# Patient Record
Sex: Female | Born: 1990 | Hispanic: Yes | Marital: Single | Smoking: Never smoker
Health system: Southern US, Community
[De-identification: ages and names within clinical notes are randomized; demographics above are authoritative.]

## PROBLEM LIST (undated history)

## (undated) ENCOUNTER — Inpatient Hospital Stay (HOSPITAL_COMMUNITY): Payer: Self-pay

## (undated) DIAGNOSIS — R519 Headache, unspecified: Secondary | ICD-10-CM

## (undated) DIAGNOSIS — K219 Gastro-esophageal reflux disease without esophagitis: Secondary | ICD-10-CM

## (undated) DIAGNOSIS — F329 Major depressive disorder, single episode, unspecified: Secondary | ICD-10-CM

## (undated) DIAGNOSIS — E559 Vitamin D deficiency, unspecified: Secondary | ICD-10-CM

## (undated) DIAGNOSIS — F32A Depression, unspecified: Secondary | ICD-10-CM

## (undated) DIAGNOSIS — E785 Hyperlipidemia, unspecified: Secondary | ICD-10-CM

## (undated) DIAGNOSIS — M549 Dorsalgia, unspecified: Secondary | ICD-10-CM

## (undated) DIAGNOSIS — G43011 Migraine without aura, intractable, with status migrainosus: Secondary | ICD-10-CM

## (undated) DIAGNOSIS — B999 Unspecified infectious disease: Secondary | ICD-10-CM

## (undated) DIAGNOSIS — G43019 Migraine without aura, intractable, without status migrainosus: Secondary | ICD-10-CM

## (undated) DIAGNOSIS — J309 Allergic rhinitis, unspecified: Secondary | ICD-10-CM

## (undated) DIAGNOSIS — M419 Scoliosis, unspecified: Secondary | ICD-10-CM

## (undated) DIAGNOSIS — L309 Dermatitis, unspecified: Secondary | ICD-10-CM

## (undated) DIAGNOSIS — Z3480 Encounter for supervision of other normal pregnancy, unspecified trimester: Secondary | ICD-10-CM

## (undated) HISTORY — DX: Migraine without aura, intractable, with status migrainosus: G43.011

## (undated) HISTORY — DX: Allergic rhinitis, unspecified: J30.9

## (undated) HISTORY — DX: Headache, unspecified: R51.9

## (undated) HISTORY — DX: Dorsalgia, unspecified: M54.9

## (undated) HISTORY — DX: Vitamin D deficiency, unspecified: E55.9

## (undated) HISTORY — DX: Major depressive disorder, single episode, unspecified: F32.9

## (undated) HISTORY — DX: Hyperlipidemia, unspecified: E78.5

## (undated) HISTORY — DX: Migraine without aura, intractable, without status migrainosus: G43.019

## (undated) HISTORY — DX: Scoliosis, unspecified: M41.9

## (undated) HISTORY — DX: Depression, unspecified: F32.A

---
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
## 2010-01-18 ENCOUNTER — Other Ambulatory Visit: Admission: RE | Admit: 2010-01-18 | Discharge: 2010-01-18 | Payer: Self-pay | Admitting: Obstetrics and Gynecology

## 2010-08-08 ENCOUNTER — Inpatient Hospital Stay (HOSPITAL_COMMUNITY): Admission: AD | Admit: 2010-08-08 | Discharge: 2010-08-10 | Payer: Self-pay | Admitting: Obstetrics and Gynecology

## 2010-11-23 LAB — CBC
HCT: 32 % — ABNORMAL LOW (ref 36.0–46.0)
MCH: 31.6 pg (ref 26.0–34.0)
MCHC: 34.1 g/dL (ref 30.0–36.0)
MCHC: 34.7 g/dL (ref 30.0–36.0)
Platelets: 162 10*3/uL (ref 150–400)
RDW: 13.7 % (ref 11.5–15.5)
WBC: 12.2 10*3/uL — ABNORMAL HIGH (ref 4.0–10.5)
WBC: 19.7 10*3/uL — ABNORMAL HIGH (ref 4.0–10.5)

## 2010-11-23 LAB — RPR: RPR Ser Ql: NONREACTIVE

## 2014-01-13 ENCOUNTER — Emergency Department (HOSPITAL_COMMUNITY)
Admission: EM | Admit: 2014-01-13 | Discharge: 2014-01-13 | Disposition: A | Discharge status: Home / Self Care | Payer: Medicaid Other | Attending: Emergency Medicine | Admitting: Emergency Medicine

## 2014-01-13 ENCOUNTER — Encounter (HOSPITAL_COMMUNITY): Payer: Self-pay | Admitting: Emergency Medicine

## 2014-01-13 DIAGNOSIS — R109 Unspecified abdominal pain: Secondary | ICD-10-CM

## 2014-01-13 DIAGNOSIS — N390 Urinary tract infection, site not specified: Secondary | ICD-10-CM | POA: Insufficient documentation

## 2014-01-13 DIAGNOSIS — Z3202 Encounter for pregnancy test, result negative: Secondary | ICD-10-CM | POA: Insufficient documentation

## 2014-01-13 LAB — CBC WITH DIFFERENTIAL/PLATELET
BASOS ABS: 0 10*3/uL (ref 0.0–0.1)
Basophils Relative: 0 % (ref 0–1)
EOS PCT: 1 % (ref 0–5)
Eosinophils Absolute: 0.1 10*3/uL (ref 0.0–0.7)
HCT: 36.8 % (ref 36.0–46.0)
HEMOGLOBIN: 12.7 g/dL (ref 12.0–15.0)
LYMPHS ABS: 2.4 10*3/uL (ref 0.7–4.0)
Lymphocytes Relative: 15 % (ref 12–46)
MCH: 31.7 pg (ref 26.0–34.0)
MCHC: 34.5 g/dL (ref 30.0–36.0)
MCV: 91.8 fL (ref 78.0–100.0)
Monocytes Absolute: 0.9 10*3/uL (ref 0.1–1.0)
Monocytes Relative: 6 % (ref 3–12)
NEUTROS ABS: 12.6 10*3/uL — AB (ref 1.7–7.7)
Neutrophils Relative %: 78 % — ABNORMAL HIGH (ref 43–77)
PLATELETS: 220 10*3/uL (ref 150–400)
RBC: 4.01 MIL/uL (ref 3.87–5.11)
RDW: 12.5 % (ref 11.5–15.5)
WBC: 15.9 10*3/uL — AB (ref 4.0–10.5)

## 2014-01-13 LAB — WET PREP, GENITAL
TRICH WET PREP: NONE SEEN
YEAST WET PREP: NONE SEEN

## 2014-01-13 LAB — URINE MICROSCOPIC-ADD ON

## 2014-01-13 LAB — COMPREHENSIVE METABOLIC PANEL
ALK PHOS: 57 U/L (ref 39–117)
ALT: 11 U/L (ref 0–35)
AST: 13 U/L (ref 0–37)
Albumin: 3.9 g/dL (ref 3.5–5.2)
BUN: 14 mg/dL (ref 6–23)
CALCIUM: 8.9 mg/dL (ref 8.4–10.5)
CO2: 24 meq/L (ref 19–32)
Chloride: 106 mEq/L (ref 96–112)
Creatinine, Ser: 0.52 mg/dL (ref 0.50–1.10)
GFR calc Af Amer: >90 mL/min (ref >90)
GFR calc non Af Amer: >90 mL/min (ref >90)
GLUCOSE: 94 mg/dL (ref 70–99)
Potassium: 3.7 mEq/L (ref 3.7–5.3)
SODIUM: 141 meq/L (ref 137–147)
Total Bilirubin: 0.5 mg/dL (ref 0.3–1.2)
Total Protein: 7.5 g/dL (ref 6.0–8.3)

## 2014-01-13 LAB — URINALYSIS, ROUTINE W REFLEX MICROSCOPIC
BILIRUBIN URINE: NEGATIVE
GLUCOSE, UA: NEGATIVE mg/dL
KETONES UR: NEGATIVE mg/dL
NITRITE: NEGATIVE
PH: 5.5 (ref 5.0–8.0)
Protein, ur: NEGATIVE mg/dL
Specific Gravity, Urine: 1.028 (ref 1.005–1.030)
UROBILINOGEN UA: 0.2 mg/dL (ref 0.0–1.0)

## 2014-01-13 LAB — POC URINE PREG, ED: Preg Test, Ur: NEGATIVE

## 2014-01-13 MED ORDER — IBUPROFEN 800 MG PO TABS
800.0000 mg | ORAL_TABLET | Freq: Once | ORAL | Status: AC
  Administered 2014-01-13: 800 mg via ORAL
  Filled 2014-01-13: qty 1

## 2014-01-13 MED ORDER — CEPHALEXIN 500 MG PO CAPS: 500.0000 mg | ORAL_CAPSULE | Freq: Four times a day (QID) | ORAL | Status: DC

## 2014-01-13 NOTE — Discharge Instructions (Signed)
Take antibiotic to completion for the urinary tract infection. You may take ibuprofen, 600 mg every 6 hours for pain. Follow up with the wellness clinic to establish care with a primary care doctor.   Urinary Tract Infection Urinary tract infections (UTIs) can develop anywhere along your urinary tract. Your urinary tract is your body's drainage system for removing wastes and extra water. Your urinary tract includes two kidneys, two ureters, a bladder, and a urethra. Your kidneys are a pair of bean-shaped organs. Each kidney is about the size of your fist. They are located below your ribs, one on each side of your spine. CAUSES Infections are caused by microbes, which are microscopic organisms, including fungi, viruses, and bacteria. These organisms are so small that they can only be seen through a microscope. Bacteria are the microbes that most commonly cause UTIs. SYMPTOMS  Symptoms of UTIs may vary by age and gender of the patient and by the location of the infection. Symptoms in young women typically include a frequent and intense urge to urinate and a painful, burning feeling in the bladder or urethra during urination. Older women and men are more likely to be tired, shaky, and weak and have muscle aches and abdominal pain. A fever may mean the infection is in your kidneys. Other symptoms of a kidney infection include pain in your back or sides below the ribs, nausea, and vomiting. DIAGNOSIS To diagnose a UTI, your caregiver will ask you about your symptoms. Your caregiver also will ask to provide a urine sample. The urine sample will be tested for bacteria and white blood cells. White blood cells are made by your body to help fight infection. TREATMENT  Typically, UTIs can be treated with medication. Because most UTIs are caused by a bacterial infection, they usually can be treated with the use of antibiotics. The choice of antibiotic and length of treatment depend on your symptoms and the type of  bacteria causing your infection. HOME CARE INSTRUCTIONS  If you were prescribed antibiotics, take them exactly as your caregiver instructs you. Finish the medication even if you feel better after you have only taken some of the medication.  Drink enough water and fluids to keep your urine clear or pale yellow.  Avoid caffeine, tea, and carbonated beverages. They tend to irritate your bladder.  Empty your bladder often. Avoid holding urine for long periods of time.  Empty your bladder before and after sexual intercourse.  After a bowel movement, women should cleanse from front to back. Use each tissue only once. SEEK MEDICAL CARE IF:   You have back pain.  You develop a fever.  Your symptoms do not begin to resolve within 3 days. SEEK IMMEDIATE MEDICAL CARE IF:   You have severe back pain or lower abdominal pain.  You develop chills.  You have nausea or vomiting.  You have continued burning or discomfort with urination. MAKE SURE YOU:   Understand these instructions.  Will watch your condition.  Will get help right away if you are not doing well or get worse. Document Released: 06/08/2005 Document Revised: 02/28/2012 Document Reviewed: 10/07/2011 [facility] Patient Information 2014 [place], [place].  Abdominal Pain, Women Abdominal (stomach, pelvic, or belly) pain can be caused by many things. It is important to tell your doctor:  The location of the pain.  Does it come and go or is it present all the time?  Are there things that start the pain (eating certain foods, exercise)?  Are there other symptoms associated  with the pain (fever, nausea, vomiting, diarrhea)? All of this is helpful to know when trying to find the cause of the pain. CAUSES   Stomach: virus or bacteria infection, or ulcer.  Intestine: appendicitis (inflamed appendix), regional ileitis (Crohn's disease), ulcerative colitis (inflamed colon), irritable bowel syndrome, diverticulitis (inflamed  diverticulum of the colon), or cancer of the stomach or intestine.  Gallbladder disease or stones in the gallbladder.  Kidney disease, kidney stones, or infection.  Pancreas infection or cancer.  Fibromyalgia (pain disorder).  Diseases of the female organs:  Uterus: fibroid (non-cancerous) tumors or infection.  Fallopian tubes: infection or tubal pregnancy.  Ovary: cysts or tumors.  Pelvic adhesions (scar tissue).  Endometriosis (uterus lining tissue growing in the pelvis and on the pelvic organs).  Pelvic congestion syndrome (female organs filling up with blood just before the menstrual period).  Pain with the menstrual period.  Pain with ovulation (producing an egg).  Pain with an IUD (intrauterine device, birth control) in the uterus.  Cancer of the female organs.  Functional pain (pain not caused by a disease, may improve without treatment).  Psychological pain.  Depression. DIAGNOSIS  Your doctor will decide the seriousness of your pain by doing an examination.  Blood tests.  X-rays.  Ultrasound.  CT scan (computed tomography, special type of X-ray).  MRI (magnetic resonance imaging).  Cultures, for infection.  Barium enema (dye inserted in the large intestine, to better view it with X-rays).  Colonoscopy (looking in intestine with a lighted tube).  Laparoscopy (minor surgery, looking in abdomen with a lighted tube).  Major abdominal exploratory surgery (looking in abdomen with a large incision). TREATMENT  The treatment will depend on the cause of the pain.   Many cases can be observed and treated at home.  Over-the-counter medicines recommended by your caregiver.  Prescription medicine.  Antibiotics, for infection.  Birth control pills, for painful periods or for ovulation pain.  Hormone treatment, for endometriosis.  Nerve blocking injections.  Physical therapy.  Antidepressants.  Counseling with a psychologist or  psychiatrist.  Minor or major surgery. HOME CARE INSTRUCTIONS   Do not take laxatives, unless directed by your caregiver.  Take over-the-counter pain medicine only if ordered by your caregiver. Do not take aspirin because it can cause an upset stomach or bleeding.  Try a clear liquid diet (broth or water) as ordered by your caregiver. Slowly move to a bland diet, as tolerated, if the pain is related to the stomach or intestine.  Have a thermometer and take your temperature several times a day, and record it.  Bed rest and sleep, if it helps the pain.  Avoid sexual intercourse, if it causes pain.  Avoid stressful situations.  Keep your follow-up appointments and tests, as your caregiver orders.  If the pain does not go away with medicine or surgery, you may try:  Acupuncture.  Relaxation exercises (yoga, meditation).  Group therapy.  Counseling. SEEK MEDICAL CARE IF:   You notice certain foods cause stomach pain.  Your home care treatment is not helping your pain.  You need stronger pain medicine.  You want your IUD removed.  You feel faint or lightheaded.  You develop nausea and vomiting.  You develop a rash.  You are having side effects or an allergy to your medicine. SEEK IMMEDIATE MEDICAL CARE IF:   Your pain does not go away or gets worse.  You have a fever.  Your pain is felt only in portions of the abdomen. The right  side could possibly be appendicitis. The left lower portion of the abdomen could be colitis or diverticulitis.  You are passing blood in your stools (bright red or black tarry stools, with or without vomiting).  You have blood in your urine.  You develop chills, with or without a fever.  You pass out. MAKE SURE YOU:   Understand these instructions.  Will watch your condition.  Will get help right away if you are not doing well or get worse. Document Released: 06/26/2007 Document Revised: 11/21/2011 Document Reviewed:  07/16/2009 [facility] Patient Information 2014 [place]

## 2014-01-13 NOTE — ED Notes (Signed)
C/o abd/pelvic cramping, onset this am at 0600, rates 8/10, "feels a little better now", no meds PTA, (denies: nvd, fever, dizziness or back pain), admits to some sob at onset this am. alert, NAD, calm, interactive, resps e/u, speaking in clear complete sentences, VSS, steady gait, no dyspnea noted. Last ate 2000 last night, last BM yesterday (normal). LMP 1st week in April.

## 2014-01-13 NOTE — ED Provider Notes (Signed)
CSN: [id number]     Arrival date & time 01/13/14  0711 History   First MD Initiated Contact with Patient 01/13/14 [id number]     Chief Complaint  Patient presents with  . Abdominal Cramping     (Consider location/radiation/quality/duration/timing/severity/associated sxs/prior Treatment) HPI Comments: Patient is a 23 year old female with no significant past medical history presents to the emergency department complaining of gradual onset lower abdominal and pelvic cramping beginning about an hour and half prior to arrival around 6:00 this morning. Pain has been intermittent, nothing in specific making it come or go, rated 8/10. Currently states her pain has slightly improved to a 6/10 without any intervention. States she has increased urinary frequency and urgency without dysuria or hematuria. No vaginal discharge. Last menstrual period began at the beginning of last month and was normal. Denies fever, chills, nausea or vomiting. She is sexually active with one partner, she has not had a different partner since January. Admits to rare protection. States she had one episode of diarrhea, otherwise normal bowel movements. No history of abdominal surgeries.  Patient is a 23 y.o. female presenting with cramps. The history is provided by the patient.  Abdominal Cramping   History reviewed. No pertinent past medical history. History reviewed. No pertinent past surgical history. No family history on file. History  Substance Use Topics  . Smoking status: Never Smoker   . Smokeless tobacco: Not on file  . Alcohol Use: No   OB History   Grav Para Term Preterm Abortions TAB SAB Ect Mult Living                 Review of Systems  Gastrointestinal: Positive for abdominal pain.  Genitourinary: Positive for urgency, frequency and pelvic pain.  All other systems reviewed and are negative.     Allergies  Review of patient's allergies indicates no known allergies.  Home Medications   Prior to  Admission medications   Not on File   BP 117/66  Pulse 102  Temp(Src) 99.1 F (37.3 C) (Oral)  Resp 18  SpO2 99%  LMP 12/14/2013 Physical Exam  Nursing note and vitals reviewed. Constitutional: She is oriented to person, place, and time. She appears well-developed and well-nourished. No distress.  HENT:  Head: Normocephalic and atraumatic.  Mouth/Throat: Oropharynx is clear and moist.  Eyes: Conjunctivae are normal.  Neck: Normal range of motion. Neck supple.  Cardiovascular: Normal rate, regular rhythm and normal heart sounds.   Pulmonary/Chest: Effort normal and breath sounds normal.  Abdominal: Soft. Normal appearance and bowel sounds are normal. There is tenderness in the suprapubic area. There is no rigidity, no rebound, no guarding, no CVA tenderness, no tenderness at McBurney's point and negative Murphy's sign.    No peritoneal signs.  Genitourinary: Uterus normal. Cervix exhibits no motion tenderness, no discharge and no friability. Right adnexum displays no mass, no tenderness and no fullness. Left adnexum displays no mass, no tenderness and no fullness. No erythema, tenderness or bleeding around the vagina. Vaginal discharge (white, malodorous) found.  Musculoskeletal: Normal range of motion. She exhibits no edema.  Neurological: She is alert and oriented to person, place, and time.  Skin: Skin is warm and dry. She is not diaphoretic.  Psychiatric: She has a normal mood and affect. Her behavior is normal.    ED Course  Procedures (including critical care time) Labs Review Labs Reviewed  WET PREP, GENITAL - Abnormal; Notable for the following:    Clue Cells Wet Prep HPF POC FEW (*)  WBC, Wet Prep HPF POC FEW (*)    All other components within normal limits  CBC WITH DIFFERENTIAL - Abnormal; Notable for the following:    WBC 15.9 (*)    Neutrophils Relative % 78 (*)    Neutro Abs 12.6 (*)    All other components within normal limits  URINALYSIS, ROUTINE W REFLEX  MICROSCOPIC - Abnormal; Notable for the following:    APPearance CLOUDY (*)    Hgb urine dipstick TRACE (*)    Leukocytes, UA LARGE (*)    All other components within normal limits  URINE MICROSCOPIC-ADD ON - Abnormal; Notable for the following:    Bacteria, UA MANY (*)    All other components within normal limits  GC/CHLAMYDIA PROBE AMP  COMPREHENSIVE METABOLIC PANEL  POC URINE PREG, ED    Imaging Review No results found.   EKG Interpretation None      MDM   Final diagnoses:  UTI (urinary tract infection)  Abdominal pain    Patient presenting with lower abdominal and pelvic cramping, urinary symptoms. She is well appearing and in no apparent distress. It is noted she was tachycardic at 102 in triage, no tachycardia on my exam. Labs pending. Will do pelvic exam. 9:27 AM Urine positive for UTI. Leukocytosis of 15.9. Pt remains well appearing. Symptoms have not worsened. Repeat abdominal exam, abdomen soft, suprapubic tenderness noted. No CVA tenderness. No peritoneal signs. Will treat with keflex. Stable for d/c. Return precautions given. Patient states understanding of treatment care plan and is agreeable.   Trevor MaceRobyn M Albert, PA-C 01/13/14 [phone]

## 2014-01-13 NOTE — ED Notes (Signed)
States has low abd pain started this am, pain 6/10 at present. abd tender on palpation. Also states has urinary frequency.

## 2014-01-13 NOTE — ED Provider Notes (Signed)
screening examination/treatment/procedure(s) were conducted as a shared visit with non-physician practitioner(s) or resident and myself. I personally evaluated the patient during the encounter and agree with the findings and plan unless otherwise indicated.  I have personally reviewed any xrays and/ or EKG's with the provider and I agree with interpretation.  23 year old female with no significant medical history percent lower abdominal cramping, nonradiating since this morning. Patient has mild urinary symptoms and denies vaginal symptoms. No fevers or chills. On exam well-appearing, mild suprapubic tenderness, abdomen soft, no guarding, no flank pain. Urinalysis consistent with infection. Vaginal exam unremarkable per PA. Plan for oral antibiotics and followup outpatient.  UTI, suprapubic pain \  Enid SkeensJoshua M Dorraine Ellender, MD 01/13/14 [phone]

## 2014-01-14 LAB — GC/CHLAMYDIA PROBE AMP
CT Probe RNA: POSITIVE — AB
GC Probe RNA: NEGATIVE

## 2014-01-15 ENCOUNTER — Telehealth (HOSPITAL_BASED_OUTPATIENT_CLINIC_OR_DEPARTMENT_OTHER): Payer: Self-pay | Admitting: Emergency Medicine

## 2014-01-15 NOTE — Telephone Encounter (Signed)
+  Chlamydia Chart sent to EDP office for review.  

## 2014-08-02 ENCOUNTER — Encounter (HOSPITAL_COMMUNITY): Payer: Self-pay | Admitting: Emergency Medicine

## 2014-08-02 ENCOUNTER — Emergency Department (HOSPITAL_COMMUNITY)
Admission: EM | Admit: 2014-08-02 | Discharge: 2014-08-03 | Disposition: A | Discharge status: Home / Self Care | Payer: Medicaid Other | Attending: Emergency Medicine | Admitting: Emergency Medicine

## 2014-08-02 DIAGNOSIS — G43909 Migraine, unspecified, not intractable, without status migrainosus: Secondary | ICD-10-CM | POA: Insufficient documentation

## 2014-08-02 DIAGNOSIS — R51 Headache: Secondary | ICD-10-CM | POA: Diagnosis present

## 2014-08-02 MED ORDER — DIPHENHYDRAMINE HCL 50 MG/ML IJ SOLN
12.5000 mg | Freq: Once | INTRAMUSCULAR | Status: AC
  Administered 2014-08-02: 12.5 mg via INTRAVENOUS
  Filled 2014-08-02: qty 1

## 2014-08-02 MED ORDER — SODIUM CHLORIDE 0.9 % IV BOLUS (SEPSIS)
1000.0000 mL | Freq: Once | INTRAVENOUS | Status: AC
  Administered 2014-08-02: 1000 mL via INTRAVENOUS

## 2014-08-02 MED ORDER — PROCHLORPERAZINE EDISYLATE 5 MG/ML IJ SOLN
10.0000 mg | Freq: Once | INTRAMUSCULAR | Status: AC
  Administered 2014-08-02: 10 mg via INTRAVENOUS
  Filled 2014-08-02: qty 2

## 2014-08-02 MED ORDER — KETOROLAC TROMETHAMINE 30 MG/ML IJ SOLN
30.0000 mg | Freq: Once | INTRAMUSCULAR | Status: AC
  Administered 2014-08-02: 30 mg via INTRAVENOUS
  Filled 2014-08-02: qty 1

## 2014-08-02 NOTE — ED Notes (Signed)
Patient arrives with complaint of migraine type headache. States that she has been having these headaches for approximately 3 year about every other day. Has been seen by PCP and prescribed numerous medications with minimal effect. Endorses history of nausea, emesis, aura, dizziness, numbness, and weakness with migraines in the past; but none of those symptoms are present tonight. States that she discontinued taking Rx medications and has been taking only OTC medications recently. Took "Goody's Powder" around 2000 tonight and states that headache is starting to resolve.

## 2014-08-02 NOTE — ED Provider Notes (Signed)
CSN: [id number]     Arrival date & time 08/02/14  2203 History   First MD Initiated Contact with Patient 08/02/14 2241     Chief Complaint  Patient presents with  . Headache     (Consider location/radiation/quality/duration/timing/severity/associated sxs/prior Treatment) Patient is a 23 y.o. female presenting with headaches. The history is provided by the patient and medical records. No language interpreter was used.  Headache Associated symptoms: photophobia   Associated symptoms: no abdominal pain, no back pain, no cough, no diarrhea, no fatigue, no fever, no nausea, no neck stiffness and no vomiting      Courtney [name] is a 23 y.o. female  with a hx of migraine headache presents to the Emergency Department complaining of gradual, persistent, progressively improving generalized, throbbing headache onset 1 PM today.  She reports that she gets migraine headaches similar to today approximately every other day for the last 3 years. She reports she's been seen by her primary care physician and prescribed numerous medications with minimal effects. She reports that in the past she's had nausea, vomiting, dizziness and weakness but none of these symptoms are present tonight. She reports she does have associated photophobia with tonight's headache. She reports taking Goody's powder at approximately 8 PM with significant improvement in headache. She denies vision changes, numbness, tingling, syncope, fever, chills, neck stiffness.  Patient reports that this headache is unchanged from previous migraines.  History reviewed. No pertinent past medical history. History reviewed. No pertinent past surgical history. No family history on file. History  Substance Use Topics  . Smoking status: Never Smoker   . Smokeless tobacco: Not on file  . Alcohol Use: No   OB History    No data available     Review of Systems  Constitutional: Negative for fever, diaphoresis, appetite change, fatigue and  unexpected weight change.  HENT: Negative for mouth sores.   Eyes: Positive for photophobia. Negative for visual disturbance.  Respiratory: Negative for cough, chest tightness, shortness of breath and wheezing.   Cardiovascular: Negative for chest pain.  Gastrointestinal: Negative for nausea, vomiting, abdominal pain, diarrhea and constipation.  Endocrine: Negative for polydipsia, polyphagia and polyuria.  Genitourinary: Negative for dysuria, urgency, frequency and hematuria.  Musculoskeletal: Negative for back pain and neck stiffness.  Skin: Negative for rash.  Allergic/Immunologic: Negative for immunocompromised state.  Neurological: Positive for headaches. Negative for syncope and light-headedness.  Hematological: Does not bruise/bleed easily.  Psychiatric/Behavioral: Negative for sleep disturbance. The patient is not nervous/anxious.       Allergies  Review of patient's allergies indicates no known allergies.  Home Medications   Prior to Admission medications   Medication Sig Start Date End Date Taking? Authorizing Provider  Aspirin-Salicylamide-Caffeine (BC HEADACHE POWDER PO) Take 1 packet by mouth every 6 (six) hours as needed (for headache).   Yes Historical Provider, MD  ibuprofen (ADVIL,MOTRIN) 400 MG tablet Take 400 mg by mouth every 6 (six) hours as needed for mild pain or moderate pain.   Yes Historical Provider, MD  cephALEXin (KEFLEX) 500 MG capsule Take 1 capsule (500 mg total) by mouth 4 (four) times daily. Patient not taking: Reported on 08/02/2014 01/13/14   Nada Boozerobyn M Hess, PA-C   BP 123/77 mmHg  Pulse 97  Temp(Src) 98.4 F (36.9 C) (Oral)  Resp 18  Ht 5\' 5"  (1.651 m)  Wt 165 lb (74.844 kg)  BMI 27.46 kg/m2  SpO2 99%  LMP 07/19/2014 Physical Exam  Constitutional: She is oriented to person, place, and time.  She appears well-developed and well-nourished. No distress.  Awake, alert, nontoxic appearance  HENT:  Head: Normocephalic and atraumatic.  Mouth/Throat:  Oropharynx is clear and moist. No oropharyngeal exudate.  Eyes: Conjunctivae and EOM are normal. Pupils are equal, round, and reactive to light. No scleral icterus.  No horizontal, vertical or rotational nystagmus  Neck: Normal range of motion. Neck supple.  Full active and passive ROM without pain No midline or paraspinal tenderness No nuchal rigidity or meningeal signs  Cardiovascular: Normal rate, regular rhythm, normal heart sounds and intact distal pulses.   No murmur heard. Pulmonary/Chest: Effort normal and breath sounds normal. No respiratory distress. She has no wheezes. She has no rales.  Equal chest expansion  Abdominal: Soft. Bowel sounds are normal. She exhibits no mass. There is no tenderness. There is no rebound and no guarding.  Musculoskeletal: Normal range of motion. She exhibits no edema.  Lymphadenopathy:    She has no cervical adenopathy.  Neurological: She is alert and oriented to person, place, and time. She has normal reflexes. No cranial nerve deficit. She exhibits normal muscle tone. Coordination normal.  Mental Status:  Alert, oriented, thought content appropriate. Speech fluent without evidence of aphasia. Able to follow 2 step commands without difficulty.  Cranial Nerves:  II:  Peripheral visual fields grossly normal, pupils equal, round, reactive to light III,IV, VI: ptosis not present, extra-ocular motions intact bilaterally  V,VII: smile symmetric, facial light touch sensation equal VIII: hearing grossly normal bilaterally  IX,X: gag reflex present  XI: bilateral shoulder shrug equal and strong XII: midline tongue extension  Motor:  5/5 in upper and lower extremities bilaterally including strong and equal grip strength and dorsiflexion/plantar flexion Sensory: Pinprick and light touch normal in all extremities.  Deep Tendon Reflexes: 2+ and symmetric  Cerebellar: normal finger-to-nose with bilateral upper extremities Gait: normal gait and balance CV:  distal pulses palpable throughout   Skin: Skin is warm and dry. No rash noted. She is not diaphoretic.  Psychiatric: She has a normal mood and affect. Her behavior is normal. Judgment and thought content normal.  Nursing note and vitals reviewed.   ED Course  Procedures (including critical care time) Labs Review Labs Reviewed - No data to display  Imaging Review No results found.   EKG Interpretation None      MDM   Final diagnoses:  Migraine without status migrainosus, not intractable, unspecified migraine type   Courtney [name] presents with migraine unchanged from previous headaches. Patient reports her headache is improving.  Will give migraine cocktail and reassess.  12:13 AM Pt HA treated and resolved while in ED.  Presentation is like pts typical HA and non concerning for [facility], ICH, Meningitis, or temporal arteritis. Pt is afebrile with no focal neuro deficits, nuchal rigidity, or change in vision. Pt is to follow up with PCP to discuss prophylactic medication. Pt verbalizes understanding and is agreeable with plan to dc.   I have personally reviewed patient's vitals, nursing note and any pertinent labs or imaging.  I performed an undressed physical exam.    It has been determined that no acute conditions requiring further emergency intervention are present at this time. The patient/guardian have been advised of the diagnosis and plan. I reviewed all labs and imaging including any potential incidental findings. We have discussed signs and symptoms that warrant return to the ED and they are listed in the discharge instructions.    Vital signs are stable at discharge.   BP 123/77 mmHg  Pulse 97  Temp(Src) 98.4 F (36.9 C) (Oral)  Resp 18  Ht 5\' 5"  (1.651 m)  Wt 165 lb (74.844 kg)  BMI 27.46 kg/m2  SpO2 99%  LMP 07/19/2014         Dierdre ForthHannah Abigail Marsiglia, PA-C 08/03/14 0013  Suzi RootsKevin E Steinl, MD 08/03/14 [phone]

## 2014-08-02 NOTE — ED Notes (Signed)
Pt. reports intermittent headache with mild nausea for several days worse today , denies emesis , no fever or chills.

## 2014-08-03 NOTE — Discharge Instructions (Signed)
1. Medications: usual home medications 2. Treatment: rest, drink plenty of fluids,  3. Follow Up: Please followup with your primary doctor in 3 days for discussion of your diagnoses and further evaluation after today's visit; if you do not have a primary care doctor use the resource guide provided to find one; Please return to the ER for worsening symptoms, changes in vision associated with a headache, persistent vomiting, fevers or other concerns   Migraine Headache A migraine headache is an intense, throbbing pain on one or both sides of your head. A migraine can last for 30 minutes to several hours. CAUSES  The exact cause of a migraine headache is not always known. However, a migraine may be caused when nerves in the brain become irritated and release chemicals that cause inflammation. This causes pain. Certain things may also trigger migraines, such as:  Alcohol.  Smoking.  Stress.  Menstruation.  Aged cheeses.  Foods or drinks that contain nitrates, glutamate, aspartame, or tyramine.  Lack of sleep.  Chocolate.  Caffeine.  Hunger.  Physical exertion.  Fatigue.  Medicines used to treat chest pain (nitroglycerine), birth control pills, estrogen, and some blood pressure medicines. SIGNS AND SYMPTOMS  Pain on one or both sides of your head.  Pulsating or throbbing pain.  Severe pain that prevents daily activities.  Pain that is aggravated by any physical activity.  Nausea, vomiting, or both.  Dizziness.  Pain with exposure to bright lights, loud noises, or activity.  General sensitivity to bright lights, loud noises, or smells. Before you get a migraine, you may get warning signs that a migraine is coming (aura). An aura may include:  Seeing flashing lights.  Seeing bright spots, halos, or zigzag lines.  Having tunnel vision or blurred vision.  Having feelings of numbness or tingling.  Having trouble talking.  Having muscle weakness. DIAGNOSIS  A  migraine headache is often diagnosed based on:  Symptoms.  Physical exam.  A CT scan or MRI of your head. These imaging tests cannot diagnose migraines, but they can help rule out other causes of headaches. TREATMENT Medicines may be given for pain and nausea. Medicines can also be given to help prevent recurrent migraines.  HOME CARE INSTRUCTIONS  Only take over-the-counter or prescription medicines for pain or discomfort as directed by your health care provider. The use of long-term narcotics is not recommended.  Lie down in a dark, quiet room when you have a migraine.  Keep a journal to find out what may trigger your migraine headaches. For example, write down:  What you eat and drink.  How much sleep you get.  Any change to your diet or medicines.  Limit alcohol consumption.  Quit smoking if you smoke.  Get 7-9 hours of sleep, or as recommended by your health care provider.  Limit stress.  Keep lights dim if bright lights bother you and make your migraines worse. SEEK IMMEDIATE MEDICAL CARE IF:   Your migraine becomes severe.  You have a fever.  You have a stiff neck.  You have vision loss.  You have muscular weakness or loss of muscle control.  You start losing your balance or have trouble walking.  You feel faint or pass out.  You have severe symptoms that are different from your first symptoms. MAKE SURE YOU:   Understand these instructions.  Will watch your condition.  Will get help right away if you are not doing well or get worse. Document Released: 08/29/2005 Document Revised: 01/13/2014 Document Reviewed:  05/06/2013 ExitCare Patient Information 2015 [employer] [place]. This information is not intended to replace advice given to you by your health care provider. Make sure you discuss any questions you have with your health care provider.

## 2014-08-03 NOTE — ED Notes (Signed)
Pt. Refused wheelchair 

## 2014-12-16 ENCOUNTER — Encounter: Payer: Self-pay | Admitting: Neurology

## 2014-12-16 ENCOUNTER — Ambulatory Visit (INDEPENDENT_AMBULATORY_CARE_PROVIDER_SITE_OTHER): Payer: Medicaid Other | Admitting: Neurology

## 2014-12-16 VITALS — BP 115/67 | HR 89 | Ht 62.0 in | Wt 165.8 lb

## 2014-12-16 DIAGNOSIS — G43019 Migraine without aura, intractable, without status migrainosus: Secondary | ICD-10-CM | POA: Diagnosis not present

## 2014-12-16 HISTORY — DX: Migraine without aura, intractable, without status migrainosus: G43.019

## 2014-12-16 MED ORDER — SUMATRIPTAN SUCCINATE 100 MG PO TABS: 100.0000 mg | ORAL_TABLET | Freq: Two times a day (BID) | ORAL | Status: DC | PRN

## 2014-12-16 MED ORDER — TOPIRAMATE 25 MG PO TABS: ORAL_TABLET | ORAL | Status: DC

## 2014-12-16 NOTE — Patient Instructions (Signed)

## 2014-12-16 NOTE — Progress Notes (Signed)
Reason for visit: Migraine  Referring physician: Dr. Cloyd Stagerssei Bonsu  Courtney [name] is a 24 y.o. female  History of present illness:  Courtney [name] is a 24 year old patient with frequent migraine headaches over the last 4 years. The patient indicates that the headaches are always in the left frontal area, and may last up to 2 days at a time. The patient indicates that she will have nausea and vomiting with the headache, and she will have photophobia and phonophobia. She will have spots in front eyes, no visual loss. She feels dizziness at times, she denies any numbness or weakness on the face, arms, or legs. She has been on a multitude of medications in the past, including Maxalt and hydrocodone. The patient indicates that she did not gain much benefit with these medications. She currently takes [facility] powders, which she takes infrequently, up to 12 a week. The patient will get some benefit from [facility] powders. Occasionally, she may need to leave work because the headache, this may happen up to the 2 times a month. She has at least 15 days with headache each month. The patient also drinks 2 caffeinated soft drinks daily. She was given a prescription for Topamax in the past, but she never took the medication. She comes to the office today for an evaluation. She has never had a scan of the brain. She denies any neck stiffness with the headache.  Past Medical History  Diagnosis Date  . Headache   . Back pain   . Scoliosis   . Vitamin D deficiency   . Allergic rhinitis   . Depression   . Common migraine with intractable migraine 12/16/2014    History reviewed. No pertinent past surgical history.  Family History  Problem Relation Age of Onset  . Migraines Mother   . Healthy Sister   . Healthy Brother   . Healthy Brother   . Healthy Brother     Social history:  reports that she has never smoked. She has never used smokeless tobacco. She reports that she drinks alcohol. She reports that she  does not use illicit drugs.  Medications:  Prior to Admission medications   Medication Sig Start Date End Date Taking? Authorizing Provider  Aspirin-Salicylamide-Caffeine (BC HEADACHE POWDER PO) Take by mouth as needed.   Yes Historical Provider, MD     No Known Allergies  ROS:  Out of a complete 14 system review of symptoms, the patient complains only of the following symptoms, and all other reviewed systems are negative.  Chest pain Dizziness Skin rash, itching, moles Cough Diarrhea, constipation Feeling hot Cramps, aching muscles Skin sensitivity Memory loss, headache, weakness, dizziness Depression, insomnia, decreased energy, racing thoughts Sleepiness  Blood pressure 115/67, pulse 89, height 5\' 2"  (1.575 m), weight 165 lb 12.8 oz (75.206 kg).  Physical Exam  General: The patient is alert and cooperative at the time of the examination. The patient has a flat affect.  Eyes: Pupils are equal, round, and reactive to light. Discs are flat bilaterally.  Neck: The neck is supple, no carotid bruits are noted.  Respiratory: The respiratory examination is clear.  Cardiovascular: The cardiovascular examination reveals a regular rate and rhythm, no obvious murmurs or rubs are noted.  Neuromuscular: Range of movement of the cervical spine was full. No crepitus is noted in the temporomandibular joints.  Skin: Extremities are without significant edema.  Neurologic Exam  Mental status: The patient is alert and oriented x 3 at the time of the  examination. The patient has apparent normal recent and remote memory, with an apparently normal attention span and concentration ability.  Cranial nerves: Facial symmetry is present. There is good sensation of the face to pinprick and soft touch bilaterally. The strength of the facial muscles and the muscles to head turning and shoulder shrug are normal bilaterally. Speech is well enunciated, no aphasia or dysarthria is noted. Extraocular  movements are full. Visual fields are full. The tongue is midline, and the patient has symmetric elevation of the soft palate. No obvious hearing deficits are noted.  Motor: The motor testing reveals 5 over 5 strength of all 4 extremities. Good symmetric motor tone is noted throughout.  Sensory: Sensory testing is intact to pinprick, soft touch, vibration sensation, and position sense on all 4 extremities. No evidence of extinction is noted.  Coordination: Cerebellar testing reveals good finger-nose-finger and heel-to-shin bilaterally.  Gait and station: Gait is normal. Tandem gait is normal. Romberg is negative. No drift is seen.  Reflexes: Deep tendon reflexes are symmetric and normal bilaterally. Toes are downgoing bilaterally.   Assessment/Plan:  1. Common migraine headache  The patient has a typical history for migraine. The patient likely is overusing caffeinated products including BC powders during the day. I have asked her to cut out the caffeine intake, but she indicates that other products such as Aleve or Advil have not been effective. The patient will be placed back on Topamax, going up on the dose. She will be given Imitrex to take if needed. She has been on Maxalt previously, but only at a 5 mg dose. She will follow up through this office in about 3 months. If the headaches do not abate, we will consider getting MRI evaluation of the brain.  Marlan Palau MD 12/16/2014 9:03 PM  [facility] [address] [place], [place] [address]  Phone [phone] Fax [phone]

## 2015-04-04 ENCOUNTER — Encounter (HOSPITAL_COMMUNITY): Payer: Self-pay | Admitting: Emergency Medicine

## 2015-04-04 DIAGNOSIS — W1839XA Other fall on same level, initial encounter: Secondary | ICD-10-CM

## 2015-04-04 DIAGNOSIS — O9A211 Injury, poisoning and certain other consequences of external causes complicating pregnancy, first trimester: Secondary | ICD-10-CM

## 2015-04-04 DIAGNOSIS — S3992XA Unspecified injury of lower back, initial encounter: Secondary | ICD-10-CM

## 2015-04-04 DIAGNOSIS — O9989 Other specified diseases and conditions complicating pregnancy, childbirth and the puerperium: Secondary | ICD-10-CM | POA: Insufficient documentation

## 2015-04-04 DIAGNOSIS — Z3A01 Less than 8 weeks gestation of pregnancy: Secondary | ICD-10-CM | POA: Diagnosis not present

## 2015-04-04 DIAGNOSIS — Y9389 Activity, other specified: Secondary | ICD-10-CM | POA: Insufficient documentation

## 2015-04-04 DIAGNOSIS — R55 Syncope and collapse: Secondary | ICD-10-CM

## 2015-04-04 DIAGNOSIS — S0990XA Unspecified injury of head, initial encounter: Secondary | ICD-10-CM | POA: Insufficient documentation

## 2015-04-04 DIAGNOSIS — Y998 Other external cause status: Secondary | ICD-10-CM | POA: Insufficient documentation

## 2015-04-04 DIAGNOSIS — O2341 Unspecified infection of urinary tract in pregnancy, first trimester: Secondary | ICD-10-CM | POA: Diagnosis not present

## 2015-04-04 DIAGNOSIS — Y9289 Other specified places as the place of occurrence of the external cause: Secondary | ICD-10-CM

## 2015-04-04 DIAGNOSIS — T671XXD Heat syncope, subsequent encounter: Secondary | ICD-10-CM | POA: Diagnosis not present

## 2015-04-04 DIAGNOSIS — R109 Unspecified abdominal pain: Secondary | ICD-10-CM | POA: Diagnosis present

## 2015-04-04 LAB — CBC WITH DIFFERENTIAL/PLATELET
BASOS PCT: 0 % (ref 0–1)
Basophils Absolute: 0 10*3/uL (ref 0.0–0.1)
EOS ABS: 0 10*3/uL (ref 0.0–0.7)
EOS PCT: 0 % (ref 0–5)
HCT: 37.4 % (ref 36.0–46.0)
Hemoglobin: 12.9 g/dL (ref 12.0–15.0)
LYMPHS ABS: 2.1 10*3/uL (ref 0.7–4.0)
Lymphocytes Relative: 20 % (ref 12–46)
MCH: 31 pg (ref 26.0–34.0)
MCHC: 34.5 g/dL (ref 30.0–36.0)
MCV: 89.9 fL (ref 78.0–100.0)
MONOS PCT: 4 % (ref 3–12)
Monocytes Absolute: 0.4 10*3/uL (ref 0.1–1.0)
NEUTROS PCT: 76 % (ref 43–77)
Neutro Abs: 7.7 10*3/uL (ref 1.7–7.7)
Platelets: 227 10*3/uL (ref 150–400)
RBC: 4.16 MIL/uL (ref 3.87–5.11)
RDW: 12.5 % (ref 11.5–15.5)
WBC: 10.2 10*3/uL (ref 4.0–10.5)

## 2015-04-04 LAB — COMPREHENSIVE METABOLIC PANEL
ALK PHOS: 51 U/L (ref 38–126)
ALT: 14 U/L (ref 14–54)
AST: 15 U/L (ref 15–41)
Albumin: 4 g/dL (ref 3.5–5.0)
Anion gap: 8 (ref 5–15)
BUN: 10 mg/dL (ref 6–20)
CHLORIDE: 105 mmol/L (ref 101–111)
CO2: 21 mmol/L — AB (ref 22–32)
Calcium: 9.1 mg/dL (ref 8.9–10.3)
Creatinine, Ser: 0.55 mg/dL (ref 0.44–1.00)
GFR calc Af Amer: >60 mL/min (ref >60)
GLUCOSE: 134 mg/dL — AB (ref 65–99)
POTASSIUM: 3.7 mmol/L (ref 3.5–5.1)
SODIUM: 134 mmol/L — AB (ref 135–145)
Total Bilirubin: 0.4 mg/dL (ref 0.3–1.2)
Total Protein: 7.1 g/dL (ref 6.5–8.1)

## 2015-04-04 LAB — URINE MICROSCOPIC-ADD ON

## 2015-04-04 LAB — URINALYSIS, ROUTINE W REFLEX MICROSCOPIC
Bilirubin Urine: NEGATIVE
GLUCOSE, UA: NEGATIVE mg/dL
Ketones, ur: 15 mg/dL — AB
NITRITE: NEGATIVE
PROTEIN: 30 mg/dL — AB
Specific Gravity, Urine: 1.037 — ABNORMAL HIGH (ref 1.005–1.030)
UROBILINOGEN UA: 0.2 mg/dL (ref 0.0–1.0)
pH: 6 (ref 5.0–8.0)

## 2015-04-04 LAB — POC URINE PREG, ED: PREG TEST UR: POSITIVE — AB

## 2015-04-04 NOTE — ED Notes (Addendum)
Pt st's she had [place] in the [place] then had d syncopal episode.  Pt c/o pain in her lower back from the fall and also c/o headache. Pt st's she is 5 weeks preg

## 2015-04-05 ENCOUNTER — Encounter (HOSPITAL_COMMUNITY): Payer: Self-pay | Admitting: *Deleted

## 2015-04-05 ENCOUNTER — Inpatient Hospital Stay (HOSPITAL_COMMUNITY): Payer: Medicaid Other

## 2015-04-05 ENCOUNTER — Emergency Department (HOSPITAL_COMMUNITY)
Admission: EM | Admit: 2015-04-05 | Discharge: 2015-04-05 | Discharge status: Against Medical Advice | Payer: Medicaid Other | Source: Home / Self Care

## 2015-04-05 ENCOUNTER — Inpatient Hospital Stay (HOSPITAL_COMMUNITY)
Admission: AD | Admit: 2015-04-05 | Discharge: 2015-04-05 | Disposition: A | Discharge status: Home / Self Care | Payer: Medicaid Other | Source: Ambulatory Visit | Attending: Obstetrics and Gynecology | Admitting: Obstetrics and Gynecology

## 2015-04-05 DIAGNOSIS — O9989 Other specified diseases and conditions complicating pregnancy, childbirth and the puerperium: Secondary | ICD-10-CM | POA: Diagnosis not present

## 2015-04-05 DIAGNOSIS — Z3A01 Less than 8 weeks gestation of pregnancy: Secondary | ICD-10-CM | POA: Insufficient documentation

## 2015-04-05 DIAGNOSIS — R109 Unspecified abdominal pain: Secondary | ICD-10-CM | POA: Diagnosis not present

## 2015-04-05 DIAGNOSIS — O2341 Unspecified infection of urinary tract in pregnancy, first trimester: Secondary | ICD-10-CM | POA: Diagnosis not present

## 2015-04-05 DIAGNOSIS — O26899 Other specified pregnancy related conditions, unspecified trimester: Secondary | ICD-10-CM

## 2015-04-05 DIAGNOSIS — T671XXD Heat syncope, subsequent encounter: Secondary | ICD-10-CM | POA: Insufficient documentation

## 2015-04-05 LAB — URINALYSIS, ROUTINE W REFLEX MICROSCOPIC
BILIRUBIN URINE: NEGATIVE
Glucose, UA: NEGATIVE mg/dL
Ketones, ur: NEGATIVE mg/dL
NITRITE: NEGATIVE
Protein, ur: NEGATIVE mg/dL
SPECIFIC GRAVITY, URINE: 1.025 (ref 1.005–1.030)
Urobilinogen, UA: 0.2 mg/dL (ref 0.0–1.0)
pH: 6 (ref 5.0–8.0)

## 2015-04-05 LAB — URINE MICROSCOPIC-ADD ON

## 2015-04-05 LAB — CBC
HEMATOCRIT: 34 % — AB (ref 36.0–46.0)
Hemoglobin: 11.5 g/dL — ABNORMAL LOW (ref 12.0–15.0)
MCH: 30.7 pg (ref 26.0–34.0)
MCHC: 33.8 g/dL (ref 30.0–36.0)
MCV: 90.7 fL (ref 78.0–100.0)
Platelets: 208 10*3/uL (ref 150–400)
RBC: 3.75 MIL/uL — AB (ref 3.87–5.11)
RDW: 12.7 % (ref 11.5–15.5)
WBC: 7.6 10*3/uL (ref 4.0–10.5)

## 2015-04-05 LAB — HCG, QUANTITATIVE, PREGNANCY: hCG, Beta Chain, Quant, S: 7453 m[IU]/mL — ABNORMAL HIGH (ref <5)

## 2015-04-05 LAB — ABO/RH: ABO/RH(D): A POS

## 2015-04-05 MED ORDER — CEPHALEXIN 500 MG PO CAPS: 500.0000 mg | ORAL_CAPSULE | Freq: Four times a day (QID) | ORAL | Status: DC

## 2015-04-05 NOTE — Discharge Instructions (Signed)

## 2015-04-05 NOTE — MAU Provider Note (Signed)
History     CSN: [id number]  Arrival date and time: 04/05/15 4098   First Provider Initiated Contact with Patient 04/05/15 1128      Chief Complaint  Patient presents with  . Abdominal Pain  . Back Pain   HPI   [name] is a 24 y.o. female G2P1001 at [redacted]w[redacted]d presenting to MAU with abdominal pain and back pain. She had a syncopal episode yesterday while attending a festival.  She was at a festival all day yesterday; she was waiting in line for food, outside for over an hour. She started feeling dizzy and started seeing white spots and she fell back onto her back. She did not hit her head. A friend called 911 and she went to a hospital in [place]. She waited 5 hours there and left. She went to [facility] last night and waited 5 hours and left there as well.   She was not drinking fluids while she was outside at the festival.      [place] History    Gravida Para Term Preterm AB TAB SAB Ectopic Multiple Living   2 1 1       1       Past Medical History  Diagnosis Date  . Headache   . Back pain   . Scoliosis   . Vitamin D deficiency   . Allergic rhinitis   . Depression   . Common migraine with intractable migraine 12/16/2014    Past Surgical History  Procedure Laterality Date  . No past surgeries      Family History  Problem Relation Age of Onset  . Migraines Mother   . Healthy Sister   . Healthy Brother   . Healthy Brother   . Healthy Brother     History  Substance Use Topics  . Smoking status: Never Smoker   . Smokeless tobacco: Never Used  . Alcohol Use: No    Allergies: No Known Allergies  Prescriptions prior to admission  Medication Sig Dispense Refill Last Dose  . acetaminophen (TYLENOL) 325 MG tablet Take 650 mg by mouth every 6 (six) hours as needed.   04/04/2015 at Unknown time  . aspirin-acetaminophen-caffeine (EXCEDRIN MIGRAINE) 250-250-65 MG per tablet Take 1 tablet by mouth every 6 (six) hours as needed for headache.   prn  .  Aspirin-Salicylamide-Caffeine (BC HEADACHE POWDER PO) Take by mouth as needed.   Not Taking at Unknown time  . SUMAtriptan (IMITREX) 100 MG tablet Take 1 tablet (100 mg total) by mouth 2 (two) times daily as needed for migraine. (Patient not taking: Reported on 04/05/2015) 10 tablet 2 Not Taking at Unknown time  . topiramate (TOPAMAX) 25 MG tablet Take one tablet at night for one week, then take 2 tablets at night for one week, then take 3 tablets at night. (Patient not taking: Reported on 04/05/2015) 90 tablet 3 Not Taking at Unknown time   Results for orders placed or performed during the hospital encounter of 04/05/15 (from the past 48 hour(s))  Urinalysis, Routine w reflex microscopic (not at [facility])     Status: Abnormal   Collection Time: 04/05/15  9:45 AM  Result Value Ref Range   Color, Urine YELLOW YELLOW   APPearance CLEAR CLEAR   Specific Gravity, Urine 1.025 1.005 - 1.030   pH 6.0 5.0 - 8.0   Glucose, UA NEGATIVE NEGATIVE mg/dL   Hgb urine dipstick TRACE (A) NEGATIVE   Bilirubin Urine NEGATIVE NEGATIVE   Ketones, ur NEGATIVE NEGATIVE mg/dL  Protein, ur NEGATIVE NEGATIVE mg/dL   Urobilinogen, UA 0.2 0.0 - 1.0 mg/dL   Nitrite NEGATIVE NEGATIVE   Leukocytes, UA MODERATE (A) NEGATIVE  Urine microscopic-add on     Status: Abnormal   Collection Time: 04/05/15  9:45 AM  Result Value Ref Range   Squamous Epithelial / LPF FEW (A) RARE   WBC, UA 7-10 <3 WBC/hpf   RBC / HPF 3-6 <3 RBC/hpf   Bacteria, UA MANY (A) RARE  CBC     Status: Abnormal   Collection Time: 04/05/15 11:41 AM  Result Value Ref Range   WBC 7.6 4.0 - 10.5 K/uL   RBC 3.75 (L) 3.87 - 5.11 MIL/uL   Hemoglobin 11.5 (L) 12.0 - 15.0 g/dL   HCT 16.1 (L) 09.6 - 04.5 %   MCV 90.7 78.0 - 100.0 fL   MCH 30.7 26.0 - 34.0 pg   MCHC 33.8 30.0 - 36.0 g/dL   RDW 40.9 81.1 - 91.4 %   Platelets 208 150 - 400 K/uL  ABO/Rh     Status: None (Preliminary result)   Collection Time: 04/05/15 11:41 AM  Result Value Ref Range    ABO/RH(D) A POS   hCG, quantitative, pregnancy     Status: Abnormal   Collection Time: 04/05/15 11:41 AM  Result Value Ref Range   hCG, Beta Chain, Quant, S 7453 (H) <5 mIU/mL    Comment:          GEST. AGE      CONC.  (mIU/mL)   <=1 WEEK        5 - 50     2 WEEKS       50 - 500     3 WEEKS       100 - 10,000     4 WEEKS     1,000 - 30,000     5 WEEKS     3,500 - 115,000   6-8 WEEKS     12,000 - 270,000    12 WEEKS     15,000 - 220,000        FEMALE AND NON-PREGNANT FEMALE:     LESS THAN 5 mIU/mL     US Ob Comp Less 14 Wks  04/05/2015   CLINICAL DATA:  Lower back and abdominal pain for 1 day. Syncopal episode yesterday with fall.  EXAM: OBSTETRIC <14 WK [place] AND TRANSVAGINAL OB US  TECHNIQUE: Both transabdominal and transvaginal ultrasound examinations were performed for complete evaluation of the gestation as well as the maternal uterus, adnexal regions, and pelvic cul-de-sac. Transvaginal technique was performed to assess early pregnancy.  COMPARISON:  None.  FINDINGS: Intrauterine gestational sac: Visualized/normal in shape.  Yolk sac:  Present.  Embryo:  Not visualized.  MSD: 6.4  mm   5 w   2  d  Maternal uterus/adnexae: Retroflexed uterus. Unremarkable appearance of the ovaries. No free fluid.  IMPRESSION: Early intrauterine gestation, 5 weeks 2 days by ultrasound. Embryo not yet visualized.   Electronically Signed   By: Sebastian Ache   On: 04/05/2015 12:57   US Ob Transvaginal  04/05/2015   CLINICAL DATA:  Lower back and abdominal pain for 1 day. Syncopal episode yesterday with fall.  EXAM: OBSTETRIC <14 WK [place] AND TRANSVAGINAL OB US  TECHNIQUE: Both transabdominal and transvaginal ultrasound examinations were performed for complete evaluation of the gestation as well as the maternal uterus, adnexal regions, and pelvic cul-de-sac. Transvaginal technique was performed to assess early pregnancy.  COMPARISON:  None.  FINDINGS: Intrauterine gestational sac: Visualized/normal in shape.  Yolk sac:   Present.  Embryo:  Not visualized.  MSD: 6.4  mm   5 w   2  d  Maternal uterus/adnexae: Retroflexed uterus. Unremarkable appearance of the ovaries. No free fluid.  IMPRESSION: Early intrauterine gestation, 5 weeks 2 days by ultrasound. Embryo not yet visualized.   Electronically Signed   By: Sebastian Ache   On: 04/05/2015 12:57    Review of Systems  Eyes: Negative for blurred vision.  Respiratory: Negative for shortness of breath.   Cardiovascular: Negative for chest pain and palpitations.  Gastrointestinal: Positive for abdominal pain. Negative for nausea and vomiting.  Genitourinary: Positive for frequency. Negative for dysuria and urgency.       Denies vaginal bleeding. Denies abnormal vaginal discharge.   Musculoskeletal: Positive for back pain.  Neurological: Negative for dizziness.   Physical Exam   Blood pressure 117/69, pulse 94, temperature 98.4 F (36.9 C), resp. rate 18, last menstrual period 02/25/2015.  Physical Exam  Constitutional: She is oriented to person, place, and time. She appears well-developed and well-nourished. No distress.  GI: Normal appearance. There is tenderness in the suprapubic area. There is no rigidity, no rebound, no guarding and no CVA tenderness.  Musculoskeletal: Normal range of motion.  Neurological: She is alert and oriented to person, place, and time.  Skin: Skin is warm. She is not diaphoretic.  Psychiatric: Her behavior is normal.    MAU Course  Procedures  None  MDM Urine culture pending   Assessment and Plan   A:  1. Heat syncope, subsequent encounter   2. Abdominal pain in pregnancy, antepartum   3. UTI (urinary tract infection) during pregnancy, first trimester   4.      SIUP @ 5weeks 2 days with yolk sac.    P:  Discharge home in stable condition RX: Keflex Avoid staying outside for long periods of time.  Drink 6-8 glasses of water per day Return to MAU as needed, if symptoms worsen First trimester warning signs  discussed     Duane Lope, NP 04/05/2015 11:33 AM

## 2015-04-05 NOTE — MAU Note (Signed)
Pt presents to MAU with complaints of lower abdominal, back pain, and states she had an episode yesterday that she passed out. She went to [facility] ED and waited 5 hours and was never evaluated and she left due to wait time. Denies any vaginal bleeding

## 2015-04-05 NOTE — MAU Note (Signed)
Pt states was standing outside yesterday for hours at festival. Was waiting in line for almost an hour, then fainted hitting back and bottom. No head trauma. Denies bleeding or abnormal vaginal discharge.

## 2015-04-06 LAB — HIV ANTIBODY (ROUTINE TESTING W REFLEX): HIV Screen 4th Generation wRfx: NONREACTIVE

## 2015-04-07 LAB — URINE CULTURE: SPECIAL REQUESTS: NORMAL

## 2015-04-09 ENCOUNTER — Ambulatory Visit: Payer: Medicaid Other | Admitting: Nurse Practitioner

## 2015-04-10 ENCOUNTER — Encounter: Payer: Self-pay | Admitting: Nurse Practitioner

## 2015-05-04 NOTE — H&P (Signed)
Courtney [name] is an 24 y.o. female. She presents for scheduled Suction D&E due to missed ab. Pt was noted on initial appt to not have a viable iup. A quant was then obtained and repeat two days later - it dropped from 17000 to 1500 confirming suspicion of missed ab. Options for management were reviewed with pt in full - from conservative noninterventional management to medical management with cytotec to surgical management with D&E. R/b of all options were reviewed and all questions answered. Pt opted for surgical management  Pertinent Gynecological History: Menses: LMP june 2016 Bleeding: none Contraception: none DES exposure: unknown Blood transfusions: none Sexually transmitted diseases: no past history Previous GYN Procedures: none  Last pap: normal Date: 2015 OB History: G2, P1001   Menstrual History: Menarche age: 26  Patient's last menstrual period was 02/25/2015.    Past Medical History  Diagnosis Date  . Headache   . Back pain   . Scoliosis   . Vitamin D deficiency   . Allergic rhinitis   . Depression   . Common migraine with intractable migraine 12/16/2014    Past Surgical History  Procedure Laterality Date  . No past surgeries      Family History  Problem Relation Age of Onset  . Migraines Mother   . Healthy Sister   . Healthy Brother   . Healthy Brother   . Healthy Brother     Social History:  reports that she has never smoked. She has never used smokeless tobacco. She reports that she does not drink alcohol or use illicit drugs.  Allergies: No Known Allergies  No prescriptions prior to admission    Review of Systems  Constitutional: Negative for fever and chills.  Eyes: Negative for blurred vision.  Cardiovascular: Negative for chest pain.  Gastrointestinal: Negative for nausea, vomiting and abdominal pain.  Neurological: Negative for dizziness and headaches.  Psychiatric/Behavioral: Negative for depression. The patient is not nervous/anxious.      Last menstrual period 02/25/2015. Physical Exam  Constitutional: She is oriented to person, place, and time. She appears well-developed and well-nourished.  HENT:  Head: Normocephalic.  Neck: Normal range of motion.  Cardiovascular: Normal rate.   Respiratory: Effort normal.  GI: Soft.  Genitourinary: Vagina normal and uterus normal.  Musculoskeletal: Normal range of motion.  Neurological: She is alert and oriented to person, place, and time.  Skin: Skin is warm.  Psychiatric: She has a normal mood and affect. Her behavior is normal. Judgment and thought content normal.    No results found for this or any previous visit (from the past 24 hour(s)).  No results found.  Assessment/Plan: 76 [id number] with missed ab requesting surgical management via Suction D&E. All r/b reviewed and questions answered. To OR once ready  Edwinna Areola 05/04/2015, 12:36 PM

## 2015-05-05 MED ORDER — CEFOTETAN DISODIUM 2 G IJ SOLR
2.0000 g | INTRAMUSCULAR | Status: AC
  Administered 2015-05-06: 2 g via INTRAVENOUS
  Filled 2015-05-05: qty 2

## 2015-05-05 NOTE — Anesthesia Preprocedure Evaluation (Addendum)
Anesthesia Evaluation  Patient identified by MRN, date of birth, ID band Patient awake    Reviewed: Allergy & Precautions, NPO status , Patient's Chart, lab work & pertinent test results  History of Anesthesia Complications (+) history of anesthetic complications  Airway Mallampati: II   Neck ROM: Full    Dental  (+) Teeth Intact, Dental Advisory Given   Pulmonary neg pulmonary ROS,  breath sounds clear to auscultation        Cardiovascular negative cardio ROS  Rhythm:Regular     Neuro/Psych  Headaches, Depression    GI/Hepatic negative GI ROS, Neg liver ROS,   Endo/Other  negative endocrine ROS  Renal/GU negative Renal ROS     Musculoskeletal Scoliosis, back pain   Abdominal (+)  Abdomen: soft.    Peds  Hematology negative hematology ROS (+)   Anesthesia Other Findings   Reproductive/Obstetrics 1st trimester missed AB                          Anesthesia Physical Anesthesia Plan  ASA: I  Anesthesia Plan: MAC   Post-op Pain Management:    Induction:   Airway Management Planned: Simple Face Mask  Additional Equipment:   Intra-op Plan:   Post-operative Plan:   Informed Consent: I have reviewed the patients History and Physical, chart, labs and discussed the procedure including the risks, benefits and alternatives for the proposed anesthesia with the patient or authorized representative who has indicated his/her understanding and acceptance.     Plan Discussed with:   Anesthesia Plan Comments:         Anesthesia Quick Evaluation

## 2015-05-06 ENCOUNTER — Ambulatory Visit (HOSPITAL_COMMUNITY): Payer: Medicaid Other | Admitting: Anesthesiology

## 2015-05-06 ENCOUNTER — Encounter (HOSPITAL_COMMUNITY)
Admission: RE | Disposition: A | Discharge status: Home / Self Care | Payer: Self-pay | Source: Ambulatory Visit | Attending: Obstetrics and Gynecology

## 2015-05-06 ENCOUNTER — Encounter (HOSPITAL_COMMUNITY): Payer: Self-pay | Admitting: *Deleted

## 2015-05-06 ENCOUNTER — Ambulatory Visit (HOSPITAL_COMMUNITY)
Admission: RE | Admit: 2015-05-06 | Discharge: 2015-05-06 | Disposition: A | Discharge status: Home / Self Care | Payer: Medicaid Other | Source: Ambulatory Visit | Attending: Obstetrics and Gynecology | Admitting: Obstetrics and Gynecology

## 2015-05-06 DIAGNOSIS — O021 Missed abortion: Secondary | ICD-10-CM | POA: Diagnosis not present

## 2015-05-06 DIAGNOSIS — M419 Scoliosis, unspecified: Secondary | ICD-10-CM | POA: Insufficient documentation

## 2015-05-06 DIAGNOSIS — F329 Major depressive disorder, single episode, unspecified: Secondary | ICD-10-CM | POA: Diagnosis not present

## 2015-05-06 DIAGNOSIS — G43019 Migraine without aura, intractable, without status migrainosus: Secondary | ICD-10-CM | POA: Diagnosis not present

## 2015-05-06 DIAGNOSIS — E559 Vitamin D deficiency, unspecified: Secondary | ICD-10-CM | POA: Insufficient documentation

## 2015-05-06 HISTORY — PX: DILATION AND EVACUATION: SHX1459

## 2015-05-06 LAB — CBC
HEMATOCRIT: 35.3 % — AB (ref 36.0–46.0)
HEMOGLOBIN: 12.2 g/dL (ref 12.0–15.0)
MCH: 31.1 pg (ref 26.0–34.0)
MCHC: 34.6 g/dL (ref 30.0–36.0)
MCV: 90.1 fL (ref 78.0–100.0)
Platelets: 234 10*3/uL (ref 150–400)
RBC: 3.92 MIL/uL (ref 3.87–5.11)
RDW: 12.7 % (ref 11.5–15.5)
WBC: 11.6 10*3/uL — AB (ref 4.0–10.5)

## 2015-05-06 LAB — TYPE AND SCREEN
ABO/RH(D): A POS
ANTIBODY SCREEN: NEGATIVE

## 2015-05-06 SURGERY — DILATION AND EVACUATION, UTERUS
Anesthesia: Monitor Anesthesia Care | Site: Vagina

## 2015-05-06 MED ORDER — SCOPOLAMINE 1 MG/3DAYS TD PT72: 1.0000 | MEDICATED_PATCH | Freq: Once | TRANSDERMAL | Status: DC

## 2015-05-06 MED ORDER — SILVER NITRATE-POT NITRATE 75-25 % EX MISC
CUTANEOUS | Status: AC
  Filled 2015-05-06: qty 1

## 2015-05-06 MED ORDER — FENTANYL CITRATE (PF) 100 MCG/2ML IJ SOLN
INTRAMUSCULAR | Status: DC | PRN
Start: 2015-05-06 — End: 2015-05-06
  Administered 2015-05-06: 30 ug via INTRAVENOUS
  Administered 2015-05-06: 50 ug via INTRAVENOUS

## 2015-05-06 MED ORDER — LIDOCAINE HCL 1 % IJ SOLN
INTRAMUSCULAR | Status: DC | PRN
Start: 2015-05-06 — End: 2015-05-06
  Administered 2015-05-06: 20 mL

## 2015-05-06 MED ORDER — FENTANYL CITRATE (PF) 100 MCG/2ML IJ SOLN
INTRAMUSCULAR | Status: AC
  Filled 2015-05-06: qty 4

## 2015-05-06 MED ORDER — MIDAZOLAM HCL 2 MG/2ML IJ SOLN
INTRAMUSCULAR | Status: AC
  Filled 2015-05-06: qty 4

## 2015-05-06 MED ORDER — DEXAMETHASONE SODIUM PHOSPHATE 4 MG/ML IJ SOLN
INTRAMUSCULAR | Status: AC
  Filled 2015-05-06: qty 1

## 2015-05-06 MED ORDER — PROPOFOL 500 MG/50ML IV EMUL
INTRAVENOUS | Status: DC | PRN
  Administered 2015-05-06 (×2): 20 mg via INTRAVENOUS
  Administered 2015-05-06: 30 mg via INTRAVENOUS
  Administered 2015-05-06 (×4): 20 mg via INTRAVENOUS
  Administered 2015-05-06: 10 mg via INTRAVENOUS
  Administered 2015-05-06: 30 mg via INTRAVENOUS
  Administered 2015-05-06: 20 mg via INTRAVENOUS

## 2015-05-06 MED ORDER — LIDOCAINE HCL (CARDIAC) 20 MG/ML IV SOLN
INTRAVENOUS | Status: DC | PRN
  Administered 2015-05-06: 70 mg via INTRAVENOUS
  Administered 2015-05-06: 30 mg via INTRAVENOUS

## 2015-05-06 MED ORDER — MIDAZOLAM HCL 2 MG/2ML IJ SOLN
INTRAMUSCULAR | Status: DC | PRN
  Administered 2015-05-06: 2 mg via INTRAVENOUS

## 2015-05-06 MED ORDER — LIDOCAINE HCL (CARDIAC) 20 MG/ML IV SOLN
INTRAVENOUS | Status: AC
Start: 2015-05-06 — End: 2015-05-06
  Filled 2015-05-06: qty 5

## 2015-05-06 MED ORDER — FENTANYL CITRATE (PF) 100 MCG/2ML IJ SOLN
25.0000 ug | INTRAMUSCULAR | Status: DC | PRN
  Administered 2015-05-06: 50 ug via INTRAVENOUS

## 2015-05-06 MED ORDER — LACTATED RINGERS IV SOLN
INTRAVENOUS | Status: DC
Start: 2015-05-06 — End: 2015-05-06
  Administered 2015-05-06: 07:00:00 via INTRAVENOUS

## 2015-05-06 MED ORDER — DEXAMETHASONE SODIUM PHOSPHATE 10 MG/ML IJ SOLN
INTRAMUSCULAR | Status: DC | PRN
  Administered 2015-05-06: 4 mg via INTRAVENOUS

## 2015-05-06 MED ORDER — MEPERIDINE HCL 25 MG/ML IJ SOLN: 6.2500 mg | INTRAMUSCULAR | Status: DC | PRN

## 2015-05-06 MED ORDER — PROPOFOL 500 MG/50ML IV EMUL
INTRAVENOUS | Status: AC
  Filled 2015-05-06: qty 50

## 2015-05-06 MED ORDER — LACTATED RINGERS IV SOLN
INTRAVENOUS | Status: DC
  Administered 2015-05-06: 06:00:00 via INTRAVENOUS

## 2015-05-06 MED ORDER — LIDOCAINE HCL 1 % IJ SOLN
INTRAMUSCULAR | Status: AC
Start: 2015-05-06 — End: 2015-05-06
  Filled 2015-05-06: qty 20

## 2015-05-06 MED ORDER — ONDANSETRON HCL 4 MG/2ML IJ SOLN
INTRAMUSCULAR | Status: AC
  Filled 2015-05-06: qty 2

## 2015-05-06 MED ORDER — KETOROLAC TROMETHAMINE 30 MG/ML IJ SOLN
INTRAMUSCULAR | Status: DC | PRN
  Administered 2015-05-06: 30 mg via INTRAVENOUS

## 2015-05-06 MED ORDER — FENTANYL CITRATE (PF) 100 MCG/2ML IJ SOLN
INTRAMUSCULAR | Status: AC
  Administered 2015-05-06: 50 ug via INTRAVENOUS
  Filled 2015-05-06: qty 2

## 2015-05-06 MED ORDER — ONDANSETRON HCL 4 MG/2ML IJ SOLN
INTRAMUSCULAR | Status: DC | PRN
  Administered 2015-05-06: 4 mg via INTRAVENOUS

## 2015-05-06 MED ORDER — KETOROLAC TROMETHAMINE 30 MG/ML IJ SOLN
INTRAMUSCULAR | Status: AC
  Filled 2015-05-06: qty 1

## 2015-05-06 MED ORDER — PROMETHAZINE HCL 25 MG/ML IJ SOLN: 6.2500 mg | INTRAMUSCULAR | Status: DC | PRN

## 2015-05-06 SURGICAL SUPPLY — 17 items
CATH ROBINSON RED A/P 16FR (CATHETERS) ×3 IMPLANT
CLOTH BEACON ORANGE TIMEOUT ST (SAFETY) ×3 IMPLANT
CONTAINER PREFILL 10% NBF 60ML (FORM) ×6 IMPLANT
GLOVE BIO SURGEON STRL SZ 6.5 (GLOVE) ×2 IMPLANT
GLOVE BIO SURGEONS STRL SZ 6.5 (GLOVE) ×1
GOWN STRL REUS W/TWL LRG LVL3 (GOWN DISPOSABLE) ×6 IMPLANT
KIT BERKELEY 1ST TRIMESTER 3/8 (MISCELLANEOUS) ×3 IMPLANT
PACK VAGINAL MINOR WOMEN LF (CUSTOM PROCEDURE TRAY) ×3 IMPLANT
PAD OB MATERNITY 4.3X12.25 (PERSONAL CARE ITEMS) ×3 IMPLANT
PAD PREP 24X48 CUFFED NSTRL (MISCELLANEOUS) ×3 IMPLANT
SET BERKELEY SUCTION TUBING (SUCTIONS) ×3 IMPLANT
TOWEL OR 17X24 6PK STRL BLUE (TOWEL DISPOSABLE) ×6 IMPLANT
VACURETTE 10 RIGID CVD (CANNULA) ×2 IMPLANT
VACURETTE 7MM CVD STRL WRAP (CANNULA) IMPLANT
VACURETTE 8 RIGID CVD (CANNULA) IMPLANT
VACURETTE 9 RIGID CVD (CANNULA) IMPLANT
WATER STERILE IRR 1000ML POUR (IV SOLUTION) ×3 IMPLANT

## 2015-05-06 NOTE — Op Note (Signed)
Patient was taken to the operating room where LMA anesthesia was obtained without difficulty. She was then prepped and draped in the normal sterile fashion in the dorsal lithotomy position. An appropriate time out was performed. A speculum was then placed within the vagina and the anterior lip of the cervix identified and grasped with a single toothed tenaculum. Os was noted to be 1cm dilated with scant blood present. A paracervical block was performed with 1% lidocaine.  Uterus was then sounded to 10cm.  The Pratt dilators were utilized to dilate the cervix up to approximately 22. A size 10 rigid cannula was then inserted into the uterine cavity and suction begun. There was moderate amount of products evacuated. After 5 passes, a sharp curettage was then performed with a gritty texture noted in all 4 quadrants. Three more passes with the suction cannula were done. Minimal amount of products were returned this time.   Hence all instruments were removed from the vagina.  The tenaculum site was noted to be hemostatic.  The vault was cleaned. The speculum was removed from the vagina and the patient awakened and taken to the recovery room in good condition.  Counts correct per nursing staff

## 2015-05-06 NOTE — Transfer of Care (Signed)
Immediate Anesthesia Transfer of Care Note  Patient: Courtney [name]  Procedure(s) Performed: Procedure(s): DILATATION AND EVACUATION (N/A)  Patient Location: PACU  Anesthesia Type:MAC  Level of Consciousness: awake, alert , oriented and patient cooperative  Airway & Oxygen Therapy: Patient Spontanous Breathing  Post-op Assessment: Report given to RN and Post -op Vital signs reviewed and stable  Post vital signs: Reviewed and stable  Last Vitals:  Filed Vitals:   05/06/15 0555  BP: 121/73  Pulse: 101  Temp: 36.9 C  Resp: 20    Complications: No apparent anesthesia complications

## 2015-05-06 NOTE — H&P (Signed)
  Pt s/e this am. Reports mild spotting only since Monday night but no bleeding or cramping. All history unchanged. Ready to proceed with surgery. All questions answered To OR when ready for Suction D&E

## 2015-05-06 NOTE — Anesthesia Postprocedure Evaluation (Signed)
  Anesthesia Post-op Note  Patient: Courtney [name]  Procedure(s) Performed: Procedure(s): DILATATION AND EVACUATION (N/A)  Patient Location: PACU  Anesthesia Type:MAC  Level of Consciousness: awake  Airway and Oxygen Therapy: Patient Spontanous Breathing  Post-op Pain: mild  Post-op Assessment: Post-op Vital signs reviewed              Post-op Vital Signs: Reviewed and stable  Last Vitals:  Filed Vitals:   05/06/15 0800  BP: 114/72  Pulse: 85  Temp:   Resp: 9    Complications: No apparent anesthesia complications

## 2015-05-06 NOTE — Discharge Instructions (Signed)
Nothing in vagina for 2 weeks.  No sex, tampons, and douching.  Call if heavy bleeding or uncontrolled pain, fever or chills  DISCHARGE INSTRUCTIONS: D&C / D&E The following instructions have been prepared to help you care for yourself upon your return home.   Personal hygiene:  Use sanitary pads for vaginal drainage, not tampons.  Shower the day after your procedure.  NO tub baths, pools or Jacuzzis for 2-3 weeks.  Wipe front to back after using the bathroom.  Activity and limitations:  Do NOT drive or operate any equipment for 24 hours. The effects of anesthesia are still present and drowsiness may result.  Do NOT rest in bed all day.  Walking is encouraged.  Walk up and down stairs slowly.  You may resume your normal activity in one to two days or as indicated by your physician.  Sexual activity: NO intercourse for at least 2 weeks after the procedure, or as indicated by your physician.  Diet: Eat a light meal as desired this evening. You may resume your usual diet tomorrow.  Return to work: You may resume your work activities in one to two days or as indicated by your doctor.  What to expect after your surgery: Expect to have vaginal bleeding/discharge for 2-3 days and spotting for up to 10 days. It is not unusual to have soreness for up to 1-2 weeks. You may have a slight burning sensation when you urinate for the first day. Mild cramps may continue for a couple of days. You may have a regular period in 2-6 weeks.  Call your doctor for any of the following:  Excessive vaginal bleeding, saturating and changing one pad every hour.  Inability to urinate 6 hours after discharge from hospital.  Pain not relieved by pain medication.  Fever of 100.4 F or greater.  Unusual vaginal discharge or odor.   Call for an appointment:    Patients signature: ______________________  Nurses signature ________________________  Support person's  signature_______________________   DISCHARGE INSTRUCTIONS: D&C / D&E The following instructions have been prepared to help you care for yourself upon your return home.   Personal hygiene:  Use sanitary pads for vaginal drainage, not tampons.  Shower the day after your procedure.  NO tub baths, pools or Jacuzzis for 2-3 weeks.  Wipe front to back after using the bathroom.  Activity and limitations:  Do NOT drive or operate any equipment for 24 hours. The effects of anesthesia are still present and drowsiness may result.  Do NOT rest in bed all day.  Walking is encouraged.  Walk up and down stairs slowly.  You may resume your normal activity in one to two days or as indicated by your physician.  Sexual activity: NO intercourse for at least 2 weeks after the procedure, or as indicated by your physician.  Diet: Eat a light meal as desired this evening. You may resume your usual diet tomorrow.  Return to work: You may resume your work activities in one to two days or as indicated by your doctor.  What to expect after your surgery: Expect to have vaginal bleeding/discharge for 2-3 days and spotting for up to 10 days. It is not unusual to have soreness for up to 1-2 weeks. You may have a slight burning sensation when you urinate for the first day. Mild cramps may continue for a couple of days. You may have a regular period in 2-6 weeks.  Call your doctor for any of the following:  Excessive  vaginal bleeding, saturating and changing one pad every hour.  Inability to urinate 6 hours after discharge from hospital.  Pain not relieved by pain medication.  Fever of 100.4 F or greater.  Unusual vaginal discharge or odor.   Call for an appointment:    Patients signature: ______________________  Nurses signature ________________________  Support person's signature_______________________

## 2015-05-07 ENCOUNTER — Encounter (HOSPITAL_COMMUNITY): Payer: Self-pay | Admitting: Obstetrics and Gynecology

## 2015-06-16 ENCOUNTER — Ambulatory Visit (INDEPENDENT_AMBULATORY_CARE_PROVIDER_SITE_OTHER): Payer: Medicaid Other | Admitting: Nurse Practitioner

## 2015-06-16 ENCOUNTER — Encounter: Payer: Self-pay | Admitting: Nurse Practitioner

## 2015-06-16 VITALS — HR 73 | Ht 62.0 in | Wt 170.2 lb

## 2015-06-16 DIAGNOSIS — G43019 Migraine without aura, intractable, without status migrainosus: Secondary | ICD-10-CM | POA: Diagnosis not present

## 2015-06-16 DIAGNOSIS — Z5181 Encounter for therapeutic drug level monitoring: Secondary | ICD-10-CM | POA: Diagnosis not present

## 2015-06-16 NOTE — Patient Instructions (Signed)
Will schedule MRI of the brain Magnesium  twice daily with food Riboflavin 100 mg 2 twice a day with food Given list of foods that her migraine triggers, eliminate one food at a time Follow-up in 3-4 months

## 2015-06-16 NOTE — Progress Notes (Signed)
[facility]  PATIENT: Courtney [name] DOB: [birth year]   REASON FOR VISIT: Follow-up for common migraine HISTORY FROM: Patient    HISTORY OF PRESENT ILLNESS: Courtney [name], 24 year old female returns for follow-up she was initially evaluated 4/5/ 2016 by Dr. Anne Hahn for migraine. She was placed on Topamax at that time but only took the medication for month and says it did not do any good. She became pregnant and had a  miscarriage on 05/02/2015. Her headaches continue to be in left frontal area that can last up to 2 days at a time. She can have nausea and vomiting with the headache and she will have photophobia and phonophobia. She denies any neck stiffness with headache She says she will lay down and sleep it off. She denies any numbness or weakness of the face arms or legs. She was taking a lot of BC powders and  she has stopped that She takes a lot of Excedrin migraine and ibuprofen. She is not aware of any specific food triggers. She claims she is having a daily headache for a month now. She returns for reevaluation  HISTORY Courtney [name] is a 24 year old patient with frequent migraine headaches over the last 4 years. The patient indicates that the headaches are always in the left frontal area, and may last up to 2 days at a time. The patient indicates that she will have nausea and vomiting with the headache, and she will have photophobia and phonophobia. She will have spots in front eyes, no visual loss. She feels dizziness at times, she denies any numbness or weakness on the face, arms, or legs. She has been on a multitude of medications in the past, including Maxalt and hydrocodone. The patient indicates that she did not gain much benefit with these medications. She currently takes [facility] powders, which she takes infrequently, up to 12 a week. The patient will get some benefit from [facility] powders. Occasionally, she may need to leave work because the headache, this may happen up  to the 2 times a month. She has at least 15 days with headache each month. The patient also drinks 2 caffeinated soft drinks daily. She was given a prescription for Topamax in the past, but she never took the medication. She comes to the office today for an evaluation. She has never had a scan of the brain. She denies any neck stiffness with the headache.   REVIEW OF SYSTEMS: Full 14 system review of systems performed and notable only for those listed, all others are neg:  Constitutional: neg  Cardiovascular: neg Ear/Nose/Throat: neg  Skin: neg Eyes: neg Respiratory: neg Gastroitestinal: Nausea vomiting  Hematology/Lymphatic: neg  Endocrine: neg Musculoskeletal:neg Allergy/Immunology: neg Neurological: Headache Psychiatric: neg Sleep : neg   ALLERGIES: No Known Allergies  HOME MEDICATIONS: Outpatient Prescriptions Prior to Visit  Medication Sig Dispense Refill  . acetaminophen (TYLENOL) 325 MG tablet Take 650 mg by mouth every 6 (six) hours as needed.    Marland Kitchen aspirin-acetaminophen-caffeine (EXCEDRIN MIGRAINE) 250-250-65 MG per tablet Take 1 tablet by mouth every 6 (six) hours as needed for headache.    . cephALEXin (KEFLEX) 500 MG capsule Take 1 capsule (500 mg total) by mouth 4 (four) times daily. 20 capsule 0  . ibuprofen (ADVIL,MOTRIN) 400 MG tablet Take 400 mg by mouth every 6 (six) hours as needed.     No facility-administered medications prior to visit.    PAST MEDICAL HISTORY: Past Medical History  Diagnosis Date  . Headache   .  Back pain   . Scoliosis   . Vitamin D deficiency   . Allergic rhinitis   . Depression   . Common migraine with intractable migraine 12/16/2014    PAST SURGICAL HISTORY: Past Surgical History  Procedure Laterality Date  . No past surgeries    . Dilation and evacuation N/A 05/06/2015    Procedure: DILATATION AND EVACUATION;  Surgeon: Edwinna Areola, DO;  Location: [facility];  Service: Gynecology;  Laterality: N/A;    FAMILY  HISTORY: Family History  Problem Relation Age of Onset  . Migraines Mother   . Healthy Sister   . Healthy Brother   . Healthy Brother   . Healthy Brother     SOCIAL HISTORY: Social History   Social History  . Marital Status: Single    Spouse Name: N/A  . Number of Children: 1  . Years of Education: 12   Occupational History  . [employer]    Social History Main Topics  . Smoking status: Never Smoker   . Smokeless tobacco: Never Used  . Alcohol Use: No  . Drug Use: No  . Sexual Activity: Yes    Birth Control/ Protection: None   Other Topics Concern  . Not on file   Social History Narrative   Patient is right handed.   Patient drinks 2 cups of caffeine daily.     PHYSICAL EXAM  Filed Vitals:   06/16/15 1602  BP: 110/59  Pulse: 73  Height:  (1.575 m)  Weight: 170 lb 3.2 oz (77.202 kg)   Body mass index is 31.12 kg/(m^2). General: The patient is alert and cooperative at the time of the examination. The patient has a flat affect. Neck: The neck is supple, no carotid bruits are noted. Cardiovascular: The cardiovascular examination reveals a regular rate and rhythm, no obvious murmurs or rubs are noted. Neuromuscular: Range of movement of the cervical spine was full. No crepitus is noted in the temporomandibular joints. Skin: Extremities are without significant edema.  Neurologic Exam  Mental status: The patient is alert and oriented x 3 at the time of the examination. The patient has apparent normal recent and remote memory, with an apparently normal attention span and concentration ability.  Cranial nerves: Facial symmetry is present. There is good sensation of the face to pinprick and soft touch bilaterally. The strength of the facial muscles and the muscles to head turning and shoulder shrug are normal bilaterally. Speech is well enunciated, no aphasia or dysarthria is noted. Pupils are equal, round, and reactive to light. Discs are flat  bilaterally.Extraocular movements are full. Visual fields are full. The tongue is midline, and the patient has symmetric elevation of the soft palate. No obvious hearing deficits are noted. Motor: The motor testing reveals 5 over 5 strength of all 4 extremities. Good symmetric motor tone is noted throughout. Sensory: Sensory testing is intact to pinprick, soft touch, vibration sensation, and position sense on all 4 extremities. No evidence of extinction is noted. Coordination: Cerebellar testing reveals good finger-nose-finger and heel-to-shin bilaterally. Gait and station: Gait is normal. Tandem gait is normal. Romberg is negative. No drift is seen. Reflexes: Deep tendon reflexes are symmetric and normal bilaterally. Toes are downgoing DIAGNOSTIC DATA (LABS, IMAGING, TESTING) - I reviewed patient records, labs, notes, testing and imaging myself where available.  Lab Results  Component Value Date   WBC 11.6* 05/06/2015   HGB 12.2 05/06/2015   HCT 35.3* 05/06/2015   MCV 90.1 05/06/2015  PLT 234 05/06/2015      Component Value Date/Time   NA 134* 04/04/2015 2137   K 3.7 04/04/2015 2137   CL 105 04/04/2015 2137   CO2 21* 04/04/2015 2137   GLUCOSE 134* 04/04/2015 2137   BUN 10 04/04/2015 2137   CREATININE 0.55 04/04/2015 2137   CALCIUM 9.1 04/04/2015 2137   PROT 7.1 04/04/2015 2137   ALBUMIN 4.0 04/04/2015 2137   AST 15 04/04/2015 2137   ALT 14 04/04/2015 2137   ALKPHOS 51 04/04/2015 2137   BILITOT 0.4 04/04/2015 2137   GFRNONAA >60 04/04/2015 2137   GFRAA >60 04/04/2015 2137    ASSESSMENT AND PLAN  24 y.o. year old female  has a past medical history of Common migraine with intractable migraine (12/16/2014). here to follow up. She continues to use a lot of Excedrin migraine, Tylenol and ibuprofen. She was placed on Topamax but only took the medication for a month. She was given a prescription for Imitrex but has not taken it. She recently had a miscarriage but says she is not trying  to get pregnant however she is not using any birth control .The patient is a current patient of Dr. Anne Hahn  who is out of the office today . This note is sent to the work in doctor.     Will schedule MRI of the brain with and without contrast Magnesium  twice daily with food Riboflavin 100 mg 2 twice a day with food Given list of foods that are migraine triggers, eliminate one food at a time reviewed these with her I spent additional 15 minutes in total face to face time with the patient more than 50% of which was spent counseling and coordination of care, reviewing test results reviewing medications and discussing and reviewing the diagnosis of migraine and further treatment options. Importance of keeping a diary if headaches worsen to include the time of the headache what you're doing any other specific information that would be useful. Discussed stress relief techniques such as deep breathing muscle relaxation mental relaxation to music. Discussed importance of exercise, regular meals  and sleep. Sleep deprivation can be a migraine trigger. Discussed rebound headache which is caused by over the counter preparations such as Excedrin Migraine and Tylenol and ibuprofen Follow-up in 3-4 monthsVst time 26 min Nilda Riggs, [facility], [facility], APRN  [facility] [address] [place], [place] [address] [phone]

## 2015-06-17 ENCOUNTER — Telehealth: Payer: Self-pay | Admitting: *Deleted

## 2015-06-17 LAB — BASIC METABOLIC PANEL
BUN/Creatinine Ratio: 22 — ABNORMAL HIGH (ref 8–20)
BUN: 16 mg/dL (ref 6–20)
CALCIUM: 9.5 mg/dL (ref 8.7–10.2)
CHLORIDE: 103 mmol/L (ref 97–108)
CO2: 23 mmol/L (ref 18–29)
Creatinine, Ser: 0.73 mg/dL (ref 0.57–1.00)
GFR calc Af Amer: 131 mL/min/{1.73_m2} (ref >59)
GFR calc non Af Amer: 114 mL/min/{1.73_m2} (ref >59)
GLUCOSE: 91 mg/dL (ref 65–99)
POTASSIUM: 4.5 mmol/L (ref 3.5–5.2)
SODIUM: 140 mmol/L (ref 134–144)

## 2015-06-17 NOTE — Telephone Encounter (Signed)
Called and spoke to pt about normal labs. Pt verbalized understanding and has no further questions at this time.

## 2015-06-17 NOTE — Progress Notes (Signed)
I have reviewed and agreed above plan. 

## 2015-06-17 NOTE — Telephone Encounter (Signed)
-----   Message from Nancy Carolyn Martin, NP sent at 06/17/2015  8:16 AM EDT ----- Please call normal labs 

## 2015-06-18 ENCOUNTER — Other Ambulatory Visit: Payer: Self-pay | Admitting: Nurse Practitioner

## 2015-06-30 ENCOUNTER — Ambulatory Visit
Admission: RE | Admit: 2015-06-30 | Discharge: 2015-06-30 | Disposition: A | Discharge status: Home / Self Care | Payer: Medicaid Other | Source: Ambulatory Visit | Attending: Nurse Practitioner | Admitting: Nurse Practitioner

## 2015-06-30 ENCOUNTER — Encounter (INDEPENDENT_AMBULATORY_CARE_PROVIDER_SITE_OTHER): Payer: Medicaid Other | Admitting: Neurology

## 2015-06-30 DIAGNOSIS — G43019 Migraine without aura, intractable, without status migrainosus: Secondary | ICD-10-CM

## 2015-06-30 MED ORDER — GADOBENATE DIMEGLUMINE 529 MG/ML IV SOLN: 15.0000 mL | Freq: Once | INTRAVENOUS | Status: DC | PRN

## 2015-07-02 ENCOUNTER — Telehealth: Payer: Self-pay | Admitting: *Deleted

## 2015-07-02 NOTE — Telephone Encounter (Signed)
I called the patient. I went over the results of the MRI the brain. The patient has a large draining vein that likely has nothing to do with the headaches. I discussed this with her.

## 2015-07-02 NOTE — Telephone Encounter (Signed)
I spoke to pt and gave the results of no acute findings.  She asked for more specifics.  I told her I would have provider call her. She states that her headaches are on her L side.  ? Large vein the cause.

## 2015-07-02 NOTE — Telephone Encounter (Signed)
-----   Message from Nilda RiggsNancy Carolyn Martin, NP sent at 07/02/2015  1:13 PM EDT ----- MRI of the brain without acute findings. Please call the patient

## 2015-09-01 ENCOUNTER — Emergency Department (HOSPITAL_COMMUNITY)
Admission: EM | Admit: 2015-09-01 | Discharge: 2015-09-01 | Disposition: A | Discharge status: Home / Self Care | Payer: Medicaid Other | Source: Home / Self Care

## 2015-09-01 ENCOUNTER — Encounter (HOSPITAL_COMMUNITY): Payer: Self-pay | Admitting: Emergency Medicine

## 2015-09-01 DIAGNOSIS — K529 Noninfective gastroenteritis and colitis, unspecified: Secondary | ICD-10-CM

## 2015-09-01 MED ORDER — ONDANSETRON 4 MG PO TBDP
4.0000 mg | ORAL_TABLET | Freq: Once | ORAL | Status: AC
  Administered 2015-09-01: 4 mg via ORAL

## 2015-09-01 MED ORDER — ONDANSETRON HCL 4 MG PO TABS: 4.0000 mg | ORAL_TABLET | Freq: Three times a day (TID) | ORAL | Status: DC | PRN

## 2015-09-01 MED ORDER — ONDANSETRON 4 MG PO TBDP
ORAL_TABLET | ORAL | Status: AC
  Filled 2015-09-01: qty 1

## 2015-09-01 NOTE — ED Notes (Signed)
C/o abd pain onset last night associated w/vomiting and diarrhea Denies fevers A&O x4... No acute distress.

## 2015-09-01 NOTE — Discharge Instructions (Signed)
Diarrhea Diarrhea is frequent loose and watery bowel movements. It can cause you to feel weak and dehydrated. Dehydration can cause you to become tired and thirsty, have a dry mouth, and have decreased urination that often is dark yellow. Diarrhea is a sign of another problem, most often an infection that will not last long. In most cases, diarrhea typically lasts 2-3 days. However, it can last longer if it is a sign of something more serious. It is important to treat your diarrhea as directed by your caregiver to lessen or prevent future episodes of diarrhea. CAUSES  Some common causes include:  Gastrointestinal infections caused by viruses, bacteria, or parasites.  Food poisoning or food allergies.  Certain medicines, such as antibiotics, chemotherapy, and laxatives.  Artificial sweeteners and fructose.  Digestive disorders. HOME CARE INSTRUCTIONS  Ensure adequate fluid intake (hydration): Have 1 cup (8 oz) of fluid for each diarrhea episode. Avoid fluids that contain simple sugars or sports drinks, fruit juices, whole milk products, and sodas. Your urine should be clear or pale yellow if you are drinking enough fluids. Hydrate with an oral rehydration solution that you can purchase at pharmacies, retail stores, and online. You can prepare an oral rehydration solution at home by mixing the following ingredients together:   - tsp table salt.   tsp baking soda.   tsp salt substitute containing potassium chloride.  1  tablespoons sugar.  1 L (34 oz) of water.  Certain foods and beverages may increase the speed at which food moves through the gastrointestinal (GI) tract. These foods and beverages should be avoided and include:  Caffeinated and alcoholic beverages.  High-fiber foods, such as raw fruits and vegetables, nuts, seeds, and whole grain breads and cereals.  Foods and beverages sweetened with sugar alcohols, such as xylitol, sorbitol, and mannitol.  Some foods may be well  tolerated and may help thicken stool including:  Starchy foods, such as rice, toast, pasta, low-sugar cereal, oatmeal, grits, baked potatoes, crackers, and bagels.  Bananas.  Applesauce.  Add probiotic-rich foods to help increase healthy bacteria in the GI tract, such as yogurt and fermented milk products.  Wash your hands well after each diarrhea episode.  Only take over-the-counter or prescription medicines as directed by your caregiver.  Take a warm bath to relieve any burning or pain from frequent diarrhea episodes. SEEK IMMEDIATE MEDICAL CARE IF:   You are unable to keep fluids down.  You have persistent vomiting.  You have blood in your stool, or your stools are black and tarry.  You do not urinate in 6-8 hours, or there is only a small amount of very dark urine.  You have abdominal pain that increases or localizes.  You have weakness, dizziness, confusion, or light-headedness.  You have a severe headache.  Your diarrhea gets worse or does not get better.  You have a fever or persistent symptoms for more than 2-3 days.  You have a fever and your symptoms suddenly get worse. MAKE SURE YOU:   Understand these instructions.  Will watch your condition.  Will get help right away if you are not doing well or get worse.   This information is not intended to replace advice given to you by your health care provider. Make sure you discuss any questions you have with your health care provider.   Document Released: 08/19/2002 Document Revised: 09/19/2014 Document Reviewed: 05/06/2012 Elsevier Interactive Patient Education 2016 Elsevier Inc. Gastritis, Adult Gastritis is soreness and swelling (inflammation) of the lining of  the stomach. Gastritis can develop as a sudden onset (acute) or long-term (chronic) condition. If gastritis is not treated, it can lead to stomach bleeding and ulcers. CAUSES  Gastritis occurs when the stomach lining is weak or damaged. Digestive  juices from the stomach then inflame the weakened stomach lining. The stomach lining may be weak or damaged due to viral or bacterial infections. One common bacterial infection is the Helicobacter pylori infection. Gastritis can also result from excessive alcohol consumption, taking certain medicines, or having too much acid in the stomach.  SYMPTOMS  In some cases, there are no symptoms. When symptoms are present, they may include:  Pain or a burning sensation in the upper abdomen.  Nausea.  Vomiting.  An uncomfortable feeling of fullness after eating. DIAGNOSIS  Your caregiver may suspect you have gastritis based on your symptoms and a physical exam. To determine the cause of your gastritis, your caregiver may perform the following:  Blood or stool tests to check for the H pylori bacterium.  Gastroscopy. A thin, flexible tube (endoscope) is passed down the esophagus and into the stomach. The endoscope has a light and camera on the end. Your caregiver uses the endoscope to view the inside of the stomach.  Taking a tissue sample (biopsy) from the stomach to examine under a microscope. TREATMENT  Depending on the cause of your gastritis, medicines may be prescribed. If you have a bacterial infection, such as an H pylori infection, antibiotics may be given. If your gastritis is caused by too much acid in the stomach, H2 blockers or antacids may be given. Your caregiver may recommend that you stop taking aspirin, ibuprofen, or other nonsteroidal anti-inflammatory drugs (NSAIDs). HOME CARE INSTRUCTIONS  Only take over-the-counter or prescription medicines as directed by your caregiver.  If you were given antibiotic medicines, take them as directed. Finish them even if you start to feel better.  Drink enough fluids to keep your urine clear or pale yellow.  Avoid foods and drinks that make your symptoms worse, such as:  Caffeine or alcoholic drinks.  Chocolate.  Peppermint or mint  flavorings.  Garlic and onions.  Spicy foods.  Citrus fruits, such as oranges, lemons, or limes.  Tomato-based foods such as sauce, chili, salsa, and pizza.  Fried and fatty foods.  Eat small, frequent meals instead of large meals. SEEK IMMEDIATE MEDICAL CARE IF:   You have black or dark red stools.  You vomit blood or material that looks like coffee grounds.  You are unable to keep fluids down.  Your abdominal pain gets worse.  You have a fever.  You do not feel better after 1 week.  You have any other questions or concerns. MAKE SURE YOU:  Understand these instructions.  Will watch your condition.  Will get help right away if you are not doing well or get worse.   This information is not intended to replace advice given to you by your health care provider. Make sure you discuss any questions you have with your health care provider.   Document Released: 08/23/2001 Document Revised: 02/28/2012 Document Reviewed: 10/12/2011 Elsevier Interactive Patient Education [employer]2016 Elsevier Inc.

## 2015-09-01 NOTE — ED Notes (Signed)
Pt able to keep down sprite. 

## 2015-09-01 NOTE — ED Provider Notes (Signed)
CSN: [id number]     Arrival date & time 09/01/15  1307 History   None    Chief Complaint  Patient presents with  . Abdominal Pain   (Consider location/radiation/quality/duration/timing/severity/associated sxs/prior Treatment) HPI History obtained from patient: Patient is concerned that she may have picked up food poisoning after snacking at work yesterday he denies eating any chicken, seafood. She does state that she use some dips which had been sitting out for quite some time. LOCATION:GI SEVERITY: 4 (cramps) DURATION:since last night CONTEXT:snacking at work QUALITY: MODIFYING FACTORS:none ASSOCIATED SYMPTOMS:vomiting and diarrhea TIMING:constant OCCUPATION: ware house  Past Medical History  Diagnosis Date  . Headache   . Back pain   . Scoliosis   . Vitamin D deficiency   . Allergic rhinitis   . Depression   . Common migraine with intractable migraine 12/16/2014   Past Surgical History  Procedure Laterality Date  . No past surgeries    . Dilation and evacuation N/A 05/06/2015    Procedure: DILATATION AND EVACUATION;  Surgeon: Edwinna Areolaecilia Worema Banga, DO;  Location: [facility];  Service: Gynecology;  Laterality: N/A;   Family History  Problem Relation Age of Onset  . Migraines Mother   . Healthy Sister   . Healthy Brother   . Healthy Brother   . Healthy Brother    Social History  Substance Use Topics  . Smoking status: Never Smoker   . Smokeless tobacco: Never Used  . Alcohol Use: No   OB History    Gravida Para Term Preterm AB TAB SAB Ectopic Multiple Living   2 1 1       1      Review of Systems ROS +'ve vomiting and diarrhea  Denies: HEADACHE, NAUSEA, ABDOMINAL PAIN, CHEST PAIN, CONGESTION, DYSURIA, SHORTNESS OF BREATH  Allergies  Review of patient's allergies indicates no known allergies.  Home Medications   Prior to Admission medications   Medication Sig Start Date End Date Taking? Authorizing Provider  acetaminophen (TYLENOL) 325 MG tablet Take 650 mg by  mouth every 6 (six) hours as needed.    Historical Provider, MD  ibuprofen (ADVIL,MOTRIN) 400 MG tablet Take 800 mg by mouth every 6 (six) hours as needed.     Historical Provider, MD   Meds Ordered and Administered this Visit   Medications  ondansetron (ZOFRAN-ODT) disintegrating tablet 4 mg (not administered)    BP 121/73 mmHg  Pulse 108  Temp(Src) 98.4 F (36.9 C) (Oral)  Resp 18  SpO2 100%  LMP 08/06/2015  Breastfeeding? Unknown No data found.   Physical Exam  Constitutional: She is oriented to person, place, and time. She appears well-developed and well-nourished.  HENT:  Head: Normocephalic and atraumatic.  Mouth/Throat: Oropharynx is clear and moist. No oropharyngeal exudate.  Eyes: Conjunctivae are normal.  Neck: Normal range of motion.  Abdominal: There is no tenderness. There is no rebound and no guarding.  Musculoskeletal: Normal range of motion.  Neurological: She is alert and oriented to person, place, and time.  Skin: Skin is warm and dry.  Psychiatric: She has a normal mood and affect. Her behavior is normal. Judgment and thought content normal.  Nursing note and vitals reviewed.   ED Course  Procedures (including critical care time)  Labs Review Labs Reviewed - No data to display  Imaging Review No results found.   Visual Acuity Review  Right Eye Distance:   Left Eye Distance:   Bilateral Distance:    Right Eye Near:   Left Eye Near:  Bilateral Near:         MDM   1. Gastroenteritis    Treatment in urgent care included Zofran orally. After administration patient was given clear fluids to drink which she did tolerate the patient states that she does feel better.  Patient is advised to continue home symptomatic treatment. Prescription Zofran for is provided to the patient. Patient is advised that if there are new or worsening symptoms or attend the emergency department, or contact primary care provider. Instructions of care provided  discharged home in stable condition. Return to work note was also provided for the patient.  THIS NOTE WAS GENERATED USING A VOICE RECOGNITION SOFTWARE PROGRAM. ALL REASONABLE EFFORTS  WERE MADE TO PROOFREAD THIS DOCUMENT FOR ACCURACY.     Tharon Aquas, PA 09/01/15 [phone]

## 2015-09-01 NOTE — ED Notes (Signed)
Pt has been given Sprite

## 2015-10-06 ENCOUNTER — Ambulatory Visit: Payer: Medicaid Other | Admitting: Nurse Practitioner

## 2015-10-07 ENCOUNTER — Encounter: Payer: Self-pay | Admitting: Nurse Practitioner

## 2017-04-09 ENCOUNTER — Encounter (HOSPITAL_COMMUNITY): Payer: Self-pay | Admitting: *Deleted

## 2017-04-09 ENCOUNTER — Ambulatory Visit (HOSPITAL_COMMUNITY)
Admission: EM | Admit: 2017-04-09 | Discharge: 2017-04-09 | Disposition: A | Discharge status: Home / Self Care | Payer: Medicaid Other | Attending: Internal Medicine | Admitting: Internal Medicine

## 2017-04-09 DIAGNOSIS — A084 Viral intestinal infection, unspecified: Secondary | ICD-10-CM

## 2017-04-09 HISTORY — DX: Gastro-esophageal reflux disease without esophagitis: K21.9

## 2017-04-09 LAB — POCT PREGNANCY, URINE: PREG TEST UR: NEGATIVE

## 2017-04-09 NOTE — ED Triage Notes (Signed)
C/O nausea, fatigue x 1 wk with occasional stomach pain.

## 2017-04-10 LAB — GLUCOSE, CAPILLARY: GLUCOSE-CAPILLARY: 87 mg/dL (ref 65–99)

## 2017-05-19 ENCOUNTER — Inpatient Hospital Stay (HOSPITAL_COMMUNITY): Payer: Medicaid Other

## 2017-05-19 ENCOUNTER — Inpatient Hospital Stay (HOSPITAL_COMMUNITY)
Admission: AD | Admit: 2017-05-19 | Discharge: 2017-05-19 | Disposition: A | Discharge status: Home / Self Care | Payer: Medicaid Other | Source: Ambulatory Visit | Attending: Obstetrics and Gynecology | Admitting: Obstetrics and Gynecology

## 2017-05-19 ENCOUNTER — Encounter (HOSPITAL_COMMUNITY): Payer: Self-pay | Admitting: *Deleted

## 2017-05-19 DIAGNOSIS — O26891 Other specified pregnancy related conditions, first trimester: Secondary | ICD-10-CM | POA: Diagnosis not present

## 2017-05-19 DIAGNOSIS — Z3A01 Less than 8 weeks gestation of pregnancy: Secondary | ICD-10-CM | POA: Insufficient documentation

## 2017-05-19 DIAGNOSIS — O3680X0 Pregnancy with inconclusive fetal viability, not applicable or unspecified: Secondary | ICD-10-CM

## 2017-05-19 DIAGNOSIS — R109 Unspecified abdominal pain: Secondary | ICD-10-CM | POA: Diagnosis not present

## 2017-05-19 DIAGNOSIS — O26899 Other specified pregnancy related conditions, unspecified trimester: Secondary | ICD-10-CM

## 2017-05-19 DIAGNOSIS — R1030 Lower abdominal pain, unspecified: Secondary | ICD-10-CM | POA: Diagnosis not present

## 2017-05-19 HISTORY — DX: Dermatitis, unspecified: L30.9

## 2017-05-19 HISTORY — DX: Unspecified infectious disease: B99.9

## 2017-05-19 LAB — CBC
HEMATOCRIT: 35.8 % — AB (ref 36.0–46.0)
HEMOGLOBIN: 12.4 g/dL (ref 12.0–15.0)
MCH: 31.2 pg (ref 26.0–34.0)
MCHC: 34.6 g/dL (ref 30.0–36.0)
MCV: 90.2 fL (ref 78.0–100.0)
PLATELETS: 258 10*3/uL (ref 150–400)
RBC: 3.97 MIL/uL (ref 3.87–5.11)
RDW: 13 % (ref 11.5–15.5)
WBC: 10.7 10*3/uL — ABNORMAL HIGH (ref 4.0–10.5)

## 2017-05-19 LAB — URINALYSIS, ROUTINE W REFLEX MICROSCOPIC
Bacteria, UA: NONE SEEN
Bilirubin Urine: NEGATIVE
GLUCOSE, UA: NEGATIVE mg/dL
KETONES UR: NEGATIVE mg/dL
Nitrite: NEGATIVE
PH: 5 (ref 5.0–8.0)
Protein, ur: NEGATIVE mg/dL
SPECIFIC GRAVITY, URINE: 1.02 (ref 1.005–1.030)

## 2017-05-19 LAB — HCG, QUANTITATIVE, PREGNANCY: hCG, Beta Chain, Quant, S: 275 m[IU]/mL — ABNORMAL HIGH (ref <5)

## 2017-05-19 LAB — POCT PREGNANCY, URINE: Preg Test, Ur: POSITIVE — AB

## 2017-05-19 LAB — ABO/RH: ABO/RH(D): A POS

## 2017-05-19 NOTE — MAU Note (Addendum)
Been having like real bad cramping past couple days. Realized she was late.  +HPT. Has been having diarrhea off and on for 2 wks, loose and watery. (1 stool today, loose)

## 2017-05-19 NOTE — Discharge Instructions (Signed)
Return to care   If you have heavier bleeding that soaks through more that 2 pads per hour for an hour or more  If you bleed so much that you feel like you might pass out or you do pass out  If you have significant abdominal pain that is not improved with Tylenol   If you develop a fever > 100.5    Bland Diet A bland diet consists of foods that do not have a lot of fat or fiber. Foods without fat or fiber are easier for the body to digest. They are also less likely to irritate your mouth, throat, stomach, and other parts of your gastrointestinal tract. A bland diet is sometimes called a BRAT diet. What is my plan? Your health care provider or dietitian may recommend specific changes to your diet to prevent and treat your symptoms, such as:  Eating small meals often.  Cooking food until it is soft enough to chew easily.  Chewing your food well.  Drinking fluids slowly.  Not eating foods that are very spicy, sour, or fatty.  Not eating citrus fruits, such as oranges and grapefruit.  What do I need to know about this diet?  Eat a variety of foods from the bland diet food list.  Do not follow a bland diet longer than you have to.  Ask your health care provider whether you should take vitamins. What foods can I eat? Grains  Hot cereals, such as cream of wheat. Bread, crackers, or tortillas made from refined white flour. Rice. Vegetables Canned or cooked vegetables. Mashed or boiled potatoes. Fruits Bananas. Applesauce. Other types of cooked or canned fruit with the skin and seeds removed, such as canned peaches or pears. Meats and Other Protein Sources Scrambled eggs. Creamy peanut butter or other nut butters. Lean, well-cooked meats, such as chicken or fish. Tofu. Soups or broths. Dairy Low-fat dairy products, such as milk, cottage cheese, or yogurt. Beverages Water. Herbal tea. Apple juice. Sweets and Desserts Pudding. Custard. Fruit gelatin. Ice cream. Fats and  Oils Mild salad dressings. Canola or olive oil. The items listed above may not be a complete list of allowed foods or beverages. Contact your dietitian for more options. What foods are not recommended? Foods and ingredients that are often not recommended include:  Spicy foods, such as hot sauce or salsa.  Fried foods.  Sour foods, such as pickled or fermented foods.  Raw vegetables or fruits, especially citrus or berries.  Caffeinated drinks.  Alcohol.  Strongly flavored seasonings or condiments.  The items listed above may not be a complete list of foods and beverages that are not allowed. Contact your dietitian for more information. This information is not intended to replace advice given to you by your health care provider. Make sure you discuss any questions you have with your health care provider. Document Released: 12/21/2015 Document Revised: 02/04/2016 Document Reviewed: 09/10/2014 Elsevier Interactive Patient Education  2018 [employer]

## 2017-05-19 NOTE — MAU Provider Note (Signed)
History     CSN: [id number]  Arrival date and time: 05/19/17 1600  First Provider Initiated Contact with Patient 05/19/17 1646      Chief Complaint  Patient presents with  . Abdominal Pain  . Possible Pregnancy  . Diarrhea   HPI Courtney [name] is a 26 y.o. G3P1011 at 4253w0d who presents with abdominal pain. Symptoms began 2 days ago. Reports intermittent lower abdominal pain that occurs several times per day. Rates pain 5/10. Has not treated. Endorses nausea, no vomiting. Has had intermittent bouts of loose stools for the last 2 weeks. Denies fever/chills, dysuria, hematuria, vaginal discharge, or vaginal bleeding.   OB History    Gravida Para Term Preterm AB Living   3 1 1   1 1    SAB TAB Ectopic Multiple Live Births   1       1      Past Medical History:  Diagnosis Date  . Allergic rhinitis   . Back pain   . Common migraine with intractable migraine 12/16/2014  . Depression    denies  . Eczema   . GERD (gastroesophageal reflux disease)   . Infection    UTI  . Scoliosis   . Vitamin D deficiency     Past Surgical History:  Procedure Laterality Date  . CESAREAN SECTION    . DILATION AND EVACUATION N/A 05/06/2015   Procedure: DILATATION AND EVACUATION;  Surgeon: Edwinna Areolaecilia Worema Banga, DO;  Location: [facility];  Service: Gynecology;  Laterality: N/A;    Family History  Problem Relation Age of Onset  . Migraines Mother   . Asthma Mother   . Healthy Sister   . Healthy Brother   . Healthy Brother   . Healthy Brother   . Cancer Maternal Grandmother   . Cancer Paternal Grandfather     Social History  Substance Use Topics  . Smoking status: Never Smoker  . Smokeless tobacco: Never Used  . Alcohol use Yes     Comment: occasionally    Allergies:  Allergies  Allergen Reactions  . No Known Allergies     Prescriptions Prior to Admission  Medication Sig Dispense Refill Last Dose  . aspirin-acetaminophen-caffeine (EXCEDRIN MIGRAINE) 250-250-65 MG tablet Take 2  tablets by mouth every 6 (six) hours as needed for headache or migraine.   Past Month at Unknown time  . [DISCONTINUED] ondansetron (ZOFRAN) 4 MG tablet Take 1 tablet (4 mg total) by mouth every 8 (eight) hours as needed for nausea or vomiting. 20 tablet 0     Review of Systems  Constitutional: Negative.   Gastrointestinal: Positive for abdominal pain, diarrhea and nausea. Negative for constipation and vomiting.  Genitourinary: Negative.    Physical Exam   Blood pressure 123/64, pulse (!) 101, temperature 98.6 F (37 C), temperature source Oral, resp. rate 18, weight 190 lb 8 oz (86.4 kg), last menstrual period 04/14/2017, SpO2 99 %.  Physical Exam  Nursing note and vitals reviewed. Constitutional: She is oriented to person, place, and time. She appears well-developed and well-nourished. No distress.  HENT:  Head: Normocephalic and atraumatic.  Eyes: Conjunctivae are normal. Right eye exhibits no discharge. Left eye exhibits no discharge. No scleral icterus.  Neck: Normal range of motion.  Cardiovascular: Normal rate, regular rhythm and normal heart sounds.   No murmur heard. Respiratory: Effort normal and breath sounds normal. No respiratory distress. She has no wheezes.  GI: Soft. Bowel sounds are normal. She exhibits no distension. There is no tenderness. There  is no rebound and no guarding.  Neurological: She is alert and oriented to person, place, and time.  Skin: Skin is warm and dry. She is not diaphoretic.  Psychiatric: She has a normal mood and affect. Her behavior is normal. Judgment and thought content normal.    MAU Course  Procedures Results for orders placed or performed during the hospital encounter of 05/19/17 (from the past 24 hour(s))  Urinalysis, Routine w reflex microscopic     Status: Abnormal   Collection Time: 05/19/17  4:13 PM  Result Value Ref Range   Color, Urine YELLOW YELLOW   APPearance HAZY (A) CLEAR   Specific Gravity, Urine 1.020 1.005 - 1.030    pH 5.0 5.0 - 8.0   Glucose, UA NEGATIVE NEGATIVE mg/dL   Hgb urine dipstick SMALL (A) NEGATIVE   Bilirubin Urine NEGATIVE NEGATIVE   Ketones, ur NEGATIVE NEGATIVE mg/dL   Protein, ur NEGATIVE NEGATIVE mg/dL   Nitrite NEGATIVE NEGATIVE   Leukocytes, UA LARGE (A) NEGATIVE   RBC / HPF 0-5 0 - 5 RBC/hpf   WBC, UA 6-30 0 - 5 WBC/hpf   Bacteria, UA NONE SEEN NONE SEEN   Squamous Epithelial / LPF 0-5 (A) NONE SEEN   Mucus PRESENT   Pregnancy, urine POC     Status: Abnormal   Collection Time: 05/19/17  4:20 PM  Result Value Ref Range   Preg Test, Ur POSITIVE (A) NEGATIVE  CBC     Status: Abnormal   Collection Time: 05/19/17  5:03 PM  Result Value Ref Range   WBC 10.7 (H) 4.0 - 10.5 K/uL   RBC 3.97 3.87 - 5.11 MIL/uL   Hemoglobin 12.4 12.0 - 15.0 g/dL   HCT 16.1 (L) 09.6 - 04.5 %   MCV 90.2 78.0 - 100.0 fL   MCH 31.2 26.0 - 34.0 pg   MCHC 34.6 30.0 - 36.0 g/dL   RDW 40.9 81.1 - 91.4 %   Platelets 258 150 - 400 K/uL  ABO/Rh     Status: None   Collection Time: 05/19/17  5:03 PM  Result Value Ref Range   ABO/RH(D) A POS   hCG, quantitative, pregnancy     Status: Abnormal   Collection Time: 05/19/17  5:03 PM  Result Value Ref Range   hCG, Beta Chain, Quant, S 275 (H) <5 mIU/mL   US Ob Comp Less 14 Wks  Result Date: 05/19/2017 CLINICAL DATA:  Pregnant patient with lower abdominal/ pelvic pain. EXAM: OBSTETRIC <14 WK [place] AND TRANSVAGINAL OB US TECHNIQUE: Both transabdominal and transvaginal ultrasound examinations were performed for complete evaluation of the gestation as well as the maternal uterus, adnexal regions, and pelvic cul-de-sac. Transvaginal technique was performed to assess early pregnancy. COMPARISON:  Pelvic ultrasound 04/05/2015 FINDINGS: Intrauterine gestational sac: None Yolk sac:  Not Visualized. Embryo:  Not Visualized. Cardiac Activity: Not Visualized. Subchorionic hemorrhage:  None visualized. Maternal uterus/adnexae: Normal appearance of the left and right ovaries. No  free fluid in the pelvis. IMPRESSION: No intrauterine gestation identified. In the setting of positive pregnancy test and no definite intrauterine pregnancy, this reflects a pregnancy of unknown location. Differential considerations include early normal IUP, abnormal IUP, or nonvisualized ectopic pregnancy. Differentiation is achieved with serial beta HCG supplemented by repeat sonography as clinically warranted. Electronically Signed   By: Annia Belt M.D.   On: 05/19/2017 17:52   US Ob Transvaginal  Result Date: 05/19/2017 CLINICAL DATA:  Pregnant patient with lower abdominal/ pelvic pain. EXAM: OBSTETRIC <14 WK [place] AND  TRANSVAGINAL OB US TECHNIQUE: Both transabdominal and transvaginal ultrasound examinations were performed for complete evaluation of the gestation as well as the maternal uterus, adnexal regions, and pelvic cul-de-sac. Transvaginal technique was performed to assess early pregnancy. COMPARISON:  Pelvic ultrasound 04/05/2015 FINDINGS: Intrauterine gestational sac: None Yolk sac:  Not Visualized. Embryo:  Not Visualized. Cardiac Activity: Not Visualized. Subchorionic hemorrhage:  None visualized. Maternal uterus/adnexae: Normal appearance of the left and right ovaries. No free fluid in the pelvis. IMPRESSION: No intrauterine gestation identified. In the setting of positive pregnancy test and no definite intrauterine pregnancy, this reflects a pregnancy of unknown location. Differential considerations include early normal IUP, abnormal IUP, or nonvisualized ectopic pregnancy. Differentiation is achieved with serial beta HCG supplemented by repeat sonography as clinically warranted. Electronically Signed   By: Annia Belt M.D.   On: 05/19/2017 17:52    MDM +UPT UA, wet prep, GC/chlamydia, CBC, ABO/Rh, quant hCG, HIV, and [place] today to rule out ectopic pregnancy A positive BHCG 275, ultrasound shows no IUP or adnexal mass  This abdominal pain could represent a normal pregnancy, spontaneous  abortion, or even an ectopic pregnancy which can be life-threatening. Cultures were obtained to rule out pelvic infection.   Assessment and Plan  A:  1. Pregnancy of unknown anatomic location   2. Abdominal cramping affecting pregnancy    P: Discharge home Return to MAU Monday for repeat BHCG (can't come during clinic hours) Discussed reasons to return to MAU including s/s ectopic GC/CT & urine culture pending  Judeth Horn 05/19/2017, 4:46 PM

## 2017-05-20 LAB — CULTURE, OB URINE: Culture: <10000 — AB

## 2017-05-23 ENCOUNTER — Inpatient Hospital Stay (HOSPITAL_COMMUNITY)
Admission: AD | Admit: 2017-05-23 | Discharge: 2017-05-23 | Disposition: A | Discharge status: Home / Self Care | Payer: Medicaid Other | Source: Ambulatory Visit | Attending: Family Medicine | Admitting: Family Medicine

## 2017-05-23 DIAGNOSIS — O0281 Inappropriate change in quantitative human chorionic gonadotropin (hCG) in early pregnancy: Secondary | ICD-10-CM | POA: Insufficient documentation

## 2017-05-23 DIAGNOSIS — O3680X0 Pregnancy with inconclusive fetal viability, not applicable or unspecified: Secondary | ICD-10-CM

## 2017-05-23 DIAGNOSIS — Z5321 Procedure and treatment not carried out due to patient leaving prior to being seen by health care provider: Secondary | ICD-10-CM | POA: Diagnosis not present

## 2017-05-23 LAB — HCG, QUANTITATIVE, PREGNANCY: hCG, Beta Chain, Quant, S: 2104 m[IU]/mL — ABNORMAL HIGH (ref <5)

## 2017-05-23 NOTE — MAU Provider Note (Signed)
  History     CSN: [id number]  Arrival date and time: 05/23/17 1835   None     No chief complaint on file.  Courtney [name] is a 26 y.o. G3P1011 at 5760w4d who presents today for FU HCG. She was seen here on 05/19/17 with abdominal pain in pregnancy, pregnancy location unknown. I went to get the patient from the lobby, and she was not there. Unable to obtain ROS or PE.    Pelvic Pain  The patient'[name] pertinent negatives include no pelvic pain.      Past Medical History:  Diagnosis Date  . Allergic rhinitis   . Back pain   . Common migraine with intractable migraine 12/16/2014  . Depression    denies  . Eczema   . GERD (gastroesophageal reflux disease)   . Infection    UTI  . Scoliosis   . Vitamin D deficiency     Past Surgical History:  Procedure Laterality Date  . CESAREAN SECTION    . DILATION AND EVACUATION N/A 05/06/2015   Procedure: DILATATION AND EVACUATION;  Surgeon: Edwinna Areolaecilia Worema Banga, DO;  Location: [facility];  Service: Gynecology;  Laterality: N/A;    Family History  Problem Relation Age of Onset  . Migraines Mother   . Asthma Mother   . Healthy Sister   . Healthy Brother   . Healthy Brother   . Healthy Brother   . Cancer Maternal Grandmother   . Cancer Paternal Grandfather     Social History  Substance Use Topics  . Smoking status: Never Smoker  . Smokeless tobacco: Never Used  . Alcohol use Yes     Comment: occasionally    Allergies:  Allergies  Allergen Reactions  . No Known Allergies     No prescriptions prior to admission.    Review of Systems  Genitourinary: Negative for pelvic pain.   Physical Exam   Blood pressure 117/64, pulse 93, temperature 98.4 F (36.9 C), temperature source Oral, height 5\' 2"  (1.575 m), weight 191 lb 8 oz (86.9 kg), last menstrual period 04/14/2017.  Physical Exam  Nursing note and vitals reviewed.  Results for Courtney [name], Courtney [name] (MRN [id number]) as of 05/23/2017 21:17  Ref. Range 05/19/2017 17:03  05/19/2017 17:44 05/23/2017 19:26  HCG, Beta Chain, Quant, [name] Latest Ref Range: <5 mIU/mL 275 (H)  2,104 (H)   MAU Course  Procedures  MDM 2150: Called the patient, and left a message for her to call the unit.  HCG is rising, would like to repeat HCG in 48 hours and then schedule an US for later in the week.   Assessment and Plan   1. Pregnancy, location unknown   2. Inappropriate change in quantitative hCG in early pregnancy    Patient left AMA prior to being seen Unable to reach the patient on the phone.   Follow-up Information    [facility]   Specialty:  Obstetrics and Gynecology [facility]00 AM Contact information: [address] [place] [id number] [phone]           Thressa ShellerHeather Maila Dukes 05/23/2017, 9:18 PM

## 2017-05-23 NOTE — MAU Note (Signed)
NOT IN LOBBY 

## 2017-05-23 NOTE — Discharge Instructions (Signed)
Ectopic Pregnancy An ectopic pregnancy is when the fertilized egg attaches (implants) outside the uterus. Most ectopic pregnancies occur in one of the tubes where eggs travel from the ovary to the uterus (fallopian tubes), but the implanting can occur in other locations. In rare cases, ectopic pregnancies occur on the ovary, intestine, pelvis, abdomen, or cervix. In an ectopic pregnancy, the fertilized egg does not have the ability to develop into a normal, healthy baby. A ruptured ectopic pregnancy is one in which tearing or bursting of a fallopian tube causes internal bleeding. Often, there is intense lower abdominal pain, and vaginal bleeding sometimes occurs. Having an ectopic pregnancy can be life-threatening. If this dangerous condition is not treated, it can lead to blood loss, shock, or even death. What are the causes? The most common cause of this condition is damage to one of the fallopian tubes. A fallopian tube may be narrowed or blocked, and that keeps the fertilized egg from reaching the uterus. What increases the risk? This condition is more likely to develop in women of childbearing age who have different levels of risk. The levels of risk can be divided into three categories. High risk  You have gone through infertility treatment.  You have had an ectopic pregnancy before.  You have had surgery on the fallopian tubes, or another surgical procedure, such as an abortion.  You have had surgery to have the fallopian tubes tied (tubal ligation).  You have problems or diseases of the fallopian tubes.  You have been exposed to diethylstilbestrol (DES). This medicine was used until 1971, and it had effects on babies whose mothers took the medicine.  You become pregnant while using an IUD (intrauterine device) for birth control. Moderate risk  You have a history of infertility.  You have had an STI (sexually transmitted infection).  You have a history of pelvic inflammatory  disease (PID).  You have scarring from endometriosis.  You have multiple sexual partners.  You smoke. Low risk  You have had pelvic surgery.  You use vaginal douches.  You became sexually active before age 18. What are the signs or symptoms? Common symptoms of this condition include normal pregnancy symptoms, such as missing a period, nausea, tiredness, abdominal pain, breast tenderness, and bleeding. However, ectopic pregnancy will have additional symptoms, such as:  Pain with intercourse.  Irregular vaginal bleeding or spotting.  Cramping or pain on one side or in the lower abdomen.  Fast heartbeat, low blood pressure, and sweating.  Passing out while having a bowel movement.  Symptoms of a ruptured ectopic pregnancy and internal bleeding may include:  Sudden, severe pain in the abdomen and pelvis.  Dizziness, weakness, light-headedness, or fainting.  Pain in the shoulder or neck area.  How is this diagnosed? This condition is diagnosed by:  A pelvic exam to locate pain or a mass in the abdomen.  A pregnancy test. This blood test checks for the presence as well as the specific level of pregnancy hormone in the bloodstream.  Ultrasound. This is performed if a pregnancy test is positive. In this test, a probe is inserted into the vagina. The probe will detect a fetus, possibly in a location other than the uterus.  Taking a sample of uterus tissue (dilation and curettage, or D&C).  Surgery to perform a visual exam of the inside of the abdomen using a thin, lighted tube that has a tiny camera on the end (laparoscope).  Culdocentesis. This procedure involves inserting a needle at the top   of the vagina, behind the uterus. If blood is present in this area, it may indicate that a fallopian tube is torn.  How is this treated? This condition is treated with medicine or surgery. Medicine  An injection of a medicine (methotrexate) may be given to cause the pregnancy tissue  to be absorbed. This medicine may save your fallopian tube. It may be given if: ? The diagnosis is made early, with no signs of active bleeding. ? The fallopian tube has not ruptured. ? You are considered to be a good candidate for the medicine. Usually, pregnancy hormone blood levels are checked after methotrexate treatment. This is to be sure that the medicine is effective. It may take 4-6 weeks for the pregnancy to be absorbed. Most pregnancies will be absorbed by 3 weeks. Surgery  A laparoscope may be used to remove the pregnancy tissue.  If severe internal bleeding occurs, a larger cut (incision) may be made in the lower abdomen (laparotomy) to remove the fetus and placenta. This is done to stop the bleeding.  Part or all of the fallopian tube may be removed (salpingectomy) along with the fetus and placenta. The fallopian tube may also be repaired during the surgery.  In very rare circumstances, removal of the uterus (hysterectomy) may be required.  After surgery, pregnancy hormone testing may be done to be sure that there is no pregnancy tissue left. Whether your treatment is medicine or surgery, you may receive a Rho (D) immune globulin shot to prevent problems with any future pregnancy. This shot may be given if:  You are Rh-negative and the baby's father is Rh-positive.  You are Rh-negative and you do not know the Rh type of the baby's father.  Follow these instructions at home:  Rest and limit your activity after the procedure for as long as told by your health care provider.  Until your health care provider says that it is safe: ? Do not lift anything that is heavier than 10 lb (4.5 kg), or the limit that your health care provider tells you. ? Avoid physical exercise and any movement that requires effort (is strenuous).  To help prevent constipation: ? Eat a healthy diet that includes fruits, vegetables, and whole grains. ? Drink 6-8 glasses of water per day. Get help  right away if:  You develop worsening pain that is not relieved by medicine.  You have: ? A fever or chills. ? Vaginal bleeding. ? Redness and swelling at the incision site. ? Nausea and vomiting.  You feel dizzy or weak.  You feel light-headed or you faint. This information is not intended to replace advice given to you by your health care provider. Make sure you discuss any questions you have with your health care provider. Document Released: 10/06/2004 Document Revised: 04/27/2016 Document Reviewed: 03/30/2016 Elsevier Interactive Patient Education  2018 [employer]  

## 2017-05-23 NOTE — MAU Note (Signed)
PT SAYS  SHE WAS HERE LAST WEEK- WAS  SUPPOSE  TO FOLLOW-UP ON Monday - BUT SHE COULD NOT COME.   NO BLEEDING LAST WEEK - NOR TODAY.     SHE HAD ABD  PAIN LAST WEEK - LESS TONIGHT  .

## 2017-05-23 NOTE — MAU Note (Signed)
EXPLAINED TO PT TO WAIT IN LOBBY  FOR RESULTS AND PLAN OF  CARE.

## 2017-05-25 ENCOUNTER — Ambulatory Visit: Payer: Medicaid Other

## 2017-05-25 ENCOUNTER — Telehealth: Payer: Self-pay | Admitting: General Practice

## 2017-05-25 NOTE — Telephone Encounter (Signed)
Patient no showed for stat [facility] appt. Called patient, no answer- left message stating we are calling as you missed your appt in our office today for lab work. Please call our front office back to reschedule this appt. Will send letter

## 2017-06-02 ENCOUNTER — Ambulatory Visit: Payer: Medicaid Other | Admitting: *Deleted

## 2017-06-02 DIAGNOSIS — O3680X0 Pregnancy with inconclusive fetal viability, not applicable or unspecified: Secondary | ICD-10-CM

## 2017-06-02 LAB — HCG, QUANTITATIVE, PREGNANCY: hCG, Beta Chain, Quant, S: 27736 m[IU]/mL — ABNORMAL HIGH (ref <5)

## 2017-06-02 NOTE — Progress Notes (Signed)
Pt has pregnancy of unknown location. She is experiencing cramping but no bleeding. Pt agrees to wait in lobby for results.  Reviewed results with Dr. Adrian Blackwater and he recommends viability scan. Patient informed of this and is agreeable.  Ultrasound scheduled for 9/25 at 3pm. Pt states that shes not sure if she is coming. I advised that it is important for [place] to be sure that her pregnancy is in the proper location. She stated that she will think about it. I asked that she call us if she needs to change the appointment. Also advised that she go to MAU for any pain or bleeding. Patient voice understanding and had no further questions or concerns.

## 2017-06-06 ENCOUNTER — Ambulatory Visit (HOSPITAL_COMMUNITY)
Admission: RE | Admit: 2017-06-06 | Discharge: 2017-06-06 | Disposition: A | Discharge status: Home / Self Care | Payer: Medicaid Other | Source: Ambulatory Visit | Attending: Family Medicine | Admitting: Family Medicine

## 2017-06-06 ENCOUNTER — Ambulatory Visit: Payer: Medicaid Other | Admitting: *Deleted

## 2017-06-06 DIAGNOSIS — O3680X0 Pregnancy with inconclusive fetal viability, not applicable or unspecified: Secondary | ICD-10-CM

## 2017-06-06 DIAGNOSIS — Z3689 Encounter for other specified antenatal screening: Secondary | ICD-10-CM | POA: Insufficient documentation

## 2017-06-06 DIAGNOSIS — Z3A01 Less than 8 weeks gestation of pregnancy: Secondary | ICD-10-CM | POA: Diagnosis not present

## 2017-06-06 NOTE — Progress Notes (Signed)
Here for results of [place]. Informed us shows live baby at 31 w6da and we recommend  Her to start prenatal care. She plans to go to [facility].  Reviewed meds with her and she is only taking prenatal vitamins - instructed not to start any other unless checks with ob dr. May take delsym or robitussin for c/o cough.

## 2017-06-07 NOTE — Progress Notes (Signed)
Agree with nursing staff's documentation of this patient's clinic encounter.  Conan Bowens, MD

## 2017-06-08 ENCOUNTER — Telehealth: Payer: Self-pay | Admitting: *Deleted

## 2017-06-08 NOTE — Telephone Encounter (Signed)
-----   Message from Levie Heritage, DO sent at 06/06/2017  3:57 PM EDT ----- IUP with cardiac activity. Establish care

## 2017-06-09 ENCOUNTER — Telehealth: Payer: Self-pay

## 2017-06-09 NOTE — Telephone Encounter (Signed)
-----   Message from Jacob J Stinson, DO sent at 06/06/2017  3:57 PM EDT ----- IUP with cardiac activity. Establish care 

## 2017-06-09 NOTE — Telephone Encounter (Signed)
Call patient to inform her of test result and need to establish care with an ob gyn office of choice.

## 2017-06-12 DIAGNOSIS — E669 Obesity, unspecified: Secondary | ICD-10-CM | POA: Diagnosis present

## 2017-06-23 ENCOUNTER — Inpatient Hospital Stay (HOSPITAL_COMMUNITY)
Admission: AD | Admit: 2017-06-23 | Discharge: 2017-06-23 | Disposition: A | Discharge status: Home / Self Care | Payer: Medicaid Other | Source: Ambulatory Visit | Attending: Obstetrics & Gynecology | Admitting: Obstetrics & Gynecology

## 2017-06-23 ENCOUNTER — Encounter (HOSPITAL_COMMUNITY): Payer: Self-pay | Admitting: Emergency Medicine

## 2017-06-23 DIAGNOSIS — J069 Acute upper respiratory infection, unspecified: Secondary | ICD-10-CM | POA: Diagnosis not present

## 2017-06-23 DIAGNOSIS — O99281 Endocrine, nutritional and metabolic diseases complicating pregnancy, first trimester: Secondary | ICD-10-CM | POA: Diagnosis not present

## 2017-06-23 DIAGNOSIS — O9989 Other specified diseases and conditions complicating pregnancy, childbirth and the puerperium: Secondary | ICD-10-CM | POA: Diagnosis not present

## 2017-06-23 DIAGNOSIS — F329 Major depressive disorder, single episode, unspecified: Secondary | ICD-10-CM | POA: Insufficient documentation

## 2017-06-23 DIAGNOSIS — R05 Cough: Secondary | ICD-10-CM | POA: Insufficient documentation

## 2017-06-23 DIAGNOSIS — O99341 Other mental disorders complicating pregnancy, first trimester: Secondary | ICD-10-CM | POA: Diagnosis not present

## 2017-06-23 DIAGNOSIS — E559 Vitamin D deficiency, unspecified: Secondary | ICD-10-CM | POA: Diagnosis not present

## 2017-06-23 DIAGNOSIS — K219 Gastro-esophageal reflux disease without esophagitis: Secondary | ICD-10-CM | POA: Insufficient documentation

## 2017-06-23 DIAGNOSIS — Z3A10 10 weeks gestation of pregnancy: Secondary | ICD-10-CM | POA: Insufficient documentation

## 2017-06-23 DIAGNOSIS — O99511 Diseases of the respiratory system complicating pregnancy, first trimester: Secondary | ICD-10-CM | POA: Diagnosis present

## 2017-06-23 DIAGNOSIS — O99611 Diseases of the digestive system complicating pregnancy, first trimester: Secondary | ICD-10-CM | POA: Insufficient documentation

## 2017-06-23 LAB — INFLUENZA PANEL BY PCR (TYPE A & B)
INFLAPCR: NEGATIVE
Influenza B By PCR: NEGATIVE

## 2017-06-23 MED ORDER — IPRATROPIUM-ALBUTEROL 0.5-2.5 (3) MG/3ML IN SOLN
3.0000 mL | Freq: Four times a day (QID) | RESPIRATORY_TRACT | Status: DC
  Administered 2017-06-23: 3 mL via RESPIRATORY_TRACT
  Filled 2017-06-23: qty 3

## 2017-06-23 NOTE — MAU Provider Note (Signed)
History     CSN: [id number]  Arrival date and time: 06/23/17 9629   First Provider Initiated Contact with Patient 06/23/17 [phone]      Chief Complaint  Patient presents with  . Cough   Courtney [name] is a 26 y.o. G3P1011 at [redacted]w[redacted]d who presents today with a cough. She states that she has had this cough for about 3 weeks. She called her PCP yesterday, and was started on antibiotics. She has taken one dose. She reports no improvement after one dose. She denies any OB related complaints today.    Cough  This is a new problem. Episode onset: 3 weeks. The problem has been gradually worsening. The problem occurs every few minutes. The cough is non-productive. Pertinent negatives include no chills or fever. Nothing aggravates the symptoms. She has tried OTC cough suppressant (started on antibiotic yesterday. Has not taken a dose for today at this time. ) for the symptoms. The treatment provided no relief. There is no history of asthma.    Past Medical History:  Diagnosis Date  . Allergic rhinitis   . Back pain   . Common migraine with intractable migraine 12/16/2014  . Depression    denies  . Eczema   . GERD (gastroesophageal reflux disease)   . Infection    UTI  . Scoliosis   . Vitamin D deficiency     Past Surgical History:  Procedure Laterality Date  . CESAREAN SECTION    . DILATION AND EVACUATION N/A 05/06/2015   Procedure: DILATATION AND EVACUATION;  Surgeon: Edwinna Areola, DO;  Location: [facility];  Service: Gynecology;  Laterality: N/A;    Family History  Problem Relation Age of Onset  . Migraines Mother   . Asthma Mother   . Healthy Sister   . Healthy Brother   . Healthy Brother   . Healthy Brother   . Cancer Maternal Grandmother   . Cancer Paternal Grandfather     Social History  Substance Use Topics  . Smoking status: Never Smoker  . Smokeless tobacco: Never Used  . Alcohol use No     Comment: occasionally    Allergies:  Allergies  Allergen  Reactions  . No Known Allergies     No prescriptions prior to admission.    Review of Systems  Constitutional: Negative for chills and fever.  Respiratory: Positive for cough.   Gastrointestinal: Positive for vomiting (has vomtited from coughing ). Negative for nausea.  Genitourinary: Negative for pelvic pain, vaginal bleeding and vaginal discharge.   Physical Exam   Blood pressure 118/66, pulse (!) 102, temperature 98.5 F (36.9 C), temperature source Oral, resp. rate 17, height  (1.575 m), last menstrual period 04/14/2017, SpO2 97 %.  Physical Exam  Nursing note and vitals reviewed. Constitutional: She is oriented to person, place, and time. She appears well-developed and well-nourished. No distress.  HENT:  Head: Normocephalic.  Cardiovascular: Normal rate and regular rhythm.   Respiratory: Effort normal. She has wheezes.  GI: There is no tenderness.  Neurological: She is alert and oriented to person, place, and time.  Skin: Skin is warm and dry.  Psychiatric: She has a normal mood and affect.   Results for orders placed or performed during the hospital encounter of 06/23/17 (from the past 24 hour(s))  Influenza panel by PCR (type A & B)     Status: None   Collection Time: 06/23/17  7:51 AM  Result Value Ref Range   Influenza A By PCR NEGATIVE  NEGATIVE   Influenza B By PCR NEGATIVE NEGATIVE    MAU Course  Procedures  MDM Patient has had breathing treatment. She is feeling better at this time. Discussed with patient that she needs to continue antibiotics at this time, and it could take 24-48 hours of being on the antibiotic before she noticed improvement.   Assessment and Plan   1. URI with cough and congestion   2. [redacted] weeks gestation of pregnancy    DC home Comfort measures reviewed  1st/2nd Trimester precautions  RX: none, continue abx as already prescribed  Return to MAU as needed FU with OB as planned  Follow-up Information    Philip Aspen, DO  Follow up.   Specialty:  Obstetrics and Gynecology Contact information: [address] [place] [address] [phone]            Thressa Sheller 06/23/2017, 8:06 AM

## 2017-06-23 NOTE — MAU Note (Signed)
Pt complains of cough for 2 weeks and symptoms worsened 3 days ago. Coughing causes vomiting. Hard to breath while laying down.  Pt says she has tried over the counter cold meds and they have not helped.

## 2017-06-23 NOTE — Discharge Instructions (Signed)

## 2017-07-17 LAB — OB RESULTS CONSOLE HEPATITIS B SURFACE ANTIGEN: Hepatitis B Surface Ag: NEGATIVE

## 2017-07-17 LAB — OB RESULTS CONSOLE RPR: RPR: NONREACTIVE

## 2017-07-17 LAB — OB RESULTS CONSOLE GC/CHLAMYDIA: Gonorrhea: NEGATIVE

## 2017-07-17 LAB — OB RESULTS CONSOLE HIV ANTIBODY (ROUTINE TESTING): HIV: NONREACTIVE

## 2017-07-17 LAB — OB RESULTS CONSOLE RUBELLA ANTIBODY, IGM: RUBELLA: NON-IMMUNE/NOT IMMUNE

## 2017-11-20 ENCOUNTER — Encounter (HOSPITAL_COMMUNITY): Payer: Self-pay | Admitting: *Deleted

## 2017-11-20 ENCOUNTER — Other Ambulatory Visit: Payer: Self-pay

## 2017-11-20 ENCOUNTER — Inpatient Hospital Stay (HOSPITAL_COMMUNITY)
Admission: AD | Admit: 2017-11-20 | Discharge: 2017-11-20 | Disposition: A | Discharge status: Home / Self Care | Payer: Medicaid Other | Source: Ambulatory Visit | Attending: Obstetrics and Gynecology | Admitting: Obstetrics and Gynecology

## 2017-11-20 DIAGNOSIS — R05 Cough: Secondary | ICD-10-CM | POA: Diagnosis present

## 2017-11-20 DIAGNOSIS — R1012 Left upper quadrant pain: Secondary | ICD-10-CM

## 2017-11-20 DIAGNOSIS — O99513 Diseases of the respiratory system complicating pregnancy, third trimester: Secondary | ICD-10-CM | POA: Insufficient documentation

## 2017-11-20 DIAGNOSIS — Z3A31 31 weeks gestation of pregnancy: Secondary | ICD-10-CM | POA: Insufficient documentation

## 2017-11-20 DIAGNOSIS — O99343 Other mental disorders complicating pregnancy, third trimester: Secondary | ICD-10-CM | POA: Insufficient documentation

## 2017-11-20 DIAGNOSIS — J111 Influenza due to unidentified influenza virus with other respiratory manifestations: Secondary | ICD-10-CM | POA: Diagnosis not present

## 2017-11-20 DIAGNOSIS — K219 Gastro-esophageal reflux disease without esophagitis: Secondary | ICD-10-CM | POA: Diagnosis not present

## 2017-11-20 DIAGNOSIS — O99613 Diseases of the digestive system complicating pregnancy, third trimester: Secondary | ICD-10-CM | POA: Diagnosis not present

## 2017-11-20 DIAGNOSIS — R6889 Other general symptoms and signs: Secondary | ICD-10-CM

## 2017-11-20 DIAGNOSIS — R509 Fever, unspecified: Secondary | ICD-10-CM | POA: Diagnosis present

## 2017-11-20 DIAGNOSIS — O26893 Other specified pregnancy related conditions, third trimester: Secondary | ICD-10-CM

## 2017-11-20 DIAGNOSIS — F329 Major depressive disorder, single episode, unspecified: Secondary | ICD-10-CM | POA: Insufficient documentation

## 2017-11-20 DIAGNOSIS — O99283 Endocrine, nutritional and metabolic diseases complicating pregnancy, third trimester: Secondary | ICD-10-CM | POA: Diagnosis not present

## 2017-11-20 DIAGNOSIS — E559 Vitamin D deficiency, unspecified: Secondary | ICD-10-CM | POA: Insufficient documentation

## 2017-11-20 MED ORDER — GUAIFENESIN ER 600 MG PO TB12: 600.0000 mg | ORAL_TABLET | Freq: Two times a day (BID) | ORAL | 1 refills | Status: DC

## 2017-11-20 NOTE — MAU Note (Signed)
Fever yesterday Tmax 102.0, cough since 7 days. Pt denies VB, LOF, contractions. +FM

## 2017-11-20 NOTE — MAU Provider Note (Signed)
Chief Complaint:  Cough and Fever   First Provider Initiated Contact with Patient 11/20/17 1752     HPI: Courtney [name] is a 27 y.o. G3P1011 at 6031w3dwho presents to maternity admissions reporting flu symptoms and pain in left upper abdomen when she coughs. States was wheezing last night but not today. Was prescribed Tamiflu "based on symptoms" over phone.  . She reports good fetal movement, denies LOF, vaginal bleeding, vaginal itching/burning, urinary symptoms, h/a, dizziness, n/v, diarrhea, constipation or fever/chills.   Cough  This is a new problem. The current episode started in the past 7 days. The problem has been unchanged. The cough is non-productive. Associated symptoms include chills, a fever, nasal congestion, rhinorrhea and wheezing (last night not today). Pertinent negatives include no chest pain, hemoptysis or shortness of breath. Treatments tried: Tamiflu. The treatment provided mild relief. There is no history of asthma or bronchitis.  Fever   This is a new problem. The current episode started in the past 7 days. The problem occurs intermittently. The problem has been waxing and waning. The maximum temperature noted was 102 to 102.9 F. Associated symptoms include abdominal pain (LUQ), coughing, sleepiness and wheezing (last night not today). Pertinent negatives include no chest pain, muscle aches, nausea or vomiting.    RN Note: Fever yesterday Tmax 102.0, cough since 7 days. Pt denies VB, LOF, contractions. +FM Pt reports she was diagnosed with the flu based on symptoms (no flu swab). Reports severe cough, pain on left side ?from coughing, wheezing. Currently taking tamiflu. Fever yesterday 102.0. Tired     Past Medical History: Past Medical History:  Diagnosis Date  . Allergic rhinitis   . Back pain   . Common migraine with intractable migraine 12/16/2014  . Depression    denies  . Eczema   . GERD (gastroesophageal reflux disease)   . Infection    UTI  .  Scoliosis   . Vitamin D deficiency     Past obstetric history: OB History  Gravida Para Term Preterm AB Living  3 1 1   1 1   SAB TAB Ectopic Multiple Live Births  1       1    # Outcome Date GA Lbr Len/2nd Weight Sex Delivery Anes PTL Lv  3 Current           2 Term      Vag-Spont   LIV  1 SAB               Past Surgical History: Past Surgical History:  Procedure Laterality Date  . CESAREAN SECTION    . DILATION AND EVACUATION N/A 05/06/2015   Procedure: DILATATION AND EVACUATION;  Surgeon: Edwinna Areolaecilia Worema Banga, DO;  Location: [facility];  Service: Gynecology;  Laterality: N/A;    Family History: Family History  Problem Relation Age of Onset  . Migraines Mother   . Asthma Mother   . Healthy Sister   . Healthy Brother   . Healthy Brother   . Healthy Brother   . Cancer Maternal Grandmother   . Cancer Paternal Grandfather     Social History: Social History   Tobacco Use  . Smoking status: Never Smoker  . Smokeless tobacco: Never Used  Substance Use Topics  . Alcohol use: No    Comment: occasionally  . Drug use: No    Allergies:  Allergies  Allergen Reactions  . No Known Allergies     Meds:  No medications prior to admission.    I  have reviewed patient's Past Medical Hx, Surgical Hx, Family Hx, Social Hx, medications and allergies.   ROS:  Review of Systems  Constitutional: Positive for chills and fever.  HENT: Positive for rhinorrhea.   Respiratory: Positive for cough and wheezing (last night not today). Negative for hemoptysis and shortness of breath.   Cardiovascular: Negative for chest pain.  Gastrointestinal: Positive for abdominal pain (LUQ). Negative for nausea and vomiting.   Other systems negative  Physical Exam   Patient Vitals for the past 24 hrs:  BP Temp Pulse Resp SpO2 Height Weight  11/20/17 1526 (!) 89/59 98.1 F (36.7 C) 96 18 97 % 5\' 2"  (1.575 m) 194 lb (88 kg)   Constitutional: Well-developed, well-nourished female in no acute  distress.  Cardiovascular: normal rate and rhythm Respiratory: normal effort, clear to auscultation bilaterally       Oxygen sats 98-99% GI: Abd soft, non-tender, gravid appropriate for gestational age.   No rebound or guarding. MS: Extremities nontender, no edema, normal ROM Neurologic: Alert and oriented x 4.  GU: Neg CVAT.  PELVIC EXAM: deferred  FHT:  Baseline 135 , moderate variability, accelerations present, no decelerations Contractions:   Rare   Labs: No results found for this or any previous visit (from the past 24 hour(s)).  --/--/A POS (09/07 1703)  Imaging:  No results found.  MAU Course/MDM: NST reviewed and is reassuring Consult Dr Claiborne Billings with presentation, exam findings and test results.  Treatments in MAU included NST Discussed supportive treatment for flu.   Increase water intake. Tylenol for fever.    Assessment: SIUP at [redacted]w[redacted]d Influenza like illness  Plan: Discharge home Continue Tamiflu Rx Mucinex for cough Preterm Labor precautions and fetal kick counts Follow up in Office for prenatal visits and recheck of status  Encouraged to return here or to other Urgent Care/ED if she develops worsening of symptoms, increase in pain, fever, or other concerning symptoms.   Pt stable at time of discharge.  Wynelle Bourgeois CNM, MSN Certified Nurse-Midwife 11/20/2017 5:52 PM

## 2017-11-20 NOTE — Discharge Instructions (Signed)

## 2017-11-20 NOTE — MAU Note (Signed)
Urine in lab 

## 2017-11-20 NOTE — MAU Note (Signed)
Pt reports she was diagnosed with the flu based on symptoms (no flu swab). Reports severe cough, pain on left side ?from coughing, wheezing. Currently taking tamiflu. Fever yesterday 102.0. Tired.

## 2017-12-26 LAB — OB RESULTS CONSOLE GBS: GBS: NEGATIVE

## 2018-01-17 ENCOUNTER — Inpatient Hospital Stay (HOSPITAL_COMMUNITY): Payer: Medicaid Other | Admitting: Anesthesiology

## 2018-01-17 ENCOUNTER — Inpatient Hospital Stay (HOSPITAL_COMMUNITY)
Admission: AD | Admit: 2018-01-17 | Discharge: 2018-01-19 | DRG: 788 | Disposition: A | Discharge status: Home / Self Care | Payer: Medicaid Other | Source: Ambulatory Visit | Attending: Obstetrics and Gynecology | Admitting: Obstetrics and Gynecology

## 2018-01-17 ENCOUNTER — Encounter (HOSPITAL_COMMUNITY): Payer: Self-pay

## 2018-01-17 ENCOUNTER — Other Ambulatory Visit: Payer: Self-pay

## 2018-01-17 ENCOUNTER — Encounter (HOSPITAL_COMMUNITY)
Admission: AD | Disposition: A | Discharge status: Home / Self Care | Payer: Self-pay | Source: Ambulatory Visit | Attending: Obstetrics and Gynecology

## 2018-01-17 DIAGNOSIS — O021 Missed abortion: Secondary | ICD-10-CM

## 2018-01-17 DIAGNOSIS — Z3480 Encounter for supervision of other normal pregnancy, unspecified trimester: Secondary | ICD-10-CM

## 2018-01-17 DIAGNOSIS — Z3A39 39 weeks gestation of pregnancy: Secondary | ICD-10-CM

## 2018-01-17 DIAGNOSIS — O9962 Diseases of the digestive system complicating childbirth: Secondary | ICD-10-CM | POA: Diagnosis present

## 2018-01-17 DIAGNOSIS — Z3490 Encounter for supervision of normal pregnancy, unspecified, unspecified trimester: Secondary | ICD-10-CM

## 2018-01-17 DIAGNOSIS — Z3483 Encounter for supervision of other normal pregnancy, third trimester: Secondary | ICD-10-CM | POA: Diagnosis present

## 2018-01-17 DIAGNOSIS — K219 Gastro-esophageal reflux disease without esophagitis: Secondary | ICD-10-CM | POA: Diagnosis present

## 2018-01-17 HISTORY — DX: Encounter for supervision of other normal pregnancy, unspecified trimester: Z34.80

## 2018-01-17 HISTORY — DX: Encounter for supervision of normal pregnancy, unspecified, unspecified trimester: Z34.90

## 2018-01-17 LAB — CBC
HCT: 33.4 % — ABNORMAL LOW (ref 36.0–46.0)
Hemoglobin: 11.2 g/dL — ABNORMAL LOW (ref 12.0–15.0)
MCH: 31.3 pg (ref 26.0–34.0)
MCHC: 33.5 g/dL (ref 30.0–36.0)
MCV: 93.3 fL (ref 78.0–100.0)
PLATELETS: 179 10*3/uL (ref 150–400)
RBC: 3.58 MIL/uL — ABNORMAL LOW (ref 3.87–5.11)
RDW: 14.1 % (ref 11.5–15.5)
WBC: 11.1 10*3/uL — ABNORMAL HIGH (ref 4.0–10.5)

## 2018-01-17 LAB — TYPE AND SCREEN
ABO/RH(D): A POS
Antibody Screen: NEGATIVE

## 2018-01-17 SURGERY — Surgical Case
Anesthesia: Epidural | Site: Abdomen | Wound class: Clean Contaminated

## 2018-01-17 MED ORDER — PROPOFOL 500 MG/50ML IV EMUL: INTRAVENOUS | Status: DC | PRN

## 2018-01-17 MED ORDER — OXYTOCIN 40 UNITS IN LACTATED RINGERS INFUSION - SIMPLE MED
INTRAVENOUS | Status: AC
  Filled 2018-01-17: qty 1000

## 2018-01-17 MED ORDER — EPHEDRINE 5 MG/ML INJ: 10.0000 mg | INTRAVENOUS | Status: DC | PRN

## 2018-01-17 MED ORDER — MIDAZOLAM HCL 2 MG/2ML IJ SOLN
INTRAMUSCULAR | Status: AC
  Filled 2018-01-17: qty 2

## 2018-01-17 MED ORDER — SUCCINYLCHOLINE CHLORIDE 200 MG/10ML IV SOSY
PREFILLED_SYRINGE | INTRAVENOUS | Status: AC
Start: 2018-01-17 — End: ?
  Filled 2018-01-17: qty 10

## 2018-01-17 MED ORDER — SCOPOLAMINE 1 MG/3DAYS TD PT72
MEDICATED_PATCH | TRANSDERMAL | Status: DC | PRN
  Administered 2018-01-17: 1 via TRANSDERMAL

## 2018-01-17 MED ORDER — LACTATED RINGERS IV SOLN: INTRAVENOUS | Status: DC

## 2018-01-17 MED ORDER — LACTATED RINGERS IV SOLN
INTRAVENOUS | Status: DC | PRN
  Administered 2018-01-17 (×2): via INTRAVENOUS

## 2018-01-17 MED ORDER — MIDAZOLAM HCL 2 MG/2ML IJ SOLN
INTRAMUSCULAR | Status: DC | PRN
  Administered 2018-01-17: 2 mg via INTRAVENOUS

## 2018-01-17 MED ORDER — HYDROMORPHONE HCL 1 MG/ML IJ SOLN: 0.2500 mg | INTRAMUSCULAR | Status: DC | PRN

## 2018-01-17 MED ORDER — FENTANYL CITRATE (PF) 250 MCG/5ML IJ SOLN
INTRAMUSCULAR | Status: AC
  Filled 2018-01-17: qty 5

## 2018-01-17 MED ORDER — CEFAZOLIN SODIUM-DEXTROSE 2-3 GM-%(50ML) IV SOLR
INTRAVENOUS | Status: DC | PRN
  Administered 2018-01-17: 2 g via INTRAVENOUS

## 2018-01-17 MED ORDER — SIMETHICONE 80 MG PO CHEW
80.0000 mg | CHEWABLE_TABLET | Freq: Three times a day (TID) | ORAL | Status: DC
  Administered 2018-01-18 – 2018-01-19 (×4): 80 mg via ORAL
  Filled 2018-01-17 (×5): qty 1

## 2018-01-17 MED ORDER — NALBUPHINE HCL 10 MG/ML IJ SOLN: 5.0000 mg | Freq: Once | INTRAMUSCULAR | Status: DC | PRN

## 2018-01-17 MED ORDER — DEXAMETHASONE SODIUM PHOSPHATE 10 MG/ML IJ SOLN
INTRAMUSCULAR | Status: AC
  Filled 2018-01-17: qty 1

## 2018-01-17 MED ORDER — ONDANSETRON HCL 4 MG/2ML IJ SOLN: 4.0000 mg | Freq: Four times a day (QID) | INTRAMUSCULAR | Status: DC | PRN

## 2018-01-17 MED ORDER — MEPERIDINE HCL 25 MG/ML IJ SOLN: 6.2500 mg | INTRAMUSCULAR | Status: DC | PRN

## 2018-01-17 MED ORDER — SUCCINYLCHOLINE CHLORIDE 20 MG/ML IJ SOLN
INTRAMUSCULAR | Status: DC | PRN
  Administered 2018-01-17: 120 mg via INTRAVENOUS

## 2018-01-17 MED ORDER — SIMETHICONE 80 MG PO CHEW: 80.0000 mg | CHEWABLE_TABLET | ORAL | Status: DC | PRN

## 2018-01-17 MED ORDER — ONDANSETRON HCL 4 MG/2ML IJ SOLN: 4.0000 mg | Freq: Three times a day (TID) | INTRAMUSCULAR | Status: DC | PRN

## 2018-01-17 MED ORDER — OXYTOCIN BOLUS FROM INFUSION: 500.0000 mL | Freq: Once | INTRAVENOUS | Status: DC

## 2018-01-17 MED ORDER — PROMETHAZINE HCL 25 MG/ML IJ SOLN: 6.2500 mg | INTRAMUSCULAR | Status: DC | PRN

## 2018-01-17 MED ORDER — SOD CITRATE-CITRIC ACID 500-334 MG/5ML PO SOLN: 30.0000 mL | ORAL | Status: DC | PRN

## 2018-01-17 MED ORDER — PRENATAL MULTIVITAMIN CH
1.0000 | ORAL_TABLET | Freq: Every day | ORAL | Status: DC
  Administered 2018-01-18: 1 via ORAL
  Filled 2018-01-17: qty 1

## 2018-01-17 MED ORDER — SCOPOLAMINE 1 MG/3DAYS TD PT72: 1.0000 | MEDICATED_PATCH | Freq: Once | TRANSDERMAL | Status: DC

## 2018-01-17 MED ORDER — TETANUS-DIPHTH-ACELL PERTUSSIS 5-2.5-18.5 LF-MCG/0.5 IM SUSP: 0.5000 mL | Freq: Once | INTRAMUSCULAR | Status: DC

## 2018-01-17 MED ORDER — ACETAMINOPHEN 325 MG PO TABS: 650.0000 mg | ORAL_TABLET | ORAL | Status: DC | PRN

## 2018-01-17 MED ORDER — LACTATED RINGERS IV SOLN
INTRAVENOUS | Status: DC
  Administered 2018-01-17: 125 mL/h via INTRAVENOUS

## 2018-01-17 MED ORDER — HYDROMORPHONE HCL 1 MG/ML IJ SOLN
INTRAMUSCULAR | Status: DC | PRN
  Administered 2018-01-17: 1 mg via INTRAVENOUS

## 2018-01-17 MED ORDER — PHENYLEPHRINE HCL 10 MG/ML IJ SOLN
INTRAMUSCULAR | Status: DC | PRN
  Administered 2018-01-17 (×13): 80 ug via INTRAVENOUS

## 2018-01-17 MED ORDER — OXYTOCIN 40 UNITS IN LACTATED RINGERS INFUSION - SIMPLE MED: 2.5000 [IU]/h | INTRAVENOUS | Status: AC

## 2018-01-17 MED ORDER — IBUPROFEN 600 MG PO TABS
600.0000 mg | ORAL_TABLET | Freq: Four times a day (QID) | ORAL | Status: DC
  Administered 2018-01-17 – 2018-01-19 (×6): 600 mg via ORAL
  Filled 2018-01-17 (×6): qty 1

## 2018-01-17 MED ORDER — HYDROMORPHONE HCL 1 MG/ML IJ SOLN
INTRAMUSCULAR | Status: AC
  Filled 2018-01-17: qty 1

## 2018-01-17 MED ORDER — LIDOCAINE-EPINEPHRINE (PF) 2 %-1:200000 IJ SOLN
INTRAMUSCULAR | Status: DC | PRN
  Administered 2018-01-17: 10 mL via EPIDURAL
  Administered 2018-01-17: 5 mL via EPIDURAL

## 2018-01-17 MED ORDER — MORPHINE SULFATE (PF) 0.5 MG/ML IJ SOLN
INTRAMUSCULAR | Status: DC | PRN
  Administered 2018-01-17: 3 mg via EPIDURAL

## 2018-01-17 MED ORDER — OXYCODONE-ACETAMINOPHEN 5-325 MG PO TABS
1.0000 | ORAL_TABLET | ORAL | Status: DC | PRN
  Administered 2018-01-19 (×2): 1 via ORAL
  Filled 2018-01-17 (×2): qty 1

## 2018-01-17 MED ORDER — DIBUCAINE 1 % RE OINT: 1.0000 "application " | TOPICAL_OINTMENT | RECTAL | Status: DC | PRN

## 2018-01-17 MED ORDER — MORPHINE SULFATE (PF) 10 MG/ML IV SOLN: INTRAVENOUS | Status: DC | PRN

## 2018-01-17 MED ORDER — DEXAMETHASONE SODIUM PHOSPHATE 10 MG/ML IJ SOLN
INTRAMUSCULAR | Status: DC | PRN
  Administered 2018-01-17: 10 mg via INTRAVENOUS

## 2018-01-17 MED ORDER — LIDOCAINE HCL (PF) 1 % IJ SOLN: 30.0000 mL | INTRAMUSCULAR | Status: DC | PRN

## 2018-01-17 MED ORDER — NALOXONE HCL 4 MG/10ML IJ SOLN
1.0000 ug/kg/h | INTRAVENOUS | Status: DC | PRN
  Filled 2018-01-17: qty 5

## 2018-01-17 MED ORDER — WITCH HAZEL-GLYCERIN EX PADS: 1.0000 "application " | MEDICATED_PAD | CUTANEOUS | Status: DC | PRN

## 2018-01-17 MED ORDER — LIDOCAINE HCL (PF) 1 % IJ SOLN
INTRAMUSCULAR | Status: DC | PRN
  Administered 2018-01-17 (×2): 4 mL via EPIDURAL

## 2018-01-17 MED ORDER — OXYTOCIN 40 UNITS IN LACTATED RINGERS INFUSION - SIMPLE MED: 2.5000 [IU]/h | INTRAVENOUS | Status: DC

## 2018-01-17 MED ORDER — OXYCODONE-ACETAMINOPHEN 5-325 MG PO TABS: 1.0000 | ORAL_TABLET | ORAL | Status: DC | PRN

## 2018-01-17 MED ORDER — OXYCODONE-ACETAMINOPHEN 5-325 MG PO TABS
2.0000 | ORAL_TABLET | ORAL | Status: DC | PRN
Start: 2018-01-17 — End: 2018-01-19
  Administered 2018-01-18: 2 via ORAL
  Filled 2018-01-17: qty 2

## 2018-01-17 MED ORDER — NALBUPHINE HCL 10 MG/ML IJ SOLN: 5.0000 mg | INTRAMUSCULAR | Status: DC | PRN

## 2018-01-17 MED ORDER — NALOXONE HCL 0.4 MG/ML IJ SOLN: 0.4000 mg | INTRAMUSCULAR | Status: DC | PRN

## 2018-01-17 MED ORDER — CEFAZOLIN SODIUM-DEXTROSE 2-4 GM/100ML-% IV SOLN
INTRAVENOUS | Status: AC
  Filled 2018-01-17: qty 100

## 2018-01-17 MED ORDER — FENTANYL 2.5 MCG/ML BUPIVACAINE 1/10 % EPIDURAL INFUSION (WH - ANES)
14.0000 mL/h | INTRAMUSCULAR | Status: DC | PRN
  Filled 2018-01-17: qty 100

## 2018-01-17 MED ORDER — SIMETHICONE 80 MG PO CHEW
80.0000 mg | CHEWABLE_TABLET | ORAL | Status: DC
  Administered 2018-01-18: 80 mg via ORAL
  Filled 2018-01-17 (×2): qty 1

## 2018-01-17 MED ORDER — PHENYLEPHRINE 40 MCG/ML (10ML) SYRINGE FOR IV PUSH (FOR BLOOD PRESSURE SUPPORT): 80.0000 ug | PREFILLED_SYRINGE | INTRAVENOUS | Status: DC | PRN

## 2018-01-17 MED ORDER — SODIUM CHLORIDE 0.9 % IR SOLN
Status: DC | PRN
  Administered 2018-01-17: 1

## 2018-01-17 MED ORDER — SENNOSIDES-DOCUSATE SODIUM 8.6-50 MG PO TABS
2.0000 | ORAL_TABLET | ORAL | Status: DC
  Administered 2018-01-17 – 2018-01-18 (×2): 2 via ORAL
  Filled 2018-01-17 (×2): qty 2

## 2018-01-17 MED ORDER — OXYCODONE-ACETAMINOPHEN 5-325 MG PO TABS: 2.0000 | ORAL_TABLET | ORAL | Status: DC | PRN

## 2018-01-17 MED ORDER — DIPHENHYDRAMINE HCL 25 MG PO CAPS
25.0000 mg | ORAL_CAPSULE | Freq: Four times a day (QID) | ORAL | Status: DC | PRN
  Administered 2018-01-18: 25 mg via ORAL
  Filled 2018-01-17: qty 1

## 2018-01-17 MED ORDER — ZOLPIDEM TARTRATE 5 MG PO TABS: 5.0000 mg | ORAL_TABLET | Freq: Every evening | ORAL | Status: DC | PRN

## 2018-01-17 MED ORDER — KETOROLAC TROMETHAMINE 30 MG/ML IJ SOLN: 30.0000 mg | Freq: Once | INTRAMUSCULAR | Status: DC | PRN

## 2018-01-17 MED ORDER — OXYTOCIN 10 UNIT/ML IJ SOLN
INTRAVENOUS | Status: DC | PRN
  Administered 2018-01-17: 40 [IU] via INTRAVENOUS

## 2018-01-17 MED ORDER — FENTANYL CITRATE (PF) 100 MCG/2ML IJ SOLN
INTRAMUSCULAR | Status: DC | PRN
  Administered 2018-01-17: 250 ug via INTRAVENOUS

## 2018-01-17 MED ORDER — SCOPOLAMINE 1 MG/3DAYS TD PT72
MEDICATED_PATCH | TRANSDERMAL | Status: AC
Start: 2018-01-17 — End: ?
  Filled 2018-01-17: qty 1

## 2018-01-17 MED ORDER — PROPOFOL 10 MG/ML IV BOLUS
INTRAVENOUS | Status: DC | PRN
  Administered 2018-01-17: 180 mg via INTRAVENOUS

## 2018-01-17 MED ORDER — PHENYLEPHRINE 40 MCG/ML (10ML) SYRINGE FOR IV PUSH (FOR BLOOD PRESSURE SUPPORT)
80.0000 ug | PREFILLED_SYRINGE | INTRAVENOUS | Status: DC | PRN
  Filled 2018-01-17: qty 10

## 2018-01-17 MED ORDER — PHENYLEPHRINE 40 MCG/ML (10ML) SYRINGE FOR IV PUSH (FOR BLOOD PRESSURE SUPPORT)
PREFILLED_SYRINGE | INTRAVENOUS | Status: AC
  Filled 2018-01-17: qty 30

## 2018-01-17 MED ORDER — MORPHINE SULFATE (PF) 0.5 MG/ML IJ SOLN
INTRAMUSCULAR | Status: AC
  Filled 2018-01-17: qty 10

## 2018-01-17 MED ORDER — LACTATED RINGERS IV SOLN: 500.0000 mL | INTRAVENOUS | Status: DC | PRN

## 2018-01-17 MED ORDER — ONDANSETRON HCL 4 MG/2ML IJ SOLN
INTRAMUSCULAR | Status: DC | PRN
  Administered 2018-01-17: 4 mg via INTRAVENOUS

## 2018-01-17 MED ORDER — ONDANSETRON HCL 4 MG/2ML IJ SOLN
INTRAMUSCULAR | Status: AC
  Filled 2018-01-17: qty 2

## 2018-01-17 MED ORDER — SODIUM CHLORIDE 0.9% FLUSH: 3.0000 mL | INTRAVENOUS | Status: DC | PRN

## 2018-01-17 MED ORDER — MENTHOL 3 MG MT LOZG: 1.0000 | LOZENGE | OROMUCOSAL | Status: DC | PRN

## 2018-01-17 MED ORDER — LACTATED RINGERS IV SOLN: 500.0000 mL | Freq: Once | INTRAVENOUS | Status: DC

## 2018-01-17 MED ORDER — OXYTOCIN 10 UNIT/ML IJ SOLN
INTRAMUSCULAR | Status: AC
  Filled 2018-01-17: qty 4

## 2018-01-17 MED ORDER — DIPHENHYDRAMINE HCL 25 MG PO CAPS
25.0000 mg | ORAL_CAPSULE | ORAL | Status: DC | PRN
  Filled 2018-01-17: qty 1

## 2018-01-17 MED ORDER — PROPOFOL 10 MG/ML IV BOLUS: INTRAVENOUS | Status: DC | PRN

## 2018-01-17 MED ORDER — ACETAMINOPHEN 325 MG PO TABS
650.0000 mg | ORAL_TABLET | ORAL | Status: DC | PRN
Start: 2018-01-17 — End: 2018-01-19
  Administered 2018-01-18: 650 mg via ORAL
  Filled 2018-01-17: qty 2

## 2018-01-17 MED ORDER — FENTANYL 2.5 MCG/ML BUPIVACAINE 1/10 % EPIDURAL INFUSION (WH - ANES)
14.0000 mL/h | INTRAMUSCULAR | Status: DC | PRN
  Administered 2018-01-17: 14 mL/h via EPIDURAL

## 2018-01-17 MED ORDER — DIPHENHYDRAMINE HCL 50 MG/ML IJ SOLN: 12.5000 mg | INTRAMUSCULAR | Status: DC | PRN

## 2018-01-17 MED ORDER — COCONUT OIL OIL: 1.0000 "application " | TOPICAL_OIL | Status: DC | PRN

## 2018-01-17 MED ORDER — LIDOCAINE HCL (PF) 1 % IJ SOLN
INTRAMUSCULAR | Status: AC
  Filled 2018-01-17: qty 30

## 2018-01-17 SURGICAL SUPPLY — 32 items
CHLORAPREP W/TINT 26ML (MISCELLANEOUS) ×2 IMPLANT
CLAMP CORD UMBIL (MISCELLANEOUS) IMPLANT
CLOTH BEACON ORANGE TIMEOUT ST (SAFETY) ×2 IMPLANT
DRSG OPSITE POSTOP 4X10 (GAUZE/BANDAGES/DRESSINGS) ×2 IMPLANT
ELECT REM PT RETURN 9FT ADLT (ELECTROSURGICAL) ×2
ELECTRODE REM PT RTRN 9FT ADLT (ELECTROSURGICAL) ×1 IMPLANT
EXTRACTOR VACUUM M CUP 4 TUBE (SUCTIONS) IMPLANT
GLOVE BIOGEL PI IND STRL 6.5 (GLOVE) ×1 IMPLANT
GLOVE BIOGEL PI IND STRL 7.0 (GLOVE) ×1 IMPLANT
GLOVE BIOGEL PI INDICATOR 6.5 (GLOVE) ×1
GLOVE BIOGEL PI INDICATOR 7.0 (GLOVE) ×1
GLOVE ECLIPSE 6.5 STRL STRAW (GLOVE) ×2 IMPLANT
GOWN STRL REUS W/TWL LRG LVL3 (GOWN DISPOSABLE) ×4 IMPLANT
KIT ABG SYR 3ML LUER SLIP (SYRINGE) IMPLANT
NDL HYPO 25X5/8 SAFETYGLIDE (NEEDLE) IMPLANT
NEEDLE HYPO 25X5/8 SAFETYGLIDE (NEEDLE) IMPLANT
NS IRRIG 1000ML POUR BTL (IV SOLUTION) ×2 IMPLANT
PACK C SECTION WH (CUSTOM PROCEDURE TRAY) ×2 IMPLANT
PAD ABD 7.5X8 STRL (GAUZE/BANDAGES/DRESSINGS) IMPLANT
PAD OB MATERNITY 4.3X12.25 (PERSONAL CARE ITEMS) ×2 IMPLANT
PENCIL SMOKE EVAC W/HOLSTER (ELECTROSURGICAL) ×2 IMPLANT
RTRCTR C-SECT PINK 25CM LRG (MISCELLANEOUS) ×2 IMPLANT
STAPLER VISISTAT 35W (STAPLE) ×1 IMPLANT
SUT MON AB 2-0 CT1 27 (SUTURE) ×2 IMPLANT
SUT MON AB 4-0 PS1 27 (SUTURE) IMPLANT
SUT PDS AB 0 CTX 60 (SUTURE) IMPLANT
SUT PLAIN 2 0 XLH (SUTURE) ×1 IMPLANT
SUT VIC AB 0 CTX 36 (SUTURE) ×8
SUT VIC AB 0 CTX36XBRD ANBCTRL (SUTURE) ×4 IMPLANT
SUT VIC AB 4-0 KS 27 (SUTURE) IMPLANT
TOWEL OR 17X24 6PK STRL BLUE (TOWEL DISPOSABLE) ×2 IMPLANT
TRAY FOLEY W/BAG SLVR 14FR LF (SET/KITS/TRAYS/PACK) ×2 IMPLANT

## 2018-01-17 NOTE — Lactation Note (Signed)
This note was copied from a baby's chart. Lactation Consultation Note  Patient Name: Courtney [name] [id number] Date: 01/17/2018 Reason for consult: Initial assessment;Term;1st time breastfeeding  P2 mother whose infant is now 59 hours old.  Mother has a 29 year old child who was bottle fed so this is her first time breastfeeding.  She hopes to exclusively breastfeed.  Mother was trying to latch infant on when I entered.  Mother has large breasts with flat nipples.  Breast massage and hand expression done.  Attempted to latch infant onto the left breast in the football hold.  Infant has wide open mouth and strong suck but pulls off after a few sucks.  She has no nipple to grasp.  I provided a #20 NS and relatched baby.  She continued to suck with strong rhythmic bursts and small amount of colostrum noted in NS  Breast shells and manual pump provided with instructions for use.  Mother will begin using shells later today after sleep.  Encouraged her to feed 8-12 times/24 hours or earlier if infant shows feeding cues.  Continue STS, breast massage and hand expression.  Mother will call for assistance as needed.  FOB and family at bedside and supportive.  Infant doing STS with father at the end of her feeding.  Mom made aware of O/P services, breastfeeding support groups, community resources, and our phone # for post-discharge questions.  Maternal Data Formula Feeding for Exclusion: No Has patient been taught Hand Expression?: Yes Does the patient have breastfeeding experience prior to this delivery?: No  Feeding Feeding Type: Breast Fed Length of feed: 20 min  LATCH Score Latch: Grasps breast easily, tongue down, lips flanged, rhythmical sucking.  Audible Swallowing: None  Type of Nipple: Flat  Comfort (Breast/Nipple): Soft / non-tender  Hold (Positioning): Assistance needed to correctly position infant at breast and maintain latch.  LATCH Score: 6  Interventions Interventions: Breast  feeding basics reviewed;Assisted with latch;Skin to skin;Breast massage;Hand express;Position options;Support pillows;Adjust position;Breast compression;Shells;Hand pump  Lactation Tools Discussed/Used Tools: Shells;Pump;Nipple Shields Nipple shield size: 20 Shell Type: Inverted Breast pump type: Manual   Consult Status Consult Status: Follow-up Date: 01/18/18 Follow-up type: In-patient    Courtney [name] 01/17/2018, 11:50 PM

## 2018-01-17 NOTE — Anesthesia Preprocedure Evaluation (Signed)
Anesthesia Evaluation  Patient identified by MRN, date of birth, ID band Patient awake    Reviewed: Allergy & Precautions, NPO status , Patient's Chart, lab work & pertinent test results  History of Anesthesia Complications (+) history of anesthetic complications  Airway Mallampati: II   Neck ROM: Full    Dental  (+) Teeth Intact, Dental Advisory Given   Pulmonary neg pulmonary ROS,    breath sounds clear to auscultation       Cardiovascular negative cardio ROS   Rhythm:Regular     Neuro/Psych  Headaches, PSYCHIATRIC DISORDERS Depression    GI/Hepatic Neg liver ROS, GERD  ,  Endo/Other  negative endocrine ROS  Renal/GU negative Renal ROS     Musculoskeletal Scoliosis, back pain   Abdominal (+) + obese,  Abdomen: soft.    Peds  Hematology negative hematology ROS (+)   Anesthesia Other Findings   Reproductive/Obstetrics (+) Pregnancy 1st trimester missed AB                             Anesthesia Physical  Anesthesia Plan  ASA: II  Anesthesia Plan: Epidural   Post-op Pain Management:    Induction:   PONV Risk Score and Plan:   Airway Management Planned: Simple Face Mask  Additional Equipment:   Intra-op Plan:   Post-operative Plan:   Informed Consent: I have reviewed the patients History and Physical, chart, labs and discussed the procedure including the risks, benefits and alternatives for the proposed anesthesia with the patient or authorized representative who has indicated his/her understanding and acceptance.     Plan Discussed with:   Anesthesia Plan Comments:         Anesthesia Quick Evaluation

## 2018-01-17 NOTE — Anesthesia Procedure Notes (Addendum)
Procedure Name: Intubation Date/Time: 01/17/2018 2:33 PM Performed by: Nolon Nations, MD Pre-anesthesia Checklist: Patient identified, Emergency Drugs available, Suction available, Patient being monitored and Timeout performed Patient Re-evaluated:Patient Re-evaluated prior to induction Oxygen Delivery Method: Circle system utilized Preoxygenation: Pre-oxygenation with 100% oxygen Induction Type: IV induction, Rapid sequence and Cricoid Pressure applied Laryngoscope Size: Glidescope, 3 and Mac Grade View: Grade I Tube type: Oral Tube size: 7.0 mm Number of attempts: 1 Airway Equipment and Method: Stylet and Video-laryngoscopy Placement Confirmation: ETT inserted through vocal cords under direct vision,  positive ETCO2,  CO2 detector and breath sounds checked- equal and bilateral Secured at: 22 cm Tube secured with: Tape Dental Injury: Teeth and Oropharynx as per pre-operative assessment  Comments: Intubated per Dr. Lissa Hoard

## 2018-01-17 NOTE — Anesthesia Procedure Notes (Signed)
Epidural Patient location during procedure: OB Start time: 01/17/2018 1:35 PM End time: 01/17/2018 1:43 PM  Staffing Anesthesiologist: Lewie Loron, MD Performed: anesthesiologist   Preanesthetic Checklist Completed: patient identified, pre-op evaluation, timeout performed, IV checked, risks and benefits discussed and monitors and equipment checked  Epidural Patient position: sitting Prep: site prepped and draped and DuraPrep Patient monitoring: heart rate, continuous pulse ox and blood pressure Approach: midline Location: L3-L4 Injection technique: LOR air and LOR saline  Needle:  Needle type: Tuohy  Needle gauge: 17 G Needle length: 9 cm Needle insertion depth: 6 cm Catheter type: closed end flexible Catheter size: 19 Gauge Catheter at skin depth: 11 cm Test dose: negative  Assessment Sensory level: T8 Events: blood not aspirated, injection not painful, no injection resistance, negative IV test and no paresthesia  Additional Notes Reason for block:procedure for pain

## 2018-01-17 NOTE — MAU Note (Signed)
Contractions started at 0600, were 5-6 min, now 6-8 in.  No bleeding or leaking. No problems with preg.  Was 1+ on Monday.

## 2018-01-17 NOTE — Progress Notes (Addendum)
G3P1 @ 39.[redacted] wksga. Here dt ctx that started 06. Denies LOF or bleeding. EFM applied  VE 6-7/80/-3  1134: Provider notified. Report given. Orders received to admit with routine orders. Pt may have epidural if she chooses.  Birthing charge nurse notified by Press photographer. Room assigned to 165  1135: Pt to birthing via bed.

## 2018-01-17 NOTE — H&P (Signed)
27 y.o. [redacted]w[redacted]d  G3P1011 comes in c/o ctx.  Otherwise has good fetal movement and no bleeding.  Past Medical History:  Diagnosis Date  . Allergic rhinitis   . Back pain   . Common migraine with intractable migraine 12/16/2014   migraines PRN meds  . Depression    denies  . Eczema   . GERD (gastroesophageal reflux disease)    takes meds PRN   . Infection    UTI  . Scoliosis   . Vitamin D deficiency     Past Surgical History:  Procedure Laterality Date  . DILATION AND EVACUATION N/A 05/06/2015   Procedure: DILATATION AND EVACUATION;  Surgeon: Edwinna Areola, DO;  Location: [facility];  Service: Gynecology;  Laterality: N/A;    OB History  Gravida Para Term Preterm AB Living  SAB TAB Ectopic Multiple Live Births  1       1    # Outcome Date GA Lbr Len/2nd Weight Sex Delivery Anes PTL Lv  3 Current           2 Term      Vag-Spont   LIV  1 SAB             Social History   Socioeconomic History  . Marital status: Single    Spouse name: Not on file  . Number of children: 1  . Years of education: 34  . Highest education level: Not on file  Occupational History  . Occupation: [employer]  Social Needs  . Financial resource strain: Not on file  . Food insecurity:    Worry: Not on file    Inability: Not on file  . Transportation needs:    Medical: Not on file    Non-medical: Not on file  Tobacco Use  . Smoking status: Never Smoker  . Smokeless tobacco: Never Used  Substance and Sexual Activity  . Alcohol use: No    Comment: occasionally  . Drug use: No  . Sexual activity: Yes    Birth control/protection: None  Lifestyle  . Physical activity:    Days per week: Not on file    Minutes per session: Not on file  . Stress: Not on file  Relationships  . Social connections:    Talks on phone: Not on file    Gets together: Not on file    Attends religious service: Not on file    Active member of club or organization: Not on file    Attends  meetings of clubs or organizations: Not on file    Relationship status: Not on file  . Intimate partner violence:    Fear of current or ex partner: Not on file    Emotionally abused: Not on file    Physically abused: Not on file    Forced sexual activity: Not on file  Other Topics Concern  . Not on file  Social History Narrative   Patient is right handed.   Patient drinks 2 cups of caffeine daily.   No known allergies    Prenatal Transfer Tool  Maternal Diabetes: No Genetic Screening: Normal Maternal Ultrasounds/Referrals: Normal Fetal Ultrasounds or other Referrals:  None Maternal Substance Abuse:  No Significant Maternal Medications:  None Significant Maternal Lab Results: Lab values include: Group B Strep negative  Other PNC: Chlamydia positive and treated in pregnancy, retest negative    Vitals:   01/17/18 1101 01/17/18 1107 01/17/18 1153  BP:  123/76  134/72  Pulse:  99 91  Resp:  18 18  Temp:  98 F (36.7 C) 98.3 F (36.8 C)  TempSrc:  Oral Oral  Weight: 201 lb 4 oz (91.3 kg)  201 lb 4 oz (91.3 kg)  Height:    (1.575 m)    Lungs/Cor:  NAD Abdomen:  soft, gravid Ex:  no cords, erythema SVE:  6.5/80/-3 per MAU staff FHTs:  150, good STV, NST R Toco:  q2-5   A/P   Admitted in labor  GBS Neg  Routine care, anticipate AROM  Rajat Staver

## 2018-01-17 NOTE — Transfer of Care (Signed)
Immediate Anesthesia Transfer of Care Note  Patient: Courtney [name]  Procedure(s) Performed: CESAREAN SECTION (N/A Abdomen)  Patient Location: PACU  Anesthesia Type:General  Level of Consciousness: awake, alert  and oriented  Airway & Oxygen Therapy: Patient Spontanous Breathing and Patient connected to nasal cannula oxygen  Post-op Assessment: Report given to RN and Post -op Vital signs reviewed and stable  Post vital signs: Reviewed and stable HR 90, RR 18, BP 107/51, SaO2 100%  Last Vitals:  Vitals Value Taken Time  BP 107/51 01/17/2018  3:30 PM  Temp 36.4 C 01/17/2018  3:25 PM  Pulse 97 01/17/2018  3:34 PM  Resp 21 01/17/2018  3:34 PM  SpO2 100 % 01/17/2018  3:34 PM  Vitals shown include unvalidated device data.  Last Pain:  Vitals:   01/17/18 1525  TempSrc: Oral  PainSc: 0-No pain      Patients Stated Pain Goal: 9 (01/17/18 1154)  Complications: No apparent anesthesia complications

## 2018-01-17 NOTE — Brief Op Note (Signed)
01/17/2018  3:17 PM  PATIENT:  Courtney [name]  27 y.o. female  PRE-OPERATIVE DIAGNOSIS:  nonreassuring heart tones  POST-OPERATIVE DIAGNOSIS:  nonreassuring heart tones  PROCEDURE:  Procedure(s): CESAREAN SECTION (N/A)  SURGEON:  Surgeon(s) and Role:    Philip Aspen, DO - Primary    * Anyanwu, Jethro Bastos, MD - Assisting   ANESTHESIA:   general  EBL:  777 mL   SPECIMEN:  Source of Specimen:  placenta and cord for pH   FINDINGS: female infant in cephalic presentation, vigorous and response at delivery.  Normal appearance of uterus, normal tubes and ovaries palpated bilaterally  DISPOSITION OF SPECIMEN:  PATHOLOGY  COUNTS:  YES  PLAN OF CARE: Admit to inpatient   PATIENT DISPOSITION:  PACU - hemodynamically stable.   Delay start of Pharmacological VTE agent (>24hrs) due to surgical blood loss or risk of bleeding: not applicable

## 2018-01-17 NOTE — Anesthesia Procedure Notes (Addendum)
Date/Time: 01/17/2018 2:33 PM Performed by: Lewie Loron, MD Pre-anesthesia Checklist: Patient identified, Emergency Drugs available, Suction available, Patient being monitored and Timeout performed Patient Re-evaluated:Patient Re-evaluated prior to induction Oxygen Delivery Method: Circle system utilized Preoxygenation: Pre-oxygenation with 100% oxygen Induction Type: IV induction, Rapid sequence and Cricoid Pressure applied Laryngoscope Size: Glidescope and 3 Grade View: Grade I Tube type: Oral Tube size: 7.0 mm Number of attempts: 1 Airway Equipment and Method: Video-laryngoscopy Placement Confirmation: ETT inserted through vocal cords under direct vision,  positive ETCO2 and breath sounds checked- equal and bilateral Secured at: 21 cm Tube secured with: Tape Dental Injury: Teeth and Oropharynx as per pre-operative assessment

## 2018-01-18 LAB — CBC
HCT: 25.6 % — ABNORMAL LOW (ref 36.0–46.0)
HEMOGLOBIN: 8.5 g/dL — AB (ref 12.0–15.0)
MCH: 31.6 pg (ref 26.0–34.0)
MCHC: 33.2 g/dL (ref 30.0–36.0)
MCV: 95.2 fL (ref 78.0–100.0)
Platelets: 164 10*3/uL (ref 150–400)
RBC: 2.69 MIL/uL — ABNORMAL LOW (ref 3.87–5.11)
RDW: 13.9 % (ref 11.5–15.5)
WBC: 16.5 10*3/uL — ABNORMAL HIGH (ref 4.0–10.5)

## 2018-01-18 LAB — RPR: RPR Ser Ql: NONREACTIVE

## 2018-01-18 MED ORDER — MEASLES, MUMPS & RUBELLA VAC ~~LOC~~ INJ
0.5000 mL | INJECTION | Freq: Once | SUBCUTANEOUS | Status: AC
  Administered 2018-01-19: 0.5 mL via SUBCUTANEOUS
  Filled 2018-01-18 (×2): qty 0.5

## 2018-01-18 NOTE — Progress Notes (Signed)
MOB was referred for history of depression/anxiety. * Referral screened out by Clinical Social Worker because none of the following criteria appear to apply: ~ History of anxiety/depression during this pregnancy, or of post-partum depression. Per MOB's H&P MOB denies depression. Also there are no concerns noted in OB record.  ~ Diagnosis of anxiety and/or depression within last 3 years OR * MOB's symptoms currently being treated with medication and/or therapy. Please contact the Clinical Social Worker if needs arise, by MOB request, or if MOB scores greater than 9/yes to question 10 on [place] Postpartum Depression Screen.  Taiesha Bovard Boyd-Gilyard, MSW, LCSW Clinical Social Work [phone]   

## 2018-01-18 NOTE — Progress Notes (Signed)
Patient is doing well.  She is tolerating PO, ambulating, voiding.  Pain is controlled.  Lochia is appropriate  Vitals:   01/17/18 2211 01/17/18 2354 01/18/18 0200 01/18/18 0553  BP:   (!) (P) 102/58 (!) 90/45  Pulse:   (P) 77 66  Resp:   (P) 18 18  Temp:   (P) 98.3 F (36.8 C) 98.9 F (37.2 C)  TempSrc:   (P) Oral Oral  SpO2: 97% 95% (P) 96% 95%  Weight:      Height:        NAD Abdomen:  soft, appropriate tenderness, incisions intact and without erythema or drainage ext:    Symmetric, trace edema bilaterally  Lab Results  Component Value Date   WBC 16.5 (H) 01/18/2018   HGB 8.5 (L) 01/18/2018   HCT 25.6 (L) 01/18/2018   MCV 95.2 01/18/2018   PLT 164 01/18/2018    --/--/A POS (05/08 1152)/RNI  A/P    27 y.o. G3P1011 POD 1 s/p urgent C/S for fetal bradycardia Routine post op and postpartum care.   Rubella Non-immune--MMR prior to d/c Anticipate discharge tomorrow Staple removal prior to d/c

## 2018-01-18 NOTE — Op Note (Signed)
NAME: Courtney [name], [place] MEDICAL RECORD VH:[id number] ACCOUNT [id number] DATE OF BIRTH:1990-11-17 FACILITY: [facility] LOCATION: [id number] PHYSICIAN:Raynesha Tiedt M. Claiborne Billings, DO  OPERATIVE REPORT  DATE OF PROCEDURE:  01/17/2018  PREOPERATIVE DIAGNOSIS:  Nonreassuring fetal heart tones.  POSTOPERATIVE DIAGNOSIS:  Nonreassuring fetal heart tones.  PROCEDURE:  Low transverse cesarean section.  SURGEON:  Philip Aspen, DO  ASSISTANT:  Jaynie Collins, MD  ANESTHESIA:  General.  ESTIMATED BLOOD LOSS:   777 mL.  SPECIMENS:  Placenta and cord blood for pH.  FINDINGS:  Female infant in cephalic presentation, vigorous and responsive at delivery.  Normal appearance of the uterus.  Normal tubes and ovaries palpated bilaterally.  Apgars 9 and 9, weight 6 pounds 15 ounces.     COMPLICATIONS:  None.    CONDITION:  Stable to PACU.  DESCRIPTION OF PROCEDURE:  While patient was on labor and delivery, she was found to be progressing quickly.  I was called to the room at the patient's request to break her water.  On exam, head felt engaged, but not low in the pelvis as the nurse had  noted on her exam:  The head felt applied, therefore, artificial rupture of membranes was performed and meconium fluid was noted.  Immediately after rupture of membranes fetal heart tones decreased to the 50s.  I surveyed the area and did not feel any  cord prolapsing but felt that perhaps cord was being impinged beyond my ability to reach.  We moved the patient to the side and heart remained in the 50s.  At this time, a stat section was called and a faculty physician was requested to be in attendance  for assistance.  We moved quickly to the operating room where epidural anesthesia was found to be inadequate; therefore, general anesthesia was administered after sterile Betadine prep and drape was performed.  Immediately at patient's general anesthesia  induction, skin incision was made with the scalpel and carried down to  underlying layer of fascia with the scalpel.  The fascia was incised and all subcutaneous tissue and fascia was extended by traction laterally, superiorly and caudally.  The rectus  muscles were bluntly dissected.  Peritoneum was identified and entered bluntly and this was extended bilateral traction.  Bladder blade was placed and with good visualization, a low transverse cesarean incision was made avoiding bladder.  The uterine  incision was extended by cephalic and caudal traction.  Infant's head was immediately delivered, followed by the remainder of the infant's body.  Good tone was noted immediately upon delivery.  Cord was clamped and cut and infant was handed off to  awaiting neonatology where vigorous activity and cries were noted.  A section of cord was collected to obtain cord pH sample.  External massage of the uterus was performed.  Pitocin was started and placenta was removed by gentle traction and external  uterine massage.  The uterus was cleared of all clot and debris and the uterine incision was reapproximated and closed it with Vicryl in a running locked fashion followed by a second layer of vertical imbrication.  Excellent hemostasis was noted.   Ovaries and tubes were palpated normal.  After delivery of baby, an Alexis retractor was placed to better visualize the incision prior to closure.  After hemostasis was assured,  the Alexis self retractor was then removed.  All layers examined and found  to be hemostatic.  Peritoneum was then reapproximated and closed with Monocryl in a running fashion.  One interrupted suture to reapproximate rectus muscles was placed  with Vicryl.  The fascia was reapproximated with 0 Vicryl and the subcutaneous tissue  was irrigated and dried.  Minimal use of Bovie cautery was needed.  Five interrupted sutures were placed in the subcutaneous tissue for reapproximation.  Skin was then reapproximated and closed with staples.  Sponge, lap and needle counts were  correct  x2.    The patient tolerated the procedure well.  AN/NUANCE  D:01/17/2018 T:01/18/2018 JOB:[id number]

## 2018-01-18 NOTE — Anesthesia Postprocedure Evaluation (Signed)
Anesthesia Post Note  Patient: Courtney [name]  Procedure(s) Performed: CESAREAN SECTION (N/A Abdomen)     Patient location during evaluation: Mother Baby Anesthesia Type: Epidural and General Level of consciousness: awake and alert Pain management: pain level controlled Vital Signs Assessment: post-procedure vital signs reviewed and stable Respiratory status: spontaneous breathing, nonlabored ventilation and respiratory function stable Cardiovascular status: stable Postop Assessment: no headache, no backache, epidural receding, no apparent nausea or vomiting, able to ambulate, patient able to bend at knees and adequate PO intake Anesthetic complications: no    Last Vitals:  Vitals:   01/18/18 0200 01/18/18 0553  BP: (!) (P) 102/58 (!) 90/45  Pulse: (P) 77 66  Resp: (P) 18 18  Temp: (P) 36.8 C 37.2 C  SpO2: (P) 96% 95%    Last Pain:  Vitals:   01/18/18 0553  TempSrc: Oral  PainSc:    Pain Goal: Patients Stated Pain Goal: 9 (01/17/18 1154)               [name]

## 2018-01-19 MED ORDER — FERROUS GLUCONATE 324 (38 FE) MG PO TABS: 324.0000 mg | ORAL_TABLET | Freq: Every day | ORAL | 3 refills | Status: DC

## 2018-01-19 MED ORDER — OXYCODONE-ACETAMINOPHEN 5-325 MG PO TABS: 2.0000 | ORAL_TABLET | ORAL | 0 refills | Status: DC | PRN

## 2018-01-19 MED ORDER — IBUPROFEN 600 MG PO TABS: 600.0000 mg | ORAL_TABLET | Freq: Four times a day (QID) | ORAL | 0 refills | Status: DC

## 2018-01-19 NOTE — Progress Notes (Signed)
CSW received and acknowledges consult for EDPS of 9.  Consult screened out due to 9 on EDPS does not warrant a CSW consult.  MOB whom scores are greater than 9/yes to question 10 on [place] Postpartum Depression Screen warrants a CSW consult.    Please contact CSW if other needs arise or if MOB request.   Blaine Hamper, MSW, LCSW Clinical Social Work [phone]

## 2018-01-19 NOTE — Progress Notes (Signed)
Patient is non-immune to [place]. Information given to patient on the MMR vaccine.

## 2018-01-19 NOTE — Discharge Summary (Signed)
Obstetric Discharge Summary Reason for Admission: onset of labor Prenatal Procedures: ultrasound Intrapartum Procedures: cesarean: low cervical, transverse Postpartum Procedures: none Complications-Operative and Postpartum: none Hemoglobin  Date Value Ref Range Status  01/18/2018 8.5 (L) 12.0 - 15.0 g/dL Final    Comment:    DELTA CHECK NOTED REPEATED TO VERIFY    HCT  Date Value Ref Range Status  01/18/2018 25.6 (L) 36.0 - 46.0 % Final    Physical Exam:  General: alert and cooperative Lochia: appropriate Uterine Fundus: firm Incision: healing well DVT Evaluation: No evidence of DVT seen on physical exam.  Discharge Diagnoses: Term Pregnancy-delivered and non reassuring FHTs  Discharge Information: Date: 01/19/2018 Activity: pelvic rest Diet: routine Medications: Ibuprofen, Iron and Percocet Condition: stable Instructions: refer to practice specific booklet Discharge to: home Follow-up Information    Philip Aspen, DO. Schedule an appointment as soon as possible for a visit in 1 month(s).   Specialty:  Obstetrics and Gynecology Contact information: [address] [place] [address] [phone]           Newborn Data: Live born female  Birth Weight: 6 lb 15.6 oz (3164 g) APGAR: 9, 9  Newborn Delivery   Birth date/time:  01/17/2018 14:34:00 Delivery type:  C-Section, Low Transverse Trial of labor:  No C-section categorization:  Primary     Home with mother.  Sparkle Aube E 01/19/2018, 8:49 AM

## 2018-01-19 NOTE — Progress Notes (Signed)
POD#2 Pt without complaints. Lochia mild VSSAF B/Ps on low side but pt asymptomatic , no tachycardia ABD-  Non tender IMP/ Stable Plan/Discharge

## 2018-04-13 ENCOUNTER — Encounter (HOSPITAL_COMMUNITY): Payer: Self-pay

## 2018-07-22 ENCOUNTER — Inpatient Hospital Stay (HOSPITAL_COMMUNITY)
Admission: AD | Admit: 2018-07-22 | Discharge: 2018-07-22 | Disposition: A | Discharge status: Home / Self Care | Payer: Medicaid Other | Source: Ambulatory Visit | Attending: Obstetrics & Gynecology | Admitting: Obstetrics & Gynecology

## 2018-07-22 ENCOUNTER — Inpatient Hospital Stay (HOSPITAL_COMMUNITY): Payer: Medicaid Other

## 2018-07-22 ENCOUNTER — Other Ambulatory Visit: Payer: Self-pay

## 2018-07-22 ENCOUNTER — Encounter (HOSPITAL_COMMUNITY): Payer: Self-pay

## 2018-07-22 DIAGNOSIS — R109 Unspecified abdominal pain: Secondary | ICD-10-CM

## 2018-07-22 DIAGNOSIS — O209 Hemorrhage in early pregnancy, unspecified: Secondary | ICD-10-CM

## 2018-07-22 DIAGNOSIS — Z3491 Encounter for supervision of normal pregnancy, unspecified, first trimester: Secondary | ICD-10-CM

## 2018-07-22 DIAGNOSIS — Z8742 Personal history of other diseases of the female genital tract: Secondary | ICD-10-CM

## 2018-07-22 DIAGNOSIS — R1010 Upper abdominal pain, unspecified: Secondary | ICD-10-CM | POA: Diagnosis present

## 2018-07-22 DIAGNOSIS — O26891 Other specified pregnancy related conditions, first trimester: Secondary | ICD-10-CM

## 2018-07-22 DIAGNOSIS — O208 Other hemorrhage in early pregnancy: Secondary | ICD-10-CM | POA: Diagnosis not present

## 2018-07-22 DIAGNOSIS — Z3A01 Less than 8 weeks gestation of pregnancy: Secondary | ICD-10-CM

## 2018-07-22 LAB — CBC
HEMATOCRIT: 35.1 % — AB (ref 36.0–46.0)
HEMOGLOBIN: 11.7 g/dL — AB (ref 12.0–15.0)
MCH: 30.3 pg (ref 26.0–34.0)
MCHC: 33.3 g/dL (ref 30.0–36.0)
MCV: 90.9 fL (ref 80.0–100.0)
NRBC: 0 % (ref 0.0–0.2)
Platelets: 211 10*3/uL (ref 150–400)
RBC: 3.86 MIL/uL — ABNORMAL LOW (ref 3.87–5.11)
RDW: 12.8 % (ref 11.5–15.5)
WBC: 7.7 10*3/uL (ref 4.0–10.5)

## 2018-07-22 LAB — URINALYSIS, ROUTINE W REFLEX MICROSCOPIC
Bacteria, UA: NONE SEEN
Bilirubin Urine: NEGATIVE
GLUCOSE, UA: NEGATIVE mg/dL
Ketones, ur: NEGATIVE mg/dL
Nitrite: NEGATIVE
PROTEIN: NEGATIVE mg/dL
Specific Gravity, Urine: 1.026 (ref 1.005–1.030)
pH: 6 (ref 5.0–8.0)

## 2018-07-22 LAB — POCT PREGNANCY, URINE: Preg Test, Ur: POSITIVE — AB

## 2018-07-22 LAB — WET PREP, GENITAL
Clue Cells Wet Prep HPF POC: NONE SEEN
SPERM: NONE SEEN
TRICH WET PREP: NONE SEEN
YEAST WET PREP: NONE SEEN

## 2018-07-22 LAB — HCG, QUANTITATIVE, PREGNANCY: HCG, BETA CHAIN, QUANT, S: 28916 m[IU]/mL — AB (ref <5)

## 2018-07-22 MED ORDER — CONCEPT OB 130-92.4-1 MG PO CAPS: 1.0000 | ORAL_CAPSULE | Freq: Every day | ORAL | 12 refills | Status: DC

## 2018-07-22 NOTE — MAU Provider Note (Signed)
Chief Complaint: Possible Pregnancy   First Provider Initiated Contact with Patient 07/22/18 1018     SUBJECTIVE HPI: Courtney [name] is a 27 y.o. [id number] at [redacted]w[redacted]d who presents to Maternity Admissions reporting spotting x 2 days and upper abd pain x 4 days. Pos home UPT x 2. Patient's last menstrual period was 06/03/2018 (exact date). Irreg cycles  Vaginal Bleeding: spotting Passage of tissue or clots: Denies Dizziness: Denies  A POS  Pain Location: epigastic Quality: sharp Severity: mild Duration: 4 days Course: unchanged Context: early pregnancy Timing: intermittent Modifying factors: none Hasn't tried anything Associated signs and symptoms: Neg for fever, chills. Mild Nausea. Vomited once.   Past Medical History:  Diagnosis Date  . Allergic rhinitis   . Back pain   . Common migraine with intractable migraine 12/16/2014   migraines PRN meds  . Depression    denies  . Eczema   . GERD (gastroesophageal reflux disease)    takes meds PRN   . Infection    UTI  . Scoliosis   . Vitamin D deficiency    OB History  Gravida Para Term Preterm AB Living  4 2 2   1 2   SAB TAB Ectopic Multiple Live Births  1       1    # Outcome Date GA Lbr Len/2nd Weight Sex Delivery Anes PTL Lv  4 Current           3 Term 01/17/18 [redacted]w[redacted]d    CS-Unspec     2 SAB 2016          1 Term      Vag-Spont   LIV   Past Surgical History:  Procedure Laterality Date  . CESAREAN SECTION N/A 01/17/2018   Procedure: CESAREAN SECTION;  Surgeon: Philip Aspen, DO;  [facility]: [facility] BIRTHING SUIT[facility];  Service: Obstetrics;  Laterality: N/A;  . DILATION AND EVACUATION N/A 05/06/2015   Procedure: DILATATION AND EVACUATION;  Surgeon: Edwinna Areola, DO;  Location: [facility] ORS;  Service: Gynecology;  Laterality: N/A;   Social History   Socioeconomic History  . Marital status: Single    Spouse name: Not on file  . Number of children: 1  . Years of education: 64  . Highest education level: Not on file   Occupational History  . Occupation: [employer]  Social Needs  . Financial resource strain: Not on file  . Food insecurity:    Worry: Not on file    Inability: Not on file  . Transportation needs:    Medical: Not on file    Non-medical: Not on file  Tobacco Use  . Smoking status: Never Smoker  . Smokeless tobacco: Never Used  Substance and Sexual Activity  . Alcohol use: No    Comment: occasionally  . Drug use: No  . Sexual activity: Yes    Birth control/protection: None  Lifestyle  . Physical activity:    Days per week: Not on file    Minutes per session: Not on file  . Stress: Not on file  Relationships  . Social connections:    Talks on phone: Not on file    Gets together: Not on file    Attends religious service: Not on file    Active member of club or organization: Not on file    Attends meetings of clubs or organizations: Not on file    Relationship status: Not on file  . Intimate partner violence:    Fear of current or ex partner:  Not on file    Emotionally abused: Not on file    Physically abused: Not on file    Forced sexual activity: Not on file  Other Topics Concern  . Not on file  Social History Narrative   Patient is right handed.   Patient drinks 2 cups of caffeine daily.   No current facility-administered medications on file prior to encounter.    Current Outpatient Medications on File Prior to Encounter  Medication Sig Dispense Refill  . ferrous gluconate (FERGON) 324 MG tablet Take 1 tablet (324 mg total) by mouth daily with breakfast. 30 tablet 3  . ibuprofen (ADVIL,MOTRIN) 600 MG tablet Take 1 tablet (600 mg total) by mouth every 6 (six) hours. 30 tablet 0  . oxyCODONE-acetaminophen (PERCOCET/ROXICET) 5-325 MG tablet Take 2 tablets by mouth every 4 (four) hours as needed (pain scale > 7). 30 tablet 0   Allergies  Allergen Reactions  . No Known Allergies     I have reviewed the past Medical Hx, Surgical Hx, Social Hx,  Allergies and Medications.   Review of Systems  Constitutional: Negative for appetite change, chills and fever.  Gastrointestinal: Positive for abdominal pain, nausea and vomiting. Negative for abdominal distention, constipation and diarrhea.  Genitourinary: Positive for vaginal bleeding and vaginal discharge.    OBJECTIVE Patient Vitals for the past 24 hrs:  BP Temp Temp src Pulse Resp SpO2 Height Weight  07/22/18 0956 119/76 98.3 F (36.8 C) Oral 85 18 99 % - -  07/22/18 0947 - - - - - - 5\' 2"  (1.575 m) 83.5 kg   Constitutional: Well-developed, well-nourished female in no acute distress.  Cardiovascular: normal rate Respiratory: normal rate and effort.  GI: Abd soft, non-tender. Pos BS x 4 MS: Extremities nontender, no edema, normal ROM Neurologic: Alert and oriented x 4.  GU:  SPECULUM EXAM: NEFG, pink physiologic discharge, no active bleeding noted, cervix w/ normal extropion  BIMANUAL: cervix closed; uterus slightly enlarged size, no adnexal tenderness or masses. No CMT.  LAB RESULTS Results for orders placed or performed during the hospital encounter of 07/22/18 (from the past 24 hour(s))  Urinalysis, Routine w reflex microscopic     Status: Abnormal   Collection Time: 07/22/18 10:02 AM  Result Value Ref Range   Color, Urine YELLOW YELLOW   APPearance HAZY (A) CLEAR   Specific Gravity, Urine 1.026 1.005 - 1.030   pH 6.0 5.0 - 8.0   Glucose, UA NEGATIVE NEGATIVE mg/dL   Hgb urine dipstick SMALL (A) NEGATIVE   Bilirubin Urine NEGATIVE NEGATIVE   Ketones, ur NEGATIVE NEGATIVE mg/dL   Protein, ur NEGATIVE NEGATIVE mg/dL   Nitrite NEGATIVE NEGATIVE   Leukocytes, UA MODERATE (A) NEGATIVE   RBC / HPF 0-5 0 - 5 RBC/hpf   WBC, UA 11-20 0 - 5 WBC/hpf   Bacteria, UA NONE SEEN NONE SEEN   Squamous Epithelial / LPF 11-20 0 - 5   Mucus PRESENT   Pregnancy, urine POC     Status: Abnormal   Collection Time: 07/22/18 10:08 AM  Result Value Ref Range   Preg Test, Ur POSITIVE (A)  NEGATIVE  Wet prep, genital     Status: Abnormal   Collection Time: 07/22/18 10:15 AM  Result Value Ref Range   Yeast Wet Prep HPF POC NONE SEEN NONE SEEN   Trich, Wet Prep NONE SEEN NONE SEEN   Clue Cells Wet Prep HPF POC NONE SEEN NONE SEEN   WBC, Wet Prep HPF POC MANY (A)  NONE SEEN   Sperm NONE SEEN   hCG, quantitative, pregnancy     Status: Abnormal   Collection Time: 07/22/18 10:36 AM  Result Value Ref Range   hCG, Beta Chain, Quant, S 28,916 (H) <5 mIU/mL  CBC     Status: Abnormal   Collection Time: 07/22/18 10:36 AM  Result Value Ref Range   WBC 7.7 4.0 - 10.5 K/uL   RBC 3.86 (L) 3.87 - 5.11 MIL/uL   Hemoglobin 11.7 (L) 12.0 - 15.0 g/dL   HCT 16.1 (L) 09.6 - 04.5 %   MCV 90.9 80.0 - 100.0 fL   MCH 30.3 26.0 - 34.0 pg   MCHC 33.3 30.0 - 36.0 g/dL   RDW 40.9 81.1 - 91.4 %   Platelets 211 150 - 400 K/uL   nRBC 0.0 0.0 - 0.2 %    IMAGING US Ob Comp Less 14 Wks  Result Date: 07/22/2018 CLINICAL DATA:  Pregnant patient with vaginal bleeding. Abdominal pain. EXAM: OBSTETRIC <14 WK [place] AND TRANSVAGINAL OB US TECHNIQUE: Both transabdominal and transvaginal ultrasound examinations were performed for complete evaluation of the gestation as well as the maternal uterus, adnexal regions, and pelvic cul-de-sac. Transvaginal technique was performed to assess early pregnancy. COMPARISON:  Pelvic ultrasound 06/06/2017 FINDINGS: Intrauterine gestational sac: Single Yolk sac:  Visualized. Embryo:  Visualized. Cardiac Activity: Visualized. Heart Rate: 128 bpm CRL:  6.3 mm   6 w   3 d                  [place] EDC: 03/14/2019 Subchorionic hemorrhage:  Small Maternal uterus/adnexae: Right ovarian corpus luteum. Normal left ovary. No free fluid in the pelvis. IMPRESSION: Single live intrauterine gestation.  Small subchorionic hemorrhage. Electronically Signed   By: Annia Belt M.D.   On: 07/22/2018 12:09   US Ob Transvaginal  Result Date: 07/22/2018 CLINICAL DATA:  Pregnant patient with vaginal  bleeding. Abdominal pain. EXAM: OBSTETRIC <14 WK [place] AND TRANSVAGINAL OB US TECHNIQUE: Both transabdominal and transvaginal ultrasound examinations were performed for complete evaluation of the gestation as well as the maternal uterus, adnexal regions, and pelvic cul-de-sac. Transvaginal technique was performed to assess early pregnancy. COMPARISON:  Pelvic ultrasound 06/06/2017 FINDINGS: Intrauterine gestational sac: Single Yolk sac:  Visualized. Embryo:  Visualized. Cardiac Activity: Visualized. Heart Rate: 128 bpm CRL:  6.3 mm   6 w   3 d                  [place] EDC: 03/14/2019 Subchorionic hemorrhage:  Small Maternal uterus/adnexae: Right ovarian corpus luteum. Normal left ovary. No free fluid in the pelvis. IMPRESSION: Single live intrauterine gestation.  Small subchorionic hemorrhage. Electronically Signed   By: Annia Belt M.D.   On: 07/22/2018 12:09    MAU COURSE CBC, Quant, ABO/Rh, ultrasound, wet prep and GC/chlamydia culture, UA  MDM Pain and bleeding in early pregnancy with normal intrauterine pregnancy and hemodynamically stable.  ASSESSMENT 1. Normal IUP (intrauterine pregnancy) on prenatal ultrasound, first trimester   2. Abdominal pain during pregnancy in first trimester   3. Vaginal bleeding affecting early pregnancy   4. History of irregular menstrual cycles     PLAN Discharge home in stable condition. First trimester precautions Urine culture, GC/Chlmaydia cultures pending Follow-up Information    Obstetrician of your choice Follow up.   Why:  Start prenatal care       [facility] Follow up.   Why:  As needed in pregnancy emergencies Contact information: [address]  [address] [id number] mc [place] [address] [phone]         Allergies as of 07/22/2018      Reactions   No Known Allergies       Medication List    STOP taking these medications   ibuprofen 600 MG tablet Commonly known as:  ADVIL,MOTRIN    oxyCODONE-acetaminophen 5-325 MG tablet Commonly known as:  PERCOCET/ROXICET     TAKE these medications   CONCEPT OB 130-92.4-1 MG Caps Take 1 tablet by mouth daily.   ferrous gluconate 324 MG tablet Commonly known as:  FERGON Take 1 tablet (324 mg total) by mouth daily with breakfast.        Katrinka Blazing, [place], CNM 07/22/2018  1:23 PM  4

## 2018-07-22 NOTE — Discharge Instructions (Signed)
[facility] [employer] [facility] [employer] at [facility]       Phone: [phone]  Center for [employer] at [employer] Phone: [phone]  [facility] for [employer] at [place]  Phone: [phone]  [facility] for [employer] at [employer]  Phone: [phone]  Center for [facility] [place]  Phone: [phone]  [facility]       Phone: [phone]  [facility]    Phone: [phone]   [facility] [place]    Phone: [phone]  [facility]    Phone: [phone]  [facility]    Phone: [phone]  [facility]    Phone: [phone]  [facility]       Phone: [phone]   [facility]  Phone: [phone]  [facility]    Phone: [phone]  [facility]   Phone: [phone]  [facility]      Phone: [phone]  [facility]    Phone: [phone]    How a Baby Grows During Pregnancy Pregnancy begins when a female's sperm enters a female's egg (fertilization). This happens in one of the tubes (fallopian tubes) that connect the ovaries to the womb (uterus). The fertilized egg is called an embryo until it reaches 10 weeks. From 10 weeks until birth, it is called a fetus. The fertilized egg moves down the fallopian tube to the uterus. Then it implants into the lining of the uterus and begins to grow. The developing fetus receives oxygen and nutrients through the pregnant woman's bloodstream and the tissues that grow (placenta) to support the fetus. The placenta is the life support system for the fetus. It provides nutrition and removes waste. Learning as much as you can about your pregnancy and how your baby is developing can help you enjoy the experience.  It can also make you aware of when there might be a problem and when to ask questions. How long does a typical pregnancy last? A pregnancy usually lasts 280 days, or about 40 weeks. Pregnancy is divided into three trimesters:  First trimester: 0-13 weeks.  Second trimester: 14-27 weeks.  Third trimester: 28-40 weeks.  The day when your baby is considered ready to be born (full term) is your estimated date of delivery. How does my baby develop month by month? First month  The fertilized egg attaches to the inside of the uterus.  Some cells will form the placenta. Others will form the fetus.  The arms, legs, brain, spinal cord, lungs, and heart begin to develop.  At the end of the first month, the heart begins to beat.  Second month  The bones, inner ear, eyelids, hands, and feet form.  The genitals develop.  By the end of 8 weeks, all major organs are developing.  Third month  All of the internal organs are forming.  Teeth develop below the gums.  Bones and muscles begin to grow. The spine can flex.  The skin is transparent.  Fingernails and toenails begin to form.  Arms and legs continue to grow longer, and hands and feet develop.  The fetus is about 3 in (7.6 cm) long.  Fourth month  The placenta is completely formed.  The external sex organs, neck, outer ear, eyebrows, eyelids, and fingernails are formed.  The fetus can hear, swallow, and move its arms and legs.  The kidneys begin to  produce urine.  The skin is covered with a white waxy coating (vernix) and very fine hair (lanugo).  Fifth month  The fetus moves around more and can be felt for the first time (quickening).  The fetus starts to sleep and wake up and may begin to suck its finger.  The nails grow to the end of the fingers.  The organ in the digestive system that makes bile (gallbladder) functions and helps to digest the nutrients.  If your baby is a girl, eggs are present in her  ovaries. If your baby is a boy, testicles start to move down into his scrotum.  Sixth month  The lungs are formed, but the fetus is not yet able to breathe.  The eyes open. The brain continues to develop.  Your baby has fingerprints and toe prints. Your baby's hair grows thicker.  At the end of the second trimester, the fetus is about 9 in (22.9 cm) long.  Seventh month  The fetus kicks and stretches.  The eyes are developed enough to sense changes in light.  The hands can make a grasping motion.  The fetus responds to sound.  Eighth month  All organs and body systems are fully developed and functioning.  Bones harden and taste buds develop. The fetus may hiccup.  Certain areas of the brain are still developing. The skull remains soft.  Ninth month  The fetus gains about  lb (0.23 kg) each week.  The lungs are fully developed.  Patterns of sleep develop.  The fetus's head typically moves into a head-down position (vertex) in the uterus to prepare for birth. If the buttocks move into a vertex position instead, the baby is breech.  The fetus weighs 6-9 lbs (2.72-4.08 kg) and is 19-20 in (48.26-50.8 cm) long.  What can I do to have a healthy pregnancy and help my baby develop? Eating and Drinking  Eat a healthy diet. ? Talk with your health care provider to make sure that you are getting the nutrients that you and your baby need. ? Visit www.DisposableNylon.be to learn about creating a healthy diet.  Gain a healthy amount of weight during pregnancy as advised by your health care provider. This is usually 25-35 pounds. You may need to: ? Gain more if you were underweight before getting pregnant or if you are pregnant with more than one baby. ? Gain less if you were overweight or obese when you got pregnant.  Medicines and Vitamins  Take prenatal vitamins as directed by your health care provider. These include vitamins such as folic acid, iron, calcium, and vitamin  D. They are important for healthy development.  Take medicines only as directed by your health care provider. Read labels and ask a pharmacist or your health care provider whether over-the-counter medicines, supplements, and prescription drugs are safe to take during pregnancy.  Activities  Be physically active as advised by your health care provider. Ask your health care provider to recommend activities that are safe for you to do, such as walking or swimming.  Do not participate in strenuous or extreme sports.  Lifestyle  Do not drink alcohol.  Do not use any tobacco products, including cigarettes, chewing tobacco, or electronic cigarettes. If you need help quitting, ask your health care provider.  Do not use illegal drugs.  Safety  Avoid exposure to mercury, lead, or other heavy metals. Ask your health care provider about common sources of these heavy metals.  Avoid listeria infection during pregnancy. Follow these  precautions: ? Do not eat soft cheeses or deli meats. ? Do not eat hot dogs unless they have been warmed up to the point of steaming, such as in the microwave oven. ? Do not drink unpasteurized milk.  Avoid toxoplasmosis infection during pregnancy. Follow these precautions: ? Do not change your cat's litter box, if you have a cat. Ask someone else to do this for you. ? Wear gardening gloves while working in the yard.  General Instructions  Keep all follow-up visits as directed by your health care provider. This is important. This includes prenatal care and screening tests.  Manage any chronic health conditions. Work closely with your health care provider to keep conditions, such as diabetes, under control.  How do I know if my baby is developing well? At each prenatal visit, your health care provider will do several different tests to check on your health and keep track of your babys development. These include:  Fundal height. ? Your health care provider will  measure your growing belly from top to bottom using a tape measure. ? Your health care provider will also feel your belly to determine your baby's position.  Heartbeat. ? An ultrasound in the first trimester can confirm pregnancy and show a heartbeat, depending on how far along you are. ? Your health care provider will check your baby's heart rate at every prenatal visit. ? As you get closer to your delivery date, you may have regular fetal heart rate monitoring to make sure that your baby is not in distress.  Second trimester ultrasound. ? This ultrasound checks your baby's development. It also indicates your babys gender.  What should I do if I have concerns about my baby's development? Always talk with your health care provider about any concerns that you may have. This information is not intended to replace advice given to you by your health care provider. Make sure you discuss any questions you have with your health care provider. Document Released: 02/15/2008 Document Revised: 02/04/2016 Document Reviewed: 02/05/2014 Elsevier Interactive Patient Education  [employer].

## 2018-07-22 NOTE — MAU Note (Signed)
Pt presents to MAU with c/o upper abdominal cramping that started x 4 days and light pink to brown discharge that started x 2 days. Pt had +HPT x 2 weeks ago. LMP-06/03/2018

## 2018-07-23 LAB — CULTURE, OB URINE

## 2018-07-23 LAB — GC/CHLAMYDIA PROBE AMP (~~LOC~~) NOT AT ARMC
Chlamydia: NEGATIVE
Neisseria Gonorrhea: NEGATIVE

## 2018-07-23 LAB — HIV ANTIBODY (ROUTINE TESTING W REFLEX): HIV Screen 4th Generation wRfx: NONREACTIVE

## 2018-08-20 ENCOUNTER — Ambulatory Visit (INDEPENDENT_AMBULATORY_CARE_PROVIDER_SITE_OTHER): Payer: Medicaid Other | Admitting: Advanced Practice Midwife

## 2018-08-20 ENCOUNTER — Encounter: Payer: Self-pay | Admitting: Obstetrics

## 2018-08-20 ENCOUNTER — Other Ambulatory Visit (HOSPITAL_COMMUNITY)
Admission: RE | Admit: 2018-08-20 | Discharge: 2018-08-20 | Disposition: A | Discharge status: Home / Self Care | Payer: Medicaid Other | Source: Ambulatory Visit | Attending: Advanced Practice Midwife | Admitting: Advanced Practice Midwife

## 2018-08-20 ENCOUNTER — Encounter: Payer: Self-pay | Admitting: Advanced Practice Midwife

## 2018-08-20 DIAGNOSIS — O09899 Supervision of other high risk pregnancies, unspecified trimester: Secondary | ICD-10-CM | POA: Insufficient documentation

## 2018-08-20 DIAGNOSIS — Z3481 Encounter for supervision of other normal pregnancy, first trimester: Secondary | ICD-10-CM

## 2018-08-20 DIAGNOSIS — Z3480 Encounter for supervision of other normal pregnancy, unspecified trimester: Secondary | ICD-10-CM | POA: Diagnosis present

## 2018-08-20 DIAGNOSIS — O09891 Supervision of other high risk pregnancies, first trimester: Secondary | ICD-10-CM

## 2018-08-20 DIAGNOSIS — O34211 Maternal care for low transverse scar from previous cesarean delivery: Secondary | ICD-10-CM

## 2018-08-20 DIAGNOSIS — O34219 Maternal care for unspecified type scar from previous cesarean delivery: Secondary | ICD-10-CM | POA: Insufficient documentation

## 2018-08-20 HISTORY — DX: Supervision of other high risk pregnancies, unspecified trimester: O09.899

## 2018-08-20 NOTE — Patient Instructions (Signed)
First Trimester of Pregnancy The first trimester of pregnancy is from week 1 until the end of week 13 (months 1 through 3). A week after a sperm fertilizes an egg, the egg will implant on the wall of the uterus. This embryo will begin to develop into a baby. Genes from you and your partner will form the baby. The female genes will determine whether the baby will be a boy or a girl. At 6-8 weeks, the eyes and face will be formed, and the heartbeat can be seen on ultrasound. At the end of 12 weeks, all the baby's organs will be formed. Now that you are pregnant, you will want to do everything you can to have a healthy baby. Two of the most important things are to get good prenatal care and to follow your health care provider's instructions. Prenatal care is all the medical care you receive before the baby's birth. This care will help prevent, find, and treat any problems during the pregnancy and childbirth. Body changes during your first trimester Your body goes through many changes during pregnancy. The changes vary from woman to woman.  You may gain or lose a couple of pounds at first.  You may feel sick to your stomach (nauseous) and you may throw up (vomit). If the vomiting is uncontrollable, call your health care provider.  You may tire easily.  You may develop headaches that can be relieved by medicines. All medicines should be approved by your health care provider.  You may urinate more often. Painful urination may mean you have a bladder infection.  You may develop heartburn as a result of your pregnancy.  You may develop constipation because certain hormones are causing the muscles that push stool through your intestines to slow down.  You may develop hemorrhoids or swollen veins (varicose veins).  Your breasts may begin to grow larger and become tender. Your nipples may stick out more, and the tissue that surrounds them (areola) may become darker.  Your gums may bleed and may be  sensitive to brushing and flossing.  Dark spots or blotches (chloasma, mask of pregnancy) may develop on your face. This will likely fade after the baby is born.  Your menstrual periods will stop.  You may have a loss of appetite.  You may develop cravings for certain kinds of food.  You may have changes in your emotions from day to day, such as being excited to be pregnant or being concerned that something may go wrong with the pregnancy and baby.  You may have more vivid and strange dreams.  You may have changes in your hair. These can include thickening of your hair, rapid growth, and changes in texture. Some women also have hair loss during or after pregnancy, or hair that feels dry or thin. Your hair will most likely return to normal after your baby is born.  What to expect at prenatal visits During a routine prenatal visit:  You will be weighed to make sure you and the baby are growing normally.  Your blood pressure will be taken.  Your abdomen will be measured to track your baby's growth.  The fetal heartbeat will be listened to between weeks 10 and 14 of your pregnancy.  Test results from any previous visits will be discussed.  Your health care provider may ask you:  How you are feeling.  If you are feeling the baby move.  If you have had any abnormal symptoms, such as leaking fluid, bleeding, severe headaches,   or abdominal cramping.  If you are using any tobacco products, including cigarettes, chewing tobacco, and electronic cigarettes.  If you have any questions.  Other tests that may be performed during your first trimester include:  Blood tests to find your blood type and to check for the presence of any previous infections. The tests will also be used to check for low iron levels (anemia) and protein on red blood cells (Rh antibodies). Depending on your risk factors, or if you previously had diabetes during pregnancy, you may have tests to check for high blood  sugar that affects pregnant women (gestational diabetes).  Urine tests to check for infections, diabetes, or protein in the urine.  An ultrasound to confirm the proper growth and development of the baby.  Fetal screens for spinal cord problems (spina bifida) and Down syndrome.  HIV (human immunodeficiency virus) testing. Routine prenatal testing includes screening for HIV, unless you choose not to have this test.  You may need other tests to make sure you and the baby are doing well.  Follow these instructions at home: Medicines  Follow your health care provider's instructions regarding medicine use. Specific medicines may be either safe or unsafe to take during pregnancy.  Take a prenatal vitamin that contains at least 600 micrograms (mcg) of folic acid.  If you develop constipation, try taking a stool softener if your health care provider approves. Eating and drinking  Eat a balanced diet that includes fresh fruits and vegetables, whole grains, good sources of protein such as meat, eggs, or tofu, and low-fat dairy. Your health care provider will help you determine the amount of weight gain that is right for you.  Avoid raw meat and uncooked cheese. These carry germs that can cause birth defects in the baby.  Eating four or five small meals rather than three large meals a day may help relieve nausea and vomiting. If you start to feel nauseous, eating a few soda crackers can be helpful. Drinking liquids between meals, instead of during meals, also seems to help ease nausea and vomiting.  Limit foods that are high in fat and processed sugars, such as fried and sweet foods.  To prevent constipation: ? Eat foods that are high in fiber, such as fresh fruits and vegetables, whole grains, and beans. ? Drink enough fluid to keep your urine clear or pale yellow. Activity  Exercise only as directed by your health care provider. Most women can continue their usual exercise routine during  pregnancy. Try to exercise for 30 minutes at least 5 days a week. Exercising will help you: ? Control your weight. ? Stay in shape. ? Be prepared for labor and delivery.  Experiencing pain or cramping in the lower abdomen or lower back is a good sign that you should stop exercising. Check with your health care provider before continuing with normal exercises.  Try to avoid standing for long periods of time. Move your legs often if you must stand in one place for a long time.  Avoid heavy lifting.  Wear low-heeled shoes and practice good posture.  You may continue to have sex unless your health care provider tells you not to. Relieving pain and discomfort  Wear a good support bra to relieve breast tenderness.  Take warm sitz baths to soothe any pain or discomfort caused by hemorrhoids. Use hemorrhoid cream if your health care provider approves.  Rest with your legs elevated if you have leg cramps or low back pain.  If you develop   varicose veins in your legs, wear support hose. Elevate your feet for 15 minutes, 3-4 times a day. Limit salt in your diet. Prenatal care  Schedule your prenatal visits by the twelfth week of pregnancy. They are usually scheduled monthly at first, then more often in the last 2 months before delivery.  Write down your questions. Take them to your prenatal visits.  Keep all your prenatal visits as told by your health care provider. This is important. Safety  Wear your seat belt at all times when driving.  Make a list of emergency phone numbers, including numbers for family, friends, the hospital, and police and fire departments. General instructions  Ask your health care provider for a referral to a local prenatal education class. Begin classes no later than the beginning of month 6 of your pregnancy.  Ask for help if you have counseling or nutritional needs during pregnancy. Your health care provider can offer advice or refer you to specialists for help  with various needs.  Do not use hot tubs, steam rooms, or saunas.  Do not douche or use tampons or scented sanitary pads.  Do not cross your legs for long periods of time.  Avoid cat litter boxes and soil used by cats. These carry germs that can cause birth defects in the baby and possibly loss of the fetus by miscarriage or stillbirth.  Avoid all smoking, herbs, alcohol, and medicines not prescribed by your health care provider. Chemicals in these products affect the formation and growth of the baby.  Do not use any products that contain nicotine or tobacco, such as cigarettes and e-cigarettes. If you need help quitting, ask your health care provider. You may receive counseling support and other resources to help you quit.  Schedule a dentist appointment. At home, brush your teeth with a soft toothbrush and be gentle when you floss. Contact a health care provider if:  You have dizziness.  You have mild pelvic cramps, pelvic pressure, or nagging pain in the abdominal area.  You have persistent nausea, vomiting, or diarrhea.  You have a bad smelling vaginal discharge.  You have pain when you urinate.  You notice increased swelling in your face, hands, legs, or ankles.  You are exposed to fifth disease or chickenpox.  You are exposed to German measles (rubella) and have never had it. Get help right away if:  You have a fever.  You are leaking fluid from your vagina.  You have spotting or bleeding from your vagina.  You have severe abdominal cramping or pain.  You have rapid weight gain or loss.  You vomit blood or material that looks like coffee grounds.  You develop a severe headache.  You have shortness of breath.  You have any kind of trauma, such as from a fall or a car accident. Summary  The first trimester of pregnancy is from week 1 until the end of week 13 (months 1 through 3).  Your body goes through many changes during pregnancy. The changes vary from  woman to woman.  You will have routine prenatal visits. During those visits, your health care provider will examine you, discuss any test results you may have, and talk with you about how you are feeling. This information is not intended to replace advice given to you by your health care provider. Make sure you discuss any questions you have with your health care provider. Document Released: 08/23/2001 Document Revised: 08/10/2016 Document Reviewed: 08/10/2016 Elsevier Interactive Patient Education  2018 [employer]   Inc[employer]  

## 2018-08-20 NOTE — Progress Notes (Signed)
   PRENATAL VISIT NOTE  Subjective:  Courtney [name] is a 27 y.o. [phone] at 257w4d being seen today for initial prenatal visit. She is currently monitored for the following issues for this low-risk pregnancy and has Common migraine with intractable migraine; Missed abortion; and Supervision of other normal pregnancy, antepartum on their problem list.  Patient reports no complaints.  Contractions: Not present. Vag. Bleeding: None.  Movement: Absent. Denies leaking of fluid.   The following portions of the patient's history were reviewed and updated as appropriate: allergies, current medications, past family history, past medical history, past social history, past surgical history and problem list. Problem list updated.  Objective:   Vitals:   08/20/18 1349  BP: 132/71  Pulse: 97  Weight: 81.9 kg    Fetal Status: Fetal Heart Rate (bpm): +sono   Movement: Absent     BP 132/71   Pulse 97   Wt 81.9 kg   LMP 06/03/2018 (Exact Date)   BMI 33.03 kg/m    VS reviewed, nursing note reviewed,  Constitutional: well developed, well nourished, no distress HEENT: normocephalic CV: normal rate Pulm/chest wall: normal effort Breast Exam:  right breast normal without mass, skin or nipple changes or axillary nodes, left breast normal without mass, skin or nipple changes or axillary nodes Abdomen: soft Neuro: alert and oriented x 3 Skin: warm, dry Psych: affect normal Pelvic exam: Cerv nonenlargedix pink, visually closed, without lesion, scant white creamy discharge, vaginal walls and external genitalia normal Bimanual exam: Cervix 0/long/high, firm, anterior, neg CMT, uterus nontender,, adnexa without tenderness, enlargement, or mass  Assessment and Plan:  Pregnancy: [id number] at 2357w4d  1. Supervision of other normal pregnancy, antepartum --Anticipatory guidance about next visits/weeks of pregnancy given. --Discussed and offered genetic screening options, including Quad screen/AFP, NIPS  testing, and option to decline testing. Benefits/risks/alternatives reviewed. Pt aware that anatomy US is form of genetic screening with lower accuracy in detecting trisomies than blood work.  Pt chooses NIPS for genetic screening today. --Initial labs drawn. Continue prenatal vitamins. --Problem list reviewed and updated. --The nature [facility] with multiple MDs and other Advanced Practice Providers was explained to patient; also emphasized that residents, students are part of our team. --Routine obstetric precautions reviewed. --Return in 4 weeks - Obstetric Panel, Including HIV - Genetic Screening - Hemoglobinopathy evaluation - Cystic Fibrosis Mutation 97 - SMN1 COPY NUMBER ANALYSIS (SMA Carrier Screen) - [facility] AMB BABYSCRIPTS OPT IN - Culture, OB Urine - Cervicovaginal ancillary only( [place]) - Cytology - PAP( [place]) - US bedside  Preterm labor symptoms and general obstetric precautions including but not limited to vaginal bleeding, contractions, leaking of fluid and fetal movement were reviewed in detail with the patient. Please refer to After Visit Summary for other counseling recommendations.  No follow-ups on file.  No future appointments.  Sharen CounterLisa Leftwich-Kirby, CNM

## 2018-08-20 NOTE — Progress Notes (Signed)
Pt presents for NOB with questions about EDD.

## 2018-08-21 LAB — CERVICOVAGINAL ANCILLARY ONLY
BACTERIAL VAGINITIS: POSITIVE — AB
CANDIDA VAGINITIS: NEGATIVE
CHLAMYDIA, DNA PROBE: NEGATIVE
Neisseria Gonorrhea: NEGATIVE
TRICH (WINDOWPATH): NEGATIVE

## 2018-08-22 LAB — CULTURE, OB URINE

## 2018-08-22 LAB — URINE CULTURE, OB REFLEX

## 2018-08-23 LAB — HEMOGLOBINOPATHY EVALUATION
HGB A: 97.9 % (ref 96.4–98.8)
HGB C: 0 %
HGB S: 0 %
HGB VARIANT: 0 %
Hemoglobin A2 Quantitation: 2.1 % (ref 1.8–3.2)
Hemoglobin F Quantitation: 0 % (ref 0.0–2.0)

## 2018-08-23 LAB — OBSTETRIC PANEL, INCLUDING HIV
Antibody Screen: NEGATIVE
Basophils Absolute: 0 10*3/uL (ref 0.0–0.2)
Basos: 0 %
EOS (ABSOLUTE): 0.1 10*3/uL (ref 0.0–0.4)
EOS: 1 %
HEMATOCRIT: 35.7 % (ref 34.0–46.6)
HEP B S AG: NEGATIVE
HIV Screen 4th Generation wRfx: NONREACTIVE
Hemoglobin: 11.7 g/dL (ref 11.1–15.9)
IMMATURE GRANS (ABS): 0 10*3/uL (ref 0.0–0.1)
IMMATURE GRANULOCYTES: 0 %
LYMPHS ABS: 2.2 10*3/uL (ref 0.7–3.1)
Lymphs: 24 %
MCH: 29.8 pg (ref 26.6–33.0)
MCHC: 32.8 g/dL (ref 31.5–35.7)
MCV: 91 fL (ref 79–97)
MONOCYTES: 6 %
Monocytes Absolute: 0.5 10*3/uL (ref 0.1–0.9)
NEUTROS PCT: 69 %
Neutrophils Absolute: 6.4 10*3/uL (ref 1.4–7.0)
Platelets: 229 10*3/uL (ref 150–450)
RBC: 3.93 x10E6/uL (ref 3.77–5.28)
RDW: 12.6 % (ref 12.3–15.4)
RPR: NONREACTIVE
Rh Factor: POSITIVE
Rubella Antibodies, IGG: 3.59 index (ref >0.99)
WBC: 9.3 10*3/uL (ref 3.4–10.8)

## 2018-08-28 ENCOUNTER — Encounter: Payer: Self-pay | Admitting: *Deleted

## 2018-08-29 LAB — SMN1 COPY NUMBER ANALYSIS (SMA CARRIER SCREENING)

## 2018-08-29 LAB — CYSTIC FIBROSIS MUTATION 97: Interpretation: NOT DETECTED

## 2018-09-12 NOTE — L&D Delivery Note (Signed)
Patient is a 28 y.o. now G4P3 s/p NSVD at [redacted]w[redacted]d, who was admitted for SOL.  She progressed with augmentation (pitocin) to complete and pushed 7 minutes to deliver.  Cord clamping delayed by several minutes then clamped by CNM and cut by FOB.  Placenta intact and spontaneous, bleeding minimal. Mom and baby stable prior to transfer to postpartum. She plans on breastfeeding. She is unsure of method for birth control.  Delivery Note At 7:54 AM a viable and healthy female was delivered via  (Presentation: ROA, compound presentation ).  APGAR: 7, 9; weight pending .   Placenta intact and spontaneous, bleeding minimal. 3V Cord: with no complications. Cord pH: pending  Anesthesia:  IV Fentanyl around 0630, no epidural  Episiotomy: None Lacerations: None Suture Repair:  None Est. Blood Loss (mL): 11  Mom to postpartum.  Baby to Couplet care / Skin to Skin.  Lajean Manes CNM 03/04/2019, 8:12 AM

## 2018-09-17 ENCOUNTER — Ambulatory Visit (INDEPENDENT_AMBULATORY_CARE_PROVIDER_SITE_OTHER): Payer: Medicaid Other | Admitting: Advanced Practice Midwife

## 2018-09-17 ENCOUNTER — Encounter: Payer: Self-pay | Admitting: Advanced Practice Midwife

## 2018-09-17 VITALS — BP 122/78 | HR 93 | Wt 182.0 lb

## 2018-09-17 DIAGNOSIS — O34219 Maternal care for unspecified type scar from previous cesarean delivery: Secondary | ICD-10-CM

## 2018-09-17 DIAGNOSIS — Z3480 Encounter for supervision of other normal pregnancy, unspecified trimester: Secondary | ICD-10-CM

## 2018-09-17 DIAGNOSIS — Z3A19 19 weeks gestation of pregnancy: Secondary | ICD-10-CM

## 2018-09-17 DIAGNOSIS — L309 Dermatitis, unspecified: Secondary | ICD-10-CM

## 2018-09-17 DIAGNOSIS — O09899 Supervision of other high risk pregnancies, unspecified trimester: Secondary | ICD-10-CM

## 2018-09-17 DIAGNOSIS — O09892 Supervision of other high risk pregnancies, second trimester: Secondary | ICD-10-CM

## 2018-09-17 DIAGNOSIS — Z3482 Encounter for supervision of other normal pregnancy, second trimester: Secondary | ICD-10-CM

## 2018-09-17 MED ORDER — HYDROCORTISONE 0.5 % EX CREA: 1.0000 "application " | TOPICAL_CREAM | Freq: Two times a day (BID) | CUTANEOUS | 0 refills | Status: DC

## 2018-09-17 NOTE — Progress Notes (Signed)
   PRENATAL VISIT NOTE  Subjective:  Courtney [name] is a 28 y.o. [phone] at [redacted]w[redacted]d being seen today for ongoing prenatal care.  She is currently monitored for the following issues for this low-risk pregnancy and has Common migraine with intractable migraine; Missed abortion; Supervision of other normal pregnancy, antepartum; History of cesarean section complicating pregnancy; and Short interval between pregnancies complicating pregnancy, antepartum on their problem list.  Patient reports itching with eczema on hands and elbows.  Contractions: Not present. Vag. Bleeding: None.  Movement: Absent. Denies leaking of fluid.   The following portions of the patient's history were reviewed and updated as appropriate: allergies, current medications, past family history, past medical history, past social history, past surgical history and problem list. Problem list updated.  Objective:   Vitals:   09/17/18 1306  BP: 122/78  Pulse: 93  Weight: 82.6 kg    Fetal Status: Fetal Heart Rate (bpm): 156   Movement: Absent     General:  Alert, oriented and cooperative. Patient is in no acute distress.  Skin: Skin is warm and dry. No rash noted.   Cardiovascular: Normal heart rate noted  Respiratory: Normal respiratory effort, no problems with respiration noted  Abdomen: Soft, gravid, appropriate for gestational age.  Pain/Pressure: Absent     Pelvic: Cervical exam deferred        Extremities: Normal range of motion.  Edema: None  Mental Status: Normal mood and affect. Normal behavior. Normal judgment and thought content.   Assessment and Plan:  Pregnancy: [id number] at [redacted]w[redacted]d  1. Supervision of other normal pregnancy, antepartum --Anticipatory guidance about next visits/weeks of pregnancy given. - AFP, Serum, Open Spina Bifida  2. History of cesarean section complicating pregnancy --Vag delivery with first pregnancy, then C/S 01/17/2018.  3. Short interval between pregnancies complicating  pregnancy, antepartum  4. Eczema, unspecified type - hydrocortisone cream 0.5 %; Apply 1 application topically 2 (two) times daily.  Dispense: 30 g; Refill: 0  Preterm labor symptoms and general obstetric precautions including but not limited to vaginal bleeding, contractions, leaking of fluid and fetal movement were reviewed in detail with the patient. Please refer to After Visit Summary for other counseling recommendations.  Return in about 4 weeks (around 10/15/2018).  Future Appointments  Date Time Provider Department Center  10/15/2018  1:15 PM [facility] [place] [facility]    Sharen Counter, CNM

## 2018-09-17 NOTE — Patient Instructions (Signed)

## 2018-09-17 NOTE — Progress Notes (Signed)
CC: skin irritation Chesley Noon/eczema

## 2018-09-19 LAB — AFP, SERUM, OPEN SPINA BIFIDA
AFP MOM: 0.56
AFP VALUE AFPOSL: 14.5 ng/mL
Gest. Age on Collection Date: 15.1 weeks
Maternal Age At EDD: 30.2 yr
OSBR RISK 1 IN: 10000
Test Results:: NEGATIVE
WEIGHT: 182 [lb_av]

## 2018-10-08 ENCOUNTER — Encounter (HOSPITAL_COMMUNITY): Payer: Self-pay

## 2018-10-15 ENCOUNTER — Ambulatory Visit (HOSPITAL_COMMUNITY)
Admission: RE | Admit: 2018-10-15 | Discharge: 2018-10-15 | Disposition: A | Discharge status: Home / Self Care | Payer: Medicaid Other | Source: Ambulatory Visit | Attending: Advanced Practice Midwife | Admitting: Advanced Practice Midwife

## 2018-10-15 ENCOUNTER — Ambulatory Visit (INDEPENDENT_AMBULATORY_CARE_PROVIDER_SITE_OTHER): Payer: Self-pay | Admitting: Advanced Practice Midwife

## 2018-10-15 ENCOUNTER — Other Ambulatory Visit (HOSPITAL_COMMUNITY): Payer: Self-pay | Admitting: *Deleted

## 2018-10-15 ENCOUNTER — Other Ambulatory Visit: Payer: Self-pay | Admitting: Advanced Practice Midwife

## 2018-10-15 VITALS — BP 120/73 | HR 86 | Wt 184.0 lb

## 2018-10-15 DIAGNOSIS — Z3482 Encounter for supervision of other normal pregnancy, second trimester: Secondary | ICD-10-CM

## 2018-10-15 DIAGNOSIS — Z363 Encounter for antenatal screening for malformations: Secondary | ICD-10-CM

## 2018-10-15 DIAGNOSIS — Z362 Encounter for other antenatal screening follow-up: Secondary | ICD-10-CM

## 2018-10-15 DIAGNOSIS — Z3480 Encounter for supervision of other normal pregnancy, unspecified trimester: Secondary | ICD-10-CM

## 2018-10-15 DIAGNOSIS — Z3A19 19 weeks gestation of pregnancy: Secondary | ICD-10-CM

## 2018-10-15 DIAGNOSIS — O09892 Supervision of other high risk pregnancies, second trimester: Secondary | ICD-10-CM | POA: Diagnosis not present

## 2018-10-15 DIAGNOSIS — O99212 Obesity complicating pregnancy, second trimester: Secondary | ICD-10-CM

## 2018-10-15 DIAGNOSIS — K429 Umbilical hernia without obstruction or gangrene: Secondary | ICD-10-CM | POA: Insufficient documentation

## 2018-10-15 DIAGNOSIS — O34219 Maternal care for unspecified type scar from previous cesarean delivery: Secondary | ICD-10-CM

## 2018-10-15 MED ORDER — ABDOMINAL BINDER/ELASTIC LARGE MISC: 1.0000 | Freq: Every day | 0 refills | Status: DC

## 2018-10-15 NOTE — Patient Instructions (Signed)

## 2018-10-15 NOTE — Progress Notes (Signed)
   PRENATAL VISIT NOTE  Subjective:  Courtney [name] is a 28 y.o. [phone] at [redacted]w[redacted]d being seen today for ongoing prenatal care.  She is currently monitored for the following issues for this low-risk pregnancy and has Common migraine with intractable migraine; Missed abortion; Supervision of other normal pregnancy, antepartum; History of cesarean section complicating pregnancy; and Short interval between pregnancies complicating pregnancy, antepartum on their problem list.  Patient reports pain at umbilicus intermittently due to hernia.  Contractions: Not present. Vag. Bleeding: None.  Movement: Present. Denies leaking of fluid.   The following portions of the patient's history were reviewed and updated as appropriate: allergies, current medications, past family history, past medical history, past social history, past surgical history and problem list. Problem list updated.  Objective:   Vitals:   10/15/18 1447  BP: 120/73  Pulse: 86  Weight: 83.5 kg    Fetal Status: Fetal Heart Rate (bpm): 155   Movement: Present     General:  Alert, oriented and cooperative. Patient is in no acute distress.  Skin: Skin is warm and dry. No rash noted.   Cardiovascular: Normal heart rate noted  Respiratory: Normal respiratory effort, no problems with respiration noted  Abdomen: Soft, gravid, appropriate for gestational age.  Pain/Pressure: Absent     Pelvic: Cervical exam deferred        Extremities: Normal range of motion.  Edema: None  Mental Status: Normal mood and affect. Normal behavior. Normal judgment and thought content.   Assessment and Plan:  Pregnancy: [id number] at [redacted]w[redacted]d  1. Supervision of other normal pregnancy, antepartum --Anticipatory guidance about next visits/weeks of pregnancy given. --Anatomy US today  2. History of cesarean section complicating pregnancy --Vaginal delivery x 1 th en C/S for fetal indications in 2019.  3. Umbilical hernia without obstruction and without  gangrene --2 cm x 3 cm bulge at umbilicus, firm with abdominal pressure, soft and reduces easily when pt relaxed/supine.  Diastasis recti also separated, difficult to assess amount in pregnancy. --Abdominal binder Rx, pt encouraged to eat high fiber diet, avoid constipation, warning signs reviewed for incarceration of hernia --Reevaluate postpartum to see if needs surgical intervention  Preterm labor symptoms and general obstetric precautions including but not limited to vaginal bleeding, contractions, leaking of fluid and fetal movement were reviewed in detail with the patient. Please refer to After Visit Summary for other counseling recommendations.  No follow-ups on file.  Future Appointments  Date Time Provider Department Center  11/12/2018 12:45 PM [facility] [place] 2 [facility]    Sharen Counter, CNM

## 2018-11-06 ENCOUNTER — Encounter (HOSPITAL_COMMUNITY): Payer: Self-pay | Admitting: *Deleted

## 2018-11-06 ENCOUNTER — Inpatient Hospital Stay (HOSPITAL_COMMUNITY)
Admission: AD | Admit: 2018-11-06 | Discharge: 2018-11-06 | Disposition: A | Discharge status: Home / Self Care | Payer: Medicaid Other | Attending: Obstetrics & Gynecology | Admitting: Obstetrics & Gynecology

## 2018-11-06 ENCOUNTER — Other Ambulatory Visit: Payer: Self-pay

## 2018-11-06 DIAGNOSIS — Z3A22 22 weeks gestation of pregnancy: Secondary | ICD-10-CM | POA: Insufficient documentation

## 2018-11-06 DIAGNOSIS — O26892 Other specified pregnancy related conditions, second trimester: Secondary | ICD-10-CM | POA: Insufficient documentation

## 2018-11-06 DIAGNOSIS — O34219 Maternal care for unspecified type scar from previous cesarean delivery: Secondary | ICD-10-CM

## 2018-11-06 DIAGNOSIS — O09899 Supervision of other high risk pregnancies, unspecified trimester: Secondary | ICD-10-CM

## 2018-11-06 DIAGNOSIS — R1013 Epigastric pain: Secondary | ICD-10-CM | POA: Insufficient documentation

## 2018-11-06 DIAGNOSIS — K2100 Gastro-esophageal reflux disease with esophagitis, without bleeding: Secondary | ICD-10-CM

## 2018-11-06 DIAGNOSIS — K429 Umbilical hernia without obstruction or gangrene: Secondary | ICD-10-CM | POA: Diagnosis not present

## 2018-11-06 DIAGNOSIS — R109 Unspecified abdominal pain: Secondary | ICD-10-CM | POA: Diagnosis present

## 2018-11-06 LAB — COMPREHENSIVE METABOLIC PANEL
ALT: 11 U/L (ref 0–44)
ANION GAP: 10 (ref 5–15)
AST: 13 U/L — ABNORMAL LOW (ref 15–41)
Albumin: 3.2 g/dL — ABNORMAL LOW (ref 3.5–5.0)
Alkaline Phosphatase: 60 U/L (ref 38–126)
BUN: <5 mg/dL — ABNORMAL LOW (ref 6–20)
CALCIUM: 8.4 mg/dL — AB (ref 8.9–10.3)
CO2: 22 mmol/L (ref 22–32)
Chloride: 105 mmol/L (ref 98–111)
Creatinine, Ser: 0.41 mg/dL — ABNORMAL LOW (ref 0.44–1.00)
GFR calc non Af Amer: >60 mL/min (ref >60)
Glucose, Bld: 92 mg/dL (ref 70–99)
Potassium: 3.7 mmol/L (ref 3.5–5.1)
Sodium: 137 mmol/L (ref 135–145)
Total Bilirubin: 0.4 mg/dL (ref 0.3–1.2)
Total Protein: 6.5 g/dL (ref 6.5–8.1)

## 2018-11-06 LAB — URINALYSIS, ROUTINE W REFLEX MICROSCOPIC
BILIRUBIN URINE: NEGATIVE
GLUCOSE, UA: NEGATIVE mg/dL
HGB URINE DIPSTICK: NEGATIVE
KETONES UR: 80 mg/dL — AB
NITRITE: NEGATIVE
PROTEIN: NEGATIVE mg/dL
Specific Gravity, Urine: 1.023 (ref 1.005–1.030)
pH: 5 (ref 5.0–8.0)

## 2018-11-06 LAB — CBC
HCT: 35.9 % — ABNORMAL LOW (ref 36.0–46.0)
Hemoglobin: 11.7 g/dL — ABNORMAL LOW (ref 12.0–15.0)
MCH: 30.2 pg (ref 26.0–34.0)
MCHC: 32.6 g/dL (ref 30.0–36.0)
MCV: 92.5 fL (ref 80.0–100.0)
PLATELETS: 180 10*3/uL (ref 150–400)
RBC: 3.88 MIL/uL (ref 3.87–5.11)
RDW: 13.1 % (ref 11.5–15.5)
WBC: 11.5 10*3/uL — ABNORMAL HIGH (ref 4.0–10.5)
nRBC: 0 % (ref 0.0–0.2)

## 2018-11-06 LAB — LIPASE, BLOOD: Lipase: 29 U/L (ref 11–51)

## 2018-11-06 MED ORDER — ALUM & MAG HYDROXIDE-SIMETH 200-200-20 MG/5ML PO SUSP
30.0000 mL | Freq: Once | ORAL | Status: AC
  Administered 2018-11-06: 30 mL via ORAL
  Filled 2018-11-06: qty 30

## 2018-11-06 MED ORDER — FAMOTIDINE 20 MG PO TABS: 20.0000 mg | ORAL_TABLET | Freq: Every day | ORAL | 1 refills | Status: DC

## 2018-11-06 MED ORDER — LIDOCAINE VISCOUS HCL 2 % MT SOLN
15.0000 mL | Freq: Once | OROMUCOSAL | Status: AC
  Administered 2018-11-06: 15 mL via ORAL
  Filled 2018-11-06: qty 15

## 2018-11-06 NOTE — Discharge Instructions (Signed)
·   Be sure to drink plenty of fluids and follow a bland diet for the next few days.  Take Pepcid in morning to help with reflux. Okay to check twice a day if having symptoms.   Return to MAU if worsening pain, nausea, vomiting, diarrhea. Your symptoms could also be an early sign of what's called "viral gastroenteritis" or stomach virus. The best thing to do for this would be to stay well-hydrated.

## 2018-11-06 NOTE — MAU Note (Signed)
Been having pain in upper abd since 0500.  Comes and goes, is so strong, feels like something is pulling there.

## 2018-11-06 NOTE — MAU Provider Note (Addendum)
History    CSN: [id number] Arrival date and time: 11/06/18 1302  Chief Complaint  Patient presents with  . Abdominal Pain   Abdominal Pain  Associated symptoms include diarrhea, nausea and vomiting. Pertinent negatives include no constipation, dysuria or headaches.   Corabelle S. Rickey [id number] is a 28 year old female that presents to MAU with a chief complaint of upper abdominal pain, nausea, vomiting and diarrhea that started at 5:00 am this morning. Patient is [redacted] weeks pregnant. Abdominal pain is located in the epigastric region, and is worse when she lies down. Pain is described as a constant dull ache, with intermittent sharp cramp-like pains. Denies radiation. Rates pain 10/10 with acute episodes but otherwise dull. Patient had 2 episodes of diarrhea this morning and 1 episode of vomiting. Denies headache, dizziness, chest pain, shortness of breath, constipation, vaginal bleeding, leaking, or vaginal discharge. Patient has not taken anything for her symptoms. PMH of c-section due to a missed abortion (01/17/2018).   OB History    Gravida  4   Para  2   Term  2   Preterm      AB  1   Living  2     SAB  1   TAB      Ectopic      Multiple      Live Births  1           Past Medical History:  Diagnosis Date  . Allergic rhinitis   . Back pain   . Common migraine with intractable migraine 12/16/2014   migraines PRN meds  . Depression    denies  . Eczema   . GERD (gastroesophageal reflux disease)    takes meds PRN   . Infection    UTI  . Scoliosis   . Vitamin D deficiency     Past Surgical History:  Procedure Laterality Date  . CESAREAN SECTION N/A 01/17/2018   Procedure: CESAREAN SECTION;  Surgeon: Philip Aspen, DO;  Location: [facility] BIRTHING SUITES;  Service: Obstetrics;  Laterality: N/A;  . DILATION AND EVACUATION N/A 05/06/2015   Procedure: DILATATION AND EVACUATION;  Surgeon: Edwinna Areola, DO;  Location: [facility] ORS;  Service: Gynecology;  Laterality:  N/A;    Family History  Problem Relation Age of Onset  . Migraines Mother   . Asthma Mother   . Healthy Sister   . Healthy Brother   . Healthy Brother   . Healthy Brother   . Cancer Maternal Grandmother   . Cancer Paternal Grandfather     Social History   Tobacco Use  . Smoking status: Never Smoker  . Smokeless tobacco: Never Used  Substance Use Topics  . Alcohol use: No    Comment: occasionally  . Drug use: No    Allergies:  Allergies  Allergen Reactions  . No Known Allergies     Medications Prior to Admission  Medication Sig Dispense Refill Last Dose  . Elastic Bandages & Supports (ABDOMINAL BINDER/ELASTIC LARGE) MISC 1 Device by Does not apply route daily. 1 each 0   . ferrous gluconate (FERGON) 324 MG tablet Take 1 tablet (324 mg total) by mouth daily with breakfast. 30 tablet 3 Taking  . hydrocortisone cream 0.5 % Apply 1 application topically 2 (two) times daily. 30 g 0   . Prenat w/o A Vit-FeFum-FePo-FA (CONCEPT OB) 130-92.4-1 MG CAPS Take 1 tablet by mouth daily. 30 capsule 12 Taking    Review of Systems  Respiratory: Negative for chest tightness and  shortness of breath.   Cardiovascular: Negative for chest pain and leg swelling.  Gastrointestinal: Positive for abdominal pain, diarrhea, nausea and vomiting. Negative for blood in stool and constipation.  Genitourinary: Negative for dysuria, urgency, vaginal bleeding and vaginal discharge.  Neurological: Negative for light-headedness and headaches.   Physical Exam   Blood pressure 129/74, pulse (!) 118, temperature 98.4 F (36.9 C), temperature source Oral, resp. rate 17, weight 84.4 kg, last menstrual period 06/03/2018, SpO2 100 %, unknown if currently breastfeeding.  Physical Exam  Constitutional: She is oriented to person, place, and time. She appears well-developed and well-nourished.  HENT:  Head: Normocephalic and atraumatic.  Neck: Normal range of motion. Neck supple.  Cardiovascular: Normal rate,  regular rhythm and normal heart sounds.  Respiratory: Effort normal and breath sounds normal.  GI: Soft. Bowel sounds are normal. She exhibits no distension. There is no abdominal tenderness. There is no rebound and no guarding.  Neurological: She is alert and oriented to person, place, and time.  Skin: Skin is warm and dry.  Psychiatric: She has a normal mood and affect.   FHTs 160s   MAU Course  Procedures  MDM -- patient with epigastric pain, radiating up sternum, suspect reflux -- will check CBC, CMP, lipase to ensure no signs of cholecystitis, pancreatitis, infection  CMP and CBC wnl though slight leukocytosis could be sign of infection/inflammation from N/V/D or with pregnancy -- U/A with signs of dehydration SG 1.023, ketones 80, small leukocytes, rare bacteria - asymptomatic but will send for culture  -- pain improved with GI cocktail   Assessment and Plan  27yo [id number] at [redacted]w[redacted]d who presented to MAU with epigastric abdominal pain. Acute onset this morning, radiates up sternum so most suspicious for reflux. No signs/symptoms of gallstones, cholecystitis, pancreatitis. Could certainly be beginning of viral gastroenteritis, though nausea and vomiting have resolved, afebrile. Low suspicion for influenza as no sick contracts, afebrile, no respiratory symptoms. History of umbilical hernia but not signs of strangulation, area soft and reducible. Pain improved with GI cocktail so most likely related to reflux. Will discharge home with Pepcid. Reviewed return precautions, all questions answered. Discussed increased hydration, bland diet for next few days. Discharged home in stable condition.   Johney Maine, PA-S2  Attestation: I have seen this patient and agree with the physician assistant student's documentation. I have examined them separately, and we have discussed the plan of care. Tenderness in epigastric area worsened with palpation, especially over fundus. Otherwise no abdominal  tenderness in other areas.   Cristal Deer. Earlene Plater, DO OB/GYN Fellow

## 2018-11-12 ENCOUNTER — Ambulatory Visit (INDEPENDENT_AMBULATORY_CARE_PROVIDER_SITE_OTHER): Payer: Self-pay | Admitting: Advanced Practice Midwife

## 2018-11-12 ENCOUNTER — Ambulatory Visit (HOSPITAL_COMMUNITY)
Admission: RE | Admit: 2018-11-12 | Discharge: 2018-11-12 | Disposition: A | Discharge status: Home / Self Care | Payer: Medicaid Other | Source: Ambulatory Visit | Attending: Obstetrics and Gynecology | Admitting: Obstetrics and Gynecology

## 2018-11-12 VITALS — BP 113/74 | HR 83 | Wt 188.2 lb

## 2018-11-12 DIAGNOSIS — O34219 Maternal care for unspecified type scar from previous cesarean delivery: Secondary | ICD-10-CM

## 2018-11-12 DIAGNOSIS — Z362 Encounter for other antenatal screening follow-up: Secondary | ICD-10-CM | POA: Diagnosis present

## 2018-11-12 DIAGNOSIS — Z3A23 23 weeks gestation of pregnancy: Secondary | ICD-10-CM

## 2018-11-12 DIAGNOSIS — O09892 Supervision of other high risk pregnancies, second trimester: Secondary | ICD-10-CM | POA: Diagnosis not present

## 2018-11-12 DIAGNOSIS — O99212 Obesity complicating pregnancy, second trimester: Secondary | ICD-10-CM

## 2018-11-12 DIAGNOSIS — Z3480 Encounter for supervision of other normal pregnancy, unspecified trimester: Secondary | ICD-10-CM

## 2018-11-12 NOTE — Progress Notes (Signed)
   PRENATAL VISIT NOTE  Subjective:  Courtney [name] is a 28 y.o. [phone] at [redacted]w[redacted]d being seen today for ongoing prenatal care.  She is currently monitored for the following issues for this low-risk pregnancy and has Common migraine with intractable migraine; Missed abortion; Supervision of other normal pregnancy, antepartum; History of cesarean section complicating pregnancy; Short interval between pregnancies complicating pregnancy, antepartum; and Umbilical hernia without obstruction and without gangrene on their problem list.  Patient reports no complaints.  Contractions: Not present. Vag. Bleeding: None.  Movement: Present. Denies leaking of fluid.   The following portions of the patient's history were reviewed and updated as appropriate: allergies, current medications, past family history, past medical history, past social history, past surgical history and problem list. Problem list updated.  Objective:   Vitals:   11/12/18 1439  BP: 113/74  Pulse: 83  Weight: 85.4 kg    Fetal Status: Fetal Heart Rate (bpm): 164   Movement: Present     General:  Alert, oriented and cooperative. Patient is in no acute distress.  Skin: Skin is warm and dry. No rash noted.   Cardiovascular: Normal heart rate noted  Respiratory: Normal respiratory effort, no problems with respiration noted  Abdomen: Soft, gravid, appropriate for gestational age.  Pain/Pressure: Absent     Pelvic: Cervical exam deferred        Extremities: Normal range of motion.  Edema: None  Mental Status: Normal mood and affect. Normal behavior. Normal judgment and thought content.   Assessment and Plan:  Pregnancy: [id number] at [redacted]w[redacted]d  1. History of cesarean section complicating pregnancy   2. Supervision of other normal pregnancy, antepartum --Anticipatory guidance about next visits/weeks of pregnancy given.   Preterm labor symptoms and general obstetric precautions including but not limited to vaginal bleeding,  contractions, leaking of fluid and fetal movement were reviewed in detail with the patient. Please refer to After Visit Summary for other counseling recommendations.  No follow-ups on file.  No future appointments.  Sharen Counter, CNM

## 2018-11-12 NOTE — Patient Instructions (Signed)

## 2018-11-17 ENCOUNTER — Inpatient Hospital Stay (HOSPITAL_COMMUNITY)
Admission: AD | Admit: 2018-11-17 | Discharge: 2018-11-17 | Disposition: A | Discharge status: Home / Self Care | Payer: Medicaid Other | Attending: Family Medicine | Admitting: Family Medicine

## 2018-11-17 ENCOUNTER — Encounter (HOSPITAL_COMMUNITY): Payer: Self-pay | Admitting: *Deleted

## 2018-11-17 DIAGNOSIS — Z3A23 23 weeks gestation of pregnancy: Secondary | ICD-10-CM | POA: Diagnosis not present

## 2018-11-17 DIAGNOSIS — O26892 Other specified pregnancy related conditions, second trimester: Secondary | ICD-10-CM | POA: Insufficient documentation

## 2018-11-17 DIAGNOSIS — K219 Gastro-esophageal reflux disease without esophagitis: Secondary | ICD-10-CM | POA: Diagnosis not present

## 2018-11-17 DIAGNOSIS — R110 Nausea: Secondary | ICD-10-CM | POA: Insufficient documentation

## 2018-11-17 DIAGNOSIS — R42 Dizziness and giddiness: Secondary | ICD-10-CM | POA: Insufficient documentation

## 2018-11-17 DIAGNOSIS — O99012 Anemia complicating pregnancy, second trimester: Secondary | ICD-10-CM | POA: Diagnosis not present

## 2018-11-17 DIAGNOSIS — M419 Scoliosis, unspecified: Secondary | ICD-10-CM | POA: Diagnosis not present

## 2018-11-17 DIAGNOSIS — Z79899 Other long term (current) drug therapy: Secondary | ICD-10-CM | POA: Diagnosis not present

## 2018-11-17 DIAGNOSIS — D649 Anemia, unspecified: Secondary | ICD-10-CM | POA: Insufficient documentation

## 2018-11-17 LAB — CBC
HCT: 32.4 % — ABNORMAL LOW (ref 36.0–46.0)
Hemoglobin: 10.7 g/dL — ABNORMAL LOW (ref 12.0–15.0)
MCH: 30.7 pg (ref 26.0–34.0)
MCHC: 33 g/dL (ref 30.0–36.0)
MCV: 93.1 fL (ref 80.0–100.0)
Platelets: 209 10*3/uL (ref 150–400)
RBC: 3.48 MIL/uL — ABNORMAL LOW (ref 3.87–5.11)
RDW: 13.1 % (ref 11.5–15.5)
WBC: 10.7 10*3/uL — ABNORMAL HIGH (ref 4.0–10.5)
nRBC: 0 % (ref 0.0–0.2)

## 2018-11-17 LAB — URINALYSIS, ROUTINE W REFLEX MICROSCOPIC
BILIRUBIN URINE: NEGATIVE
Glucose, UA: NEGATIVE mg/dL
Hgb urine dipstick: NEGATIVE
Ketones, ur: NEGATIVE mg/dL
Nitrite: NEGATIVE
PROTEIN: 30 mg/dL — AB
Specific Gravity, Urine: 1.03 (ref 1.005–1.030)
pH: 7 (ref 5.0–8.0)

## 2018-11-17 MED ORDER — FERROUS SULFATE 325 (65 FE) MG PO TABS: 325.0000 mg | ORAL_TABLET | Freq: Every day | ORAL | 1 refills | Status: DC

## 2018-11-17 MED ORDER — SCOPOLAMINE 1 MG/3DAYS TD PT72: 1.0000 | MEDICATED_PATCH | TRANSDERMAL | 1 refills | Status: AC

## 2018-11-17 NOTE — Discharge Instructions (Signed)
Primary care follow up  [facility] (will see you even if you do not have sickle cell): [phone] [facility]: [place] [facility]: [place]   Vertigo  Vertigo means that you feel like you are moving when you are not. Vertigo can also make you feel like things around you are moving when they are not. This feeling can come and go at any time. Vertigo often goes away on its own. Follow these instructions at home:  Avoid making fast movements.  Avoid driving.  Avoid using heavy machinery.  Avoid doing any task or activity that might cause danger to you or other people if you would have a vertigo attack while you are doing it.  Sit down right away if you feel dizzy or have trouble with your balance.  Take over-the-counter and prescription medicines only as told by your doctor.  Follow instructions from your doctor about which positions or movements you should avoid.  Drink enough fluid to keep your pee (urine) clear or pale yellow.  Keep all follow-up visits as told by your doctor. This is important. Contact a doctor if:  Medicine does not help your vertigo.  You have a fever.  Your problems get worse or you have new symptoms.  Your family or friends see changes in your behavior.  You feel sick to your stomach (nauseous) or you throw up (vomit).  You have a pins and needles feeling or you are numb in part of your body. Get help right away if:  You have trouble moving or talking.  You are always dizzy.  You pass out (faint).  You get very bad headaches.  You feel weak or have trouble using your hands, arms, or legs.  You have changes in your hearing.  You have changes in your seeing (vision).  You get a stiff neck.  Bright light starts to bother you. This information is not intended to replace advice given to you by your health care provider. Make sure you discuss  any questions you have with your health care provider. Document Released: 06/07/2008 Document Revised: 02/04/2016 Document Reviewed: 12/22/2014 Elsevier Interactive Patient Education  2019 [employer].  Anemia  Anemia is a condition in which you do not have enough red blood cells or hemoglobin. Hemoglobin is a substance in red blood cells that carries oxygen. When you do not have enough red blood cells or hemoglobin (are anemic), your body cannot get enough oxygen and your organs may not work properly. As a result, you may feel very tired or have other problems. What are the causes? Common causes of anemia include:  Excessive bleeding. Anemia can be caused by excessive bleeding inside or outside the body, including bleeding from the intestine or from periods in women.  Poor nutrition.  Long-lasting (chronic) kidney, thyroid, and liver disease.  Bone marrow disorders.  Cancer and treatments for cancer.  HIV (human immunodeficiency virus) and AIDS (acquired immunodeficiency syndrome).  Treatments for HIV and AIDS.  Spleen problems.  Blood disorders.  Infections, medicines, and autoimmune disorders that destroy red blood cells. What are the signs or symptoms? Symptoms of this condition include:  Minor weakness.  Dizziness.  Headache.  Feeling heartbeats that are irregular or faster than normal (palpitations).  Shortness of breath, especially with exercise.  Paleness.  Cold sensitivity.  Indigestion.  Nausea.  Difficulty sleeping.  Difficulty concentrating. Symptoms may occur suddenly or develop slowly. If your anemia is mild, you may not  have symptoms. How is this diagnosed? This condition is diagnosed based on:  Blood tests.  Your medical history.  A physical exam.  Bone marrow biopsy. Your health care provider may also check your stool (feces) for blood and may do additional testing to look for the cause of your bleeding. You may also have other  tests, including:  Imaging tests, such as a CT scan or MRI.  Endoscopy.  Colonoscopy. How is this treated? Treatment for this condition depends on the cause. If you continue to lose a lot of blood, you may need to be treated at a hospital. Treatment may include:  Taking supplements of iron, vitamin O96, or folic acid.  Taking a hormone medicine (erythropoietin) that can help to stimulate red blood cell growth.  Having a blood transfusion. This may be needed if you lose a lot of blood.  Making changes to your diet.  Having surgery to remove your spleen. Follow these instructions at home:  Take over-the-counter and prescription medicines only as told by your health care provider.  Take supplements only as told by your health care provider.  Follow any diet instructions that you were given.  Keep all follow-up visits as told by your health care provider. This is important. Contact a health care provider if:  You develop new bleeding anywhere in the body. Get help right away if:  You are very weak.  You are short of breath.  You have pain in your abdomen or chest.  You are dizzy or feel faint.  You have trouble concentrating.  You have bloody or black, tarry stools.  You vomit repeatedly or you vomit up blood. Summary  Anemia is a condition in which you do not have enough red blood cells or enough of a substance in your red blood cells that carries oxygen (hemoglobin).  Symptoms may occur suddenly or develop slowly.  If your anemia is mild, you may not have symptoms.  This condition is diagnosed with blood tests as well as a medical history and physical exam. Other tests may be needed.  Treatment for this condition depends on the cause of the anemia. This information is not intended to replace advice given to you by your health care provider. Make sure you discuss any questions you have with your health care provider. Document Released: 10/06/2004 Document  Revised: 09/30/2016 Document Reviewed: 09/30/2016 Elsevier Interactive Patient Education  2019 [employer].

## 2018-11-17 NOTE — MAU Provider Note (Signed)
History     CSN: [id number]  Arrival date and time: 11/17/18 1540   None     Chief Complaint  Patient presents with  . Dizziness   [name] is a 28 y.o. [phone] at [redacted]w[redacted]d who presents to MAU for dizziness and feeling like the room is spinning. Pt reports these episodes are accompanied by nausea.  Onset: during first trimester, about 13wks Location: head Duration: each episode "lasts about two minutes" Aggravating/Associated: worse when lying down, position change, turning head from side to side Relieving: sitting still Treatment/Timing: tx none, pt has experienced 4 episodes of dizziness since the first trimester  Pt denies VB, LOF, ctx, decreased FM, vaginal discharge/odor/itching. Problems this pregnancy include: none. Allergies? nkda Current medications/supplements? PNVs only Prenatal care provider? [facility], next appt scheduled 12/10/2018    OB History    Gravida  4   Para  2   Term  2   Preterm      AB  1   Living  2     SAB  1   TAB      Ectopic      Multiple      Live Births  2           Past Medical History:  Diagnosis Date  . Allergic rhinitis   . Back pain   . Common migraine with intractable migraine 12/16/2014   migraines PRN meds  . Depression    denies  . Eczema   . GERD (gastroesophageal reflux disease)    takes meds PRN   . Infection    UTI  . Scoliosis   . Vitamin D deficiency     Past Surgical History:  Procedure Laterality Date  . CESAREAN SECTION N/A 01/17/2018   Procedure: CESAREAN SECTION;  Surgeon: Philip Aspen, DO;  Location: [facility] BIRTHING SUITES;  Service: Obstetrics;  Laterality: N/A;  . DILATION AND EVACUATION N/A 05/06/2015   Procedure: DILATATION AND EVACUATION;  Surgeon: Edwinna Areola, DO;  Location: [facility] ORS;  Service: Gynecology;  Laterality: N/A;    Family History  Problem Relation Age of Onset  . Migraines Mother   . Asthma Mother   . Healthy Sister   . Healthy Brother   . Healthy  Brother   . Healthy Brother   . Cancer Maternal Grandmother   . Cancer Paternal Grandfather     Social History   Tobacco Use  . Smoking status: Never Smoker  . Smokeless tobacco: Never Used  Substance Use Topics  . Alcohol use: No    Comment: occasionally  . Drug use: No    Allergies:  Allergies  Allergen Reactions  . No Known Allergies     Medications Prior to Admission  Medication Sig Dispense Refill Last Dose  . Elastic Bandages & Supports (ABDOMINAL BINDER/ELASTIC LARGE) MISC 1 Device by Does not apply route daily. (Patient not taking: Reported on 11/12/2018) 1 each 0 Not Taking  . famotidine (PEPCID) 20 MG tablet Take 1 tablet (20 mg total) by mouth daily. 30 tablet 1 Taking  . ferrous gluconate (FERGON) 324 MG tablet Take 1 tablet (324 mg total) by mouth daily with breakfast. (Patient not taking: Reported on 11/12/2018) 30 tablet 3 Not Taking  . hydrocortisone cream 0.5 % Apply 1 application topically 2 (two) times daily. (Patient not taking: Reported on 11/12/2018) 30 g 0 Not Taking  . Prenat w/o A Vit-FeFum-FePo-FA (CONCEPT OB) 130-92.4-1 MG CAPS Take 1 tablet by mouth daily. (Patient not taking:  Reported on 11/12/2018) 30 capsule 12 Not Taking    Review of Systems  Constitutional: Negative for chills, fatigue and fever.  Respiratory: Negative for shortness of breath.   Gastrointestinal: Positive for nausea (with dizziness). Negative for abdominal pain, constipation, diarrhea and vomiting.  Genitourinary: Negative for dysuria, frequency, pelvic pain, vaginal bleeding and vaginal discharge.  Neurological: Positive for dizziness (and reports she feels like the room is spinning). Negative for seizures, syncope, speech difficulty, weakness and headaches.   Physical Exam   Blood pressure 129/66, pulse 98, temperature 98 F (36.7 C), temperature source Oral, resp. rate 18, height 5\' 2"  (1.575 m), weight 85.9 kg, last menstrual period 06/03/2018, SpO2 99 %, unknown if currently  breastfeeding.   Patient Vitals for the past 24 hrs:  BP Temp Temp src Pulse Resp SpO2 Height Weight  11/17/18 1620 129/66 - - - - - - -  11/17/18 1559 124/66 98 F (36.7 C) Oral 98 18 99 % - -  11/17/18 1555 - - - - - - 5\' 2"  (1.575 m) 85.9 kg   Orthostatic VS for the past 24 hrs:  BP- Lying Pulse- Lying BP- Sitting Pulse- Sitting BP- Standing at 0 minutes Pulse- Standing at 0 minutes  11/17/18 1620 128/71 95 118/66 99 116/70 110    Physical Exam  Constitutional: She is oriented to person, place, and time. She appears well-developed and well-nourished. No distress.  Respiratory: Effort normal.  GI: Soft.  Neurological: She is alert and oriented to person, place, and time.  Skin: Skin is warm and dry. She is not diaphoretic.  Psychiatric: She has a normal mood and affect. Her behavior is normal.   Results for orders placed or performed during the hospital encounter of 11/17/18 (from the past 24 hour(s))  Urinalysis, Routine w reflex microscopic     Status: Abnormal   Collection Time: 11/17/18  4:10 PM  Result Value Ref Range   Color, Urine YELLOW YELLOW   APPearance HAZY (A) CLEAR   Specific Gravity, Urine 1.030 1.005 - 1.030   pH 7.0 5.0 - 8.0   Glucose, UA NEGATIVE NEGATIVE mg/dL   Hgb urine dipstick NEGATIVE NEGATIVE   Bilirubin Urine NEGATIVE NEGATIVE   Ketones, ur NEGATIVE NEGATIVE mg/dL   Protein, ur 30 (A) NEGATIVE mg/dL   Nitrite NEGATIVE NEGATIVE   Leukocytes,Ua MODERATE (A) NEGATIVE   RBC / HPF 0-5 0 - 5 RBC/hpf   WBC, UA 11-20 0 - 5 WBC/hpf   Bacteria, UA RARE (A) NONE SEEN   Squamous Epithelial / LPF 11-20 0 - 5   Mucus PRESENT   CBC     Status: Abnormal   Collection Time: 11/17/18  5:23 PM  Result Value Ref Range   WBC 10.7 (H) 4.0 - 10.5 K/uL   RBC 3.48 (L) 3.87 - 5.11 MIL/uL   Hemoglobin 10.7 (L) 12.0 - 15.0 g/dL   HCT 47.4 (L) 25.9 - 56.3 %   MCV 93.1 80.0 - 100.0 fL   MCH 30.7 26.0 - 34.0 pg   MCHC 33.0 30.0 - 36.0 g/dL   RDW 87.5 64.3 - 32.9 %    Platelets 209 150 - 400 K/uL   nRBC 0.0 0.0 - 0.2 %     MAU Course  Procedures  MDM -suspect vertigo, checking CBC, urine culture, orthostatic Bps -Hgb10.7, down from 11.7 11days ago, will start on oral iron -orthostatic BPs WNL -awaiting urine culture results -EFM baseline 150, mod variability, + accels, neg decels. TOCO no ctx. -sending  with RX for oral Fe and scopolamine patch -pt to follow-up with primary care for vertigo -discharged to home with significant other, present for entire visit   Assessment and Plan   1. Vertigo   2. [redacted] weeks gestation of pregnancy   3. Anemia affecting pregnancy in second trimester    -discharge to home with significant other -f/u with primary care for official diagnosis and management of vertigo -return MAU precautions/PTL precautions discussed  Allergies as of 11/17/2018      Reactions   No Known Allergies       Medication List    STOP taking these medications   ferrous gluconate 324 MG tablet Commonly known as:  FERGON   hydrocortisone cream 0.5 %     TAKE these medications   Abdominal Binder/Elastic Large Misc 1 Device by Does not apply route daily.   Concept OB 130-92.4-1 MG Caps Take 1 tablet by mouth daily.   famotidine 20 MG tablet Commonly known as:  PEPCID Take 1 tablet (20 mg total) by mouth daily.   ferrous sulfate 325 (65 FE) MG tablet Take 1 tablet (325 mg total) by mouth daily.   scopolamine 1 MG/3DAYS Commonly known as:  Transderm-Scop (1.5 MG) Place 1 patch (1.5 mg total) onto the skin every 3 (three) days for 8 doses.        Odie Sera Courtney [name] 11/17/2018, 6:33 PM

## 2018-11-17 NOTE — MAU Note (Signed)
Courtney [name] is a 28 y.o. at [redacted]w[redacted]d here in MAU reporting: states she has been dizzy since this morning. States she feel like she is spinning. States she has had some soup, chick-fil-a, a coke, and a slurpee today.  Onset of complaint: this morning  Pain score: 0/10  Vitals:   11/17/18 1559  BP: 124/66  Pulse: 98  Resp: 18  Temp: 98 F (36.7 C)  SpO2: 99%      FHT:+ FM  Lab orders placed from triage: UA, pt states she does not need to use restroom at this moment but will try to get [place] a sample

## 2018-11-19 LAB — CULTURE, OB URINE

## 2018-12-10 ENCOUNTER — Ambulatory Visit (INDEPENDENT_AMBULATORY_CARE_PROVIDER_SITE_OTHER): Payer: Medicaid Other | Admitting: Advanced Practice Midwife

## 2018-12-10 ENCOUNTER — Other Ambulatory Visit: Payer: Self-pay

## 2018-12-10 ENCOUNTER — Other Ambulatory Visit: Payer: Medicaid Other

## 2018-12-10 VITALS — BP 125/83 | HR 90 | Wt 191.0 lb

## 2018-12-10 DIAGNOSIS — Z3492 Encounter for supervision of normal pregnancy, unspecified, second trimester: Secondary | ICD-10-CM

## 2018-12-10 DIAGNOSIS — O34219 Maternal care for unspecified type scar from previous cesarean delivery: Secondary | ICD-10-CM

## 2018-12-10 DIAGNOSIS — Z3A27 27 weeks gestation of pregnancy: Secondary | ICD-10-CM

## 2018-12-10 DIAGNOSIS — Z3480 Encounter for supervision of other normal pregnancy, unspecified trimester: Secondary | ICD-10-CM

## 2018-12-10 NOTE — Progress Notes (Signed)
   PRENATAL VISIT NOTE  Subjective:  Courtney [name] is a 28 y.o. [phone] at [redacted]w[redacted]d being seen today for ongoing prenatal care.  She is currently monitored for the following issues for this low-risk pregnancy and has Common migraine with intractable migraine; Missed abortion; Supervision of other normal pregnancy, antepartum; History of cesarean section complicating pregnancy; Short interval between pregnancies complicating pregnancy, antepartum; and Umbilical hernia without obstruction and without gangrene on their problem list.  Patient reports no complaints.  Contractions: Not present. Vag. Bleeding: None.   . Denies leaking of fluid.   The following portions of the patient's history were reviewed and updated as appropriate: allergies, current medications, past family history, past medical history, past social history, past surgical history and problem list.   Objective:   Vitals:   12/10/18 0909  BP: 125/83  Pulse: 90  Weight: 86.6 kg    Fetal Status:           General:  Alert, oriented and cooperative. Patient is in no acute distress.  Skin: Skin is warm and dry. No rash noted.   Cardiovascular: Normal heart rate noted  Respiratory: Normal respiratory effort, no problems with respiration noted  Abdomen: Soft, gravid, appropriate for gestational age.  Pain/Pressure: Absent     Pelvic: Cervical exam deferred        Extremities: Normal range of motion.  Edema: None  Mental Status: Normal mood and affect. Normal behavior. Normal judgment and thought content.   Assessment and Plan:  Pregnancy: [id number] at [redacted]w[redacted]d 1. History of cesarean section complicating pregnancy   2. Supervision of other normal pregnancy, antepartum --Anticipatory guidance about next visits/weeks of pregnancy given. - Enroll Patient in Babyscripts - Glucose Tolerance, 2 Hours w/1 Hour - CBC - HIV Antibody (routine testing w rflx) - RPR  Preterm labor symptoms and general obstetric precautions including  but not limited to vaginal bleeding, contractions, leaking of fluid and fetal movement were reviewed in detail with the patient. Please refer to After Visit Summary for other counseling recommendations.   Return in about 2 weeks (around 12/24/2018).  No future appointments.  Sharen Counter, CNM

## 2018-12-10 NOTE — Patient Instructions (Signed)
Third Trimester of Pregnancy The third trimester is from week 28 through week 40 (months 7 through 9). The third trimester is a time when the unborn baby (fetus) is growing rapidly. At the end of the ninth month, the fetus is about 20 inches in length and weighs 6-10 pounds. Body changes during your third trimester Your body will continue to go through many changes during pregnancy. The changes vary from woman to woman. During the third trimester:  Your weight will continue to increase. You can expect to gain 25-35 pounds (11-16 kg) by the end of the pregnancy.  You may begin to get stretch marks on your hips, abdomen, and breasts.  You may urinate more often because the fetus is moving lower into your pelvis and pressing on your bladder.  You may develop or continue to have heartburn. This is caused by increased hormones that slow down muscles in the digestive tract.  You may develop or continue to have constipation because increased hormones slow digestion and cause the muscles that push waste through your intestines to relax.  You may develop hemorrhoids. These are swollen veins (varicose veins) in the rectum that can itch or be painful.  You may develop swollen, bulging veins (varicose veins) in your legs.  You may have increased body aches in the pelvis, back, or thighs. This is due to weight gain and increased hormones that are relaxing your joints.  You may have changes in your hair. These can include thickening of your hair, rapid growth, and changes in texture. Some women also have hair loss during or after pregnancy, or hair that feels dry or thin. Your hair will most likely return to normal after your baby is born.  Your breasts will continue to grow and they will continue to become tender. A yellow fluid (colostrum) may leak from your breasts. This is the first milk you are producing for your baby.  Your belly button may stick out.  You may notice more swelling in your hands,  face, or ankles.  You may have increased tingling or numbness in your hands, arms, and legs. The skin on your belly may also feel numb.  You may feel short of breath because of your expanding uterus.  You may have more problems sleeping. This can be caused by the size of your belly, increased need to urinate, and an increase in your body's metabolism.  You may notice the fetus "dropping," or moving lower in your abdomen (lightening).  You may have increased vaginal discharge.  You may notice your joints feel loose and you may have pain around your pelvic bone. What to expect at prenatal visits You will have prenatal exams every 2 weeks until week 36. Then you will have weekly prenatal exams. During a routine prenatal visit:  You will be weighed to make sure you and the baby are growing normally.  Your blood pressure will be taken.  Your abdomen will be measured to track your baby's growth.  The fetal heartbeat will be listened to.  Any test results from the previous visit will be discussed.  You may have a cervical check near your due date to see if your cervix has softened or thinned (effaced).  You will be tested for Group B streptococcus. This happens between 35 and 37 weeks. Your health care provider may ask you:  What your birth plan is.  How you are feeling.  If you are feeling the baby move.  If you have had any abnormal   symptoms, such as leaking fluid, bleeding, severe headaches, or abdominal cramping.  If you are using any tobacco products, including cigarettes, chewing tobacco, and electronic cigarettes.  If you have any questions. Other tests or screenings that may be performed during your third trimester include:  Blood tests that check for low iron levels (anemia).  Fetal testing to check the health, activity level, and growth of the fetus. Testing is done if you have certain medical conditions or if there are problems during the pregnancy.  Nonstress test  (NST). This test checks the health of your baby to make sure there are no signs of problems, such as the baby not getting enough oxygen. During this test, a belt is placed around your belly. The baby is made to move, and its heart rate is monitored during movement. What is false labor? False labor is a condition in which you feel small, irregular tightenings of the muscles in the womb (contractions) that usually go away with rest, changing position, or drinking water. These are called Braxton Hicks contractions. Contractions may last for hours, days, or even weeks before true labor sets in. If contractions come at regular intervals, become more frequent, increase in intensity, or become painful, you should see your health care provider. What are the signs of labor?  Abdominal cramps.  Regular contractions that start at 10 minutes apart and become stronger and more frequent with time.  Contractions that start on the top of the uterus and spread down to the lower abdomen and back.  Increased pelvic pressure and dull back pain.  A watery or bloody mucus discharge that comes from the vagina.  Leaking of amniotic fluid. This is also known as your "water breaking." It could be a slow trickle or a gush. Let your health care provider know if it has a color or strange odor. If you have any of these signs, call your health care provider right away, even if it is before your due date. Follow these instructions at home: Medicines  Follow your health care provider's instructions regarding medicine use. Specific medicines may be either safe or unsafe to take during pregnancy.  Take a prenatal vitamin that contains at least 600 micrograms (mcg) of folic acid.  If you develop constipation, try taking a stool softener if your health care provider approves. Eating and drinking   Eat a balanced diet that includes fresh fruits and vegetables, whole grains, good sources of protein such as meat, eggs, or tofu,  and low-fat dairy. Your health care provider will help you determine the amount of weight gain that is right for you.  Avoid raw meat and uncooked cheese. These carry germs that can cause birth defects in the baby.  If you have low calcium intake from food, talk to your health care provider about whether you should take a daily calcium supplement.  Eat four or five small meals rather than three large meals a day.  Limit foods that are high in fat and processed sugars, such as fried and sweet foods.  To prevent constipation: ? Drink enough fluid to keep your urine clear or pale yellow. ? Eat foods that are high in fiber, such as fresh fruits and vegetables, whole grains, and beans. Activity  Exercise only as directed by your health care provider. Most women can continue their usual exercise routine during pregnancy. Try to exercise for 30 minutes at least 5 days a week. Stop exercising if you experience uterine contractions.  Avoid heavy lifting.  Do   not exercise in extreme heat or humidity, or at high altitudes.  Wear low-heel, comfortable shoes.  Practice good posture.  You may continue to have sex unless your health care provider tells you otherwise. Relieving pain and discomfort  Take frequent breaks and rest with your legs elevated if you have leg cramps or low back pain.  Take warm sitz baths to soothe any pain or discomfort caused by hemorrhoids. Use hemorrhoid cream if your health care provider approves.  Wear a good support bra to prevent discomfort from breast tenderness.  If you develop varicose veins: ? Wear support pantyhose or compression stockings as told by your healthcare provider. ? Elevate your feet for 15 minutes, 3-4 times a day. Prenatal care  Write down your questions. Take them to your prenatal visits.  Keep all your prenatal visits as told by your health care provider. This is important. Safety  Wear your seat belt at all times when driving.  Make  a list of emergency phone numbers, including numbers for family, friends, the hospital, and police and fire departments. General instructions  Avoid cat litter boxes and soil used by cats. These carry germs that can cause birth defects in the baby. If you have a cat, ask someone to clean the litter box for you.  Do not travel far distances unless it is absolutely necessary and only with the approval of your health care provider.  Do not use hot tubs, steam rooms, or saunas.  Do not drink alcohol.  Do not use any products that contain nicotine or tobacco, such as cigarettes and e-cigarettes. If you need help quitting, ask your health care provider.  Do not use any medicinal herbs or unprescribed drugs. These chemicals affect the formation and growth of the baby.  Do not douche or use tampons or scented sanitary pads.  Do not cross your legs for long periods of time.  To prepare for the arrival of your baby: ? Take prenatal classes to understand, practice, and ask questions about labor and delivery. ? Make a trial run to the hospital. ? Visit the hospital and tour the maternity area. ? Arrange for maternity or paternity leave through employers. ? Arrange for family and friends to take care of pets while you are in the hospital. ? Purchase a rear-facing car seat and make sure you know how to install it in your car. ? Pack your hospital bag. ? Prepare the baby's nursery. Make sure to remove all pillows and stuffed animals from the baby's crib to prevent suffocation.  Visit your dentist if you have not gone during your pregnancy. Use a soft toothbrush to brush your teeth and be gentle when you floss. Contact a health care provider if:  You are unsure if you are in labor or if your water has broken.  You become dizzy.  You have mild pelvic cramps, pelvic pressure, or nagging pain in your abdominal area.  You have lower back pain.  You have persistent nausea, vomiting, or  diarrhea.  You have an unusual or bad smelling vaginal discharge.  You have pain when you urinate. Get help right away if:  Your water breaks before 37 weeks.  You have regular contractions less than 5 minutes apart before 37 weeks.  You have a fever.  You are leaking fluid from your vagina.  You have spotting or bleeding from your vagina.  You have severe abdominal pain or cramping.  You have rapid weight loss or weight gain.  You have   shortness of breath with chest pain.  You notice sudden or extreme swelling of your face, hands, ankles, feet, or legs.  Your baby makes fewer than 10 movements in 2 hours.  You have severe headaches that do not go away when you take medicine.  You have vision changes. Summary  The third trimester is from week 28 through week 40, months 7 through 9. The third trimester is a time when the unborn baby (fetus) is growing rapidly.  During the third trimester, your discomfort may increase as you and your baby continue to gain weight. You may have abdominal, leg, and back pain, sleeping problems, and an increased need to urinate.  During the third trimester your breasts will keep growing and they will continue to become tender. A yellow fluid (colostrum) may leak from your breasts. This is the first milk you are producing for your baby.  False labor is a condition in which you feel small, irregular tightenings of the muscles in the womb (contractions) that eventually go away. These are called Braxton Hicks contractions. Contractions may last for hours, days, or even weeks before true labor sets in.  Signs of labor can include: abdominal cramps; regular contractions that start at 10 minutes apart and become stronger and more frequent with time; watery or bloody mucus discharge that comes from the vagina; increased pelvic pressure and dull back pain; and leaking of amniotic fluid. This information is not intended to replace advice given to you by your  health care provider. Make sure you discuss any questions you have with your health care provider. Document Released: 08/23/2001 Document Revised: 10/04/2016 Document Reviewed: 10/04/2016 Elsevier Interactive Patient Education  2019 [employer]  

## 2018-12-10 NOTE — Progress Notes (Signed)
Pt needs B/P Cuff for Baby scripts.

## 2018-12-11 LAB — CBC
HEMATOCRIT: 31.9 % — AB (ref 34.0–46.6)
Hemoglobin: 10.6 g/dL — ABNORMAL LOW (ref 11.1–15.9)
MCH: 30.8 pg (ref 26.6–33.0)
MCHC: 33.2 g/dL (ref 31.5–35.7)
MCV: 93 fL (ref 79–97)
Platelets: 171 10*3/uL (ref 150–450)
RBC: 3.44 x10E6/uL — ABNORMAL LOW (ref 3.77–5.28)
RDW: 12.9 % (ref 11.7–15.4)
WBC: 9.8 10*3/uL (ref 3.4–10.8)

## 2018-12-11 LAB — HIV ANTIBODY (ROUTINE TESTING W REFLEX): HIV Screen 4th Generation wRfx: NONREACTIVE

## 2018-12-11 LAB — RPR: RPR Ser Ql: NONREACTIVE

## 2018-12-11 LAB — GLUCOSE TOLERANCE, 2 HOURS W/ 1HR
Glucose, 1 hour: 166 mg/dL (ref 65–179)
Glucose, 2 hour: 127 mg/dL (ref 65–152)
Glucose, Fasting: 84 mg/dL (ref 65–91)

## 2018-12-11 NOTE — Addendum Note (Signed)
Addended by: Kennon Portela on: 12/11/2018 09:10 AM   Modules accepted: Orders

## 2019-01-07 ENCOUNTER — Encounter: Payer: Medicaid Other | Admitting: Advanced Practice Midwife

## 2019-01-08 ENCOUNTER — Other Ambulatory Visit: Payer: Self-pay

## 2019-01-08 ENCOUNTER — Ambulatory Visit (INDEPENDENT_AMBULATORY_CARE_PROVIDER_SITE_OTHER): Payer: Medicaid Other | Admitting: Advanced Practice Midwife

## 2019-01-08 ENCOUNTER — Encounter: Payer: Self-pay | Admitting: Advanced Practice Midwife

## 2019-01-08 DIAGNOSIS — O34219 Maternal care for unspecified type scar from previous cesarean delivery: Secondary | ICD-10-CM

## 2019-01-08 DIAGNOSIS — Z3480 Encounter for supervision of other normal pregnancy, unspecified trimester: Secondary | ICD-10-CM

## 2019-01-08 DIAGNOSIS — Z3A31 31 weeks gestation of pregnancy: Secondary | ICD-10-CM

## 2019-01-08 DIAGNOSIS — Z3483 Encounter for supervision of other normal pregnancy, third trimester: Secondary | ICD-10-CM

## 2019-01-08 NOTE — Progress Notes (Signed)
Webex ROB: No complaints per pt. Pt has a cuff but does not have a battery for it.

## 2019-01-08 NOTE — Progress Notes (Signed)
   PRENATAL VISIT NOTE TELEHEALTH VIRTUAL OBSTETRICS VISIT ENCOUNTER NOTE  I connected with@ on 01/08/19 at  9:35 AM EDT by Webex at home and verified that I am speaking with the correct person using two identifiers.   I discussed the limitations, risks, security and privacy concerns of performing an evaluation and management service by telephone and the availability of in person appointments. I also discussed with the patient that there may be a patient responsible charge related to this service. The patient expressed understanding and agreed to proceed. Subjective:  Courtney [name] is a 28 y.o. [phone] at [redacted]w[redacted]d being seen today for ongoing prenatal care.  She is currently monitored for the following issues for this low-risk pregnancy and has Common migraine with intractable migraine; Missed abortion; Supervision of other normal pregnancy, antepartum; History of cesarean section complicating pregnancy; Short interval between pregnancies complicating pregnancy, antepartum; and Umbilical hernia without obstruction and without gangrene on their problem list.  Patient reports no complaints.  Reports fetal movement. Contractions: Irritability. Vag. Bleeding: None.  Movement: Present. Denies any contractions, bleeding or leaking of fluid.   The following portions of the patient's history were reviewed and updated as appropriate: allergies, current medications, past family history, past medical history, past social history, past surgical history and problem list.   Objective:  There were no vitals filed for this visit.  Fetal Status:     Movement: Present     General:  Alert, oriented and cooperative. Patient is in no acute distress.  Respiratory: Normal respiratory effort, no problems with respiration noted  Mental Status: Normal mood and affect. Normal behavior. Normal judgment and thought content.  Rest of physical exam deferred due to type of encounter  Assessment and Plan:  Pregnancy:  [id number] at [redacted]w[redacted]d 1. Supervision of other normal pregnancy, antepartum --Pt reports good fetal movement, denies cramping, LOF, or frequent cramping --Pt has BP cuff but it needs batteries.  Has Babyscripts app.  Pt to take BP this week and record.  --Next visit in 2 weeks --PEC s/sx reviewed  2. History of cesarean section complicating pregnancy --Desires VBAC, needs to sign consent at 36 week visit  Preterm labor symptoms and general obstetric precautions including but not limited to vaginal bleeding, contractions, leaking of fluid and fetal movement were reviewed in detail with the patient. I discussed the assessment and treatment plan with the patient. The patient was provided an opportunity to ask questions and all were answered. The patient agreed with the plan and demonstrated an understanding of the instructions. The patient was advised to call back or seek an in-person office evaluation/go to MAU at [facility] for any urgent or concerning symptoms.  Please refer to After Visit Summary for other counseling recommendations.  I provided 9 minutes of non-face-to-face time during this encounter. No follow-ups on file.  No future appointments.  Sharen Counter, CNM

## 2019-01-21 ENCOUNTER — Other Ambulatory Visit: Payer: Self-pay

## 2019-01-21 ENCOUNTER — Ambulatory Visit (INDEPENDENT_AMBULATORY_CARE_PROVIDER_SITE_OTHER): Payer: Medicaid Other | Admitting: Advanced Practice Midwife

## 2019-01-21 ENCOUNTER — Encounter: Payer: Self-pay | Admitting: Advanced Practice Midwife

## 2019-01-21 DIAGNOSIS — O34219 Maternal care for unspecified type scar from previous cesarean delivery: Secondary | ICD-10-CM | POA: Diagnosis not present

## 2019-01-21 DIAGNOSIS — Z3A33 33 weeks gestation of pregnancy: Secondary | ICD-10-CM

## 2019-01-21 DIAGNOSIS — Z3480 Encounter for supervision of other normal pregnancy, unspecified trimester: Secondary | ICD-10-CM

## 2019-01-21 DIAGNOSIS — Z3483 Encounter for supervision of other normal pregnancy, third trimester: Secondary | ICD-10-CM

## 2019-01-21 NOTE — Progress Notes (Signed)
   TELEHEALTH VIRTUAL OBSTETRICS PRENATAL VISIT ENCOUNTER NOTE  I connected with Courtney [name] on 01/21/19 at  8:15 AM EDT by WebEx at home and verified that I am speaking with the correct person using two identifiers.   I discussed the limitations, risks, security and privacy concerns of performing an evaluation and management service by telephone and the availability of in person appointments. I also discussed with the patient that there may be a patient responsible charge related to this service. The patient expressed understanding and agreed to proceed. Subjective:  Courtney [name] is a 28 y.o. [phone] at [redacted]w[redacted]d being seen today for ongoing prenatal care.  She is currently monitored for the following issues for this low-risk pregnancy and has Common migraine with intractable migraine; Missed abortion; Supervision of other normal pregnancy, antepartum; History of cesarean section complicating pregnancy; Short interval between pregnancies complicating pregnancy, antepartum; and Umbilical hernia without obstruction and without gangrene on their problem list.  Patient reports no complaints.  Reports fetal movement. Contractions: Irritability. Vag. Bleeding: None.  Movement: Present. Denies any contractions, bleeding or leaking of fluid.   The following portions of the patient's history were reviewed and updated as appropriate: allergies, current medications, past family history, past medical history, past social history, past surgical history and problem list.   Objective:  There were no vitals filed for this visit.  Fetal Status:     Movement: Present     General:  Alert, oriented and cooperative. Patient is in no acute distress.  Respiratory: Normal respiratory effort, no problems with respiration noted  Mental Status: Normal mood and affect. Normal behavior. Normal judgment and thought content.  Rest of physical exam deferred due to type of encounter  Assessment and Plan:   Pregnancy: [id number] at [redacted]w[redacted]d 1. History of cesarean section complicating pregnancy --NSVD x 1, C/S in 2019 for fetal indications, desires VBAC --Will do consent at 36 week visit  2. Supervision of other normal pregnancy, antepartum --Pt reports good fetal movement, denies cramping, LOF, or frequent cramping --Pt has BP cuff but is not at home to take BP this morning, will take and put in Babyscripts today. CNM to check with pt before end of business day. --Anticipatory guidance about next visits/weeks of pregnancy given. --Reviewed safety, visitor policy, reassurance about COVID-19 for pregnancy at this time. Discussed possible changes to visits, including televisits, that may occur due to COVID-19.  The office remains open if pt needs to be seen and MAU is open 24 hours/day for OB emergencies.    - Enroll Patient in Babyscripts - Babyscripts Schedule Optimization  Preterm labor symptoms and general obstetric precautions including but not limited to vaginal bleeding, contractions, leaking of fluid and fetal movement were reviewed in detail with the patient. I discussed the assessment and treatment plan with the patient. The patient was provided an opportunity to ask questions and all were answered. The patient agreed with the plan and demonstrated an understanding of the instructions. The patient was advised to call back or seek an in-person office evaluation/go to MAU at [facility] for any urgent or concerning symptoms. Please refer to After Visit Summary for other counseling recommendations.   I provided 8 minutes of face-to-face via WebEx time during this encounter.  Return in about 3 weeks (around 02/11/2019).  No future appointments.  Sharen Counter, CNM [facility] [employer], [facility]

## 2019-01-21 NOTE — Progress Notes (Signed)
ROB   CC: None    

## 2019-02-02 ENCOUNTER — Inpatient Hospital Stay (HOSPITAL_COMMUNITY)
Admission: AD | Admit: 2019-02-02 | Discharge: 2019-02-02 | Disposition: A | Discharge status: Home / Self Care | Payer: Medicaid Other | Attending: Obstetrics & Gynecology | Admitting: Obstetrics & Gynecology

## 2019-02-02 ENCOUNTER — Encounter (HOSPITAL_COMMUNITY): Payer: Self-pay | Admitting: *Deleted

## 2019-02-02 ENCOUNTER — Other Ambulatory Visit: Payer: Self-pay

## 2019-02-02 DIAGNOSIS — N898 Other specified noninflammatory disorders of vagina: Secondary | ICD-10-CM | POA: Diagnosis not present

## 2019-02-02 DIAGNOSIS — M419 Scoliosis, unspecified: Secondary | ICD-10-CM | POA: Insufficient documentation

## 2019-02-02 DIAGNOSIS — O26893 Other specified pregnancy related conditions, third trimester: Secondary | ICD-10-CM | POA: Diagnosis not present

## 2019-02-02 DIAGNOSIS — O2693 Pregnancy related conditions, unspecified, third trimester: Secondary | ICD-10-CM | POA: Insufficient documentation

## 2019-02-02 DIAGNOSIS — O4703 False labor before 37 completed weeks of gestation, third trimester: Secondary | ICD-10-CM | POA: Diagnosis not present

## 2019-02-02 DIAGNOSIS — Z3A34 34 weeks gestation of pregnancy: Secondary | ICD-10-CM | POA: Diagnosis not present

## 2019-02-02 LAB — URINALYSIS, ROUTINE W REFLEX MICROSCOPIC
Bilirubin Urine: NEGATIVE
Glucose, UA: 50 mg/dL — AB
Ketones, ur: NEGATIVE mg/dL
Nitrite: NEGATIVE
Protein, ur: NEGATIVE mg/dL
Specific Gravity, Urine: 1.026 (ref 1.005–1.030)
pH: 5 (ref 5.0–8.0)

## 2019-02-02 NOTE — Discharge Instructions (Signed)
Fetal Movement Counts Patient Name: ________________________________________________ Patient Due Date: ____________________ What is a fetal movement count?  A fetal movement count is the number of times that you feel your baby move during a certain amount of time. This may also be called a fetal kick count. A fetal movement count is recommended for every pregnant woman. You may be asked to start counting fetal movements as early as week 28 of your pregnancy. Pay attention to when your baby is most active. You may notice your baby's sleep and wake cycles. You may also notice things that make your baby move more. You should do a fetal movement count:  When your baby is normally most active.  At the same time each day. A good time to count movements is while you are resting, after having something to eat and drink. How do I count fetal movements? 1. Find a quiet, comfortable area. Sit, or lie down on your side. 2. Write down the date, the start time and stop time, and the number of movements that you felt between those two times. Take this information with you to your health care visits. 3. For 2 hours, count kicks, flutters, swishes, rolls, and jabs. You should feel at least 10 movements during 2 hours. 4. You may stop counting after you have felt 10 movements. 5. If you do not feel 10 movements in 2 hours, have something to eat and drink. Then, keep resting and counting for 1 hour. If you feel at least 4 movements during that hour, you may stop counting. Contact a health care provider if:  You feel fewer than 4 movements in 2 hours.  Your baby is not moving like he or she usually does. Date: ____________ Start time: ____________ Stop time: ____________ Movements: ____________ Date: ____________ Start time: ____________ Stop time: ____________ Movements: ____________ Date: ____________ Start time: ____________ Stop time: ____________ Movements: ____________ Date: ____________ Start time:  ____________ Stop time: ____________ Movements: ____________ Date: ____________ Start time: ____________ Stop time: ____________ Movements: ____________ Date: ____________ Start time: ____________ Stop time: ____________ Movements: ____________ Date: ____________ Start time: ____________ Stop time: ____________ Movements: ____________ Date: ____________ Start time: ____________ Stop time: ____________ Movements: ____________ Date: ____________ Start time: ____________ Stop time: ____________ Movements: ____________ This information is not intended to replace advice given to you by your health care provider. Make sure you discuss any questions you have with your health care provider. Document Released: 09/28/2006 Document Revised: 04/27/2016 Document Reviewed: 10/08/2015 Elsevier Interactive Patient Education  2019 Elsevier [employer]. Braxton Hicks Contractions Contractions of the uterus can occur throughout pregnancy, but they are not always a sign that you are in labor. You may have practice contractions called Braxton Hicks contractions. These false labor contractions are sometimes confused with true labor. What are Braxton Hicks contractions? Braxton Hicks contractions are tightening movements that occur in the muscles of the uterus before labor. Unlike true labor contractions, these contractions do not result in opening (dilation) and thinning of the cervix. Toward the end of pregnancy (32-34 weeks), Braxton Hicks contractions can happen more often and may become stronger. These contractions are sometimes difficult to tell apart from true labor because they can be very uncomfortable. You should not feel embarrassed if you go to the hospital with false labor. Sometimes, the only way to tell if you are in true labor is for your health care provider to look for changes in the cervix. The health care provider will do a physical exam and may monitor your contractions. If   you are not in true labor, the exam  should show that your cervix is not dilating and your water has not broken. If there are no other health problems associated with your pregnancy, it is completely safe for you to be sent home with false labor. You may continue to have Braxton Hicks contractions until you go into true labor. How to tell the difference between true labor and false labor True labor  Contractions last 30-70 seconds.  Contractions become very regular.  Discomfort is usually felt in the top of the uterus, and it spreads to the lower abdomen and low back.  Contractions do not go away with walking.  Contractions usually become more intense and increase in frequency.  The cervix dilates and gets thinner. False labor  Contractions are usually shorter and not as strong as true labor contractions.  Contractions are usually irregular.  Contractions are often felt in the front of the lower abdomen and in the groin.  Contractions may go away when you walk around or change positions while lying down.  Contractions get weaker and are shorter-lasting as time goes on.  The cervix usually does not dilate or become thin. Follow these instructions at home:   Take over-the-counter and prescription medicines only as told by your health care provider.  Keep up with your usual exercises and follow other instructions from your health care provider.  Eat and drink lightly if you think you are going into labor.  If Braxton Hicks contractions are making you uncomfortable: ? Change your position from lying down or resting to walking, or change from walking to resting. ? Sit and rest in a tub of warm water. ? Drink enough fluid to keep your urine pale yellow. Dehydration may cause these contractions. ? Do slow and deep breathing several times an hour.  Keep all follow-up prenatal visits as told by your health care provider. This is important. Contact a health care provider if:  You have a fever.  You have continuous  pain in your abdomen. Get help right away if:  Your contractions become stronger, more regular, and closer together.  You have fluid leaking or gushing from your vagina.  You pass blood-tinged mucus (bloody show).  You have bleeding from your vagina.  You have low back pain that you never had before.  You feel your baby's head pushing down and causing pelvic pressure.  Your baby is not moving inside you as much as it used to. Summary  Contractions that occur before labor are called Braxton Hicks contractions, false labor, or practice contractions.  Braxton Hicks contractions are usually shorter, weaker, farther apart, and less regular than true labor contractions. True labor contractions usually become progressively stronger and regular, and they become more frequent.  Manage discomfort from Braxton Hicks contractions by changing position, resting in a warm bath, drinking plenty of water, or practicing deep breathing. This information is not intended to replace advice given to you by your health care provider. Make sure you discuss any questions you have with your health care provider. Document Released: 01/12/2017 Document Revised: 06/13/2017 Document Reviewed: 01/12/2017 Elsevier Interactive Patient Education  2019 Elsevier Inc.  

## 2019-02-02 NOTE — MAU Provider Note (Signed)
History     CSN: [id number]  Arrival date and time: 02/02/19 3009   First Provider Initiated Contact with Patient 02/02/19 1916      Chief Complaint  Patient presents with  . Contractions  . Vaginal Discharge   HPI [name] is a 28 y.o. [phone] at [redacted]w[redacted]d who presents to MAU today with complaint of mucous vaginal discharge since earlier today. She states that from midnight to 2 am she had regular contractions which resolved spontaneously. She had called the nurse line and was told to come in then, but did not come until now. She denies contractions now. She denies vaginal bleeding or LOF. She reports normal fetal movement.   OB History    Gravida  4   Para  2   Term  2   Preterm      AB  1   Living  2     SAB  1   TAB      Ectopic      Multiple      Live Births  2           Past Medical History:  Diagnosis Date  . Allergic rhinitis   . Back pain   . Common migraine with intractable migraine 12/16/2014   migraines PRN meds  . Depression    denies  . Eczema   . GERD (gastroesophageal reflux disease)    takes meds PRN   . Infection    UTI  . Scoliosis   . Vitamin D deficiency     Past Surgical History:  Procedure Laterality Date  . CESAREAN SECTION N/A 01/17/2018   Procedure: CESAREAN SECTION;  Surgeon: Philip Aspen, DO;  Location: [facility] BIRTHING SUITES;  Service: Obstetrics;  Laterality: N/A;  . DILATION AND EVACUATION N/A 05/06/2015   Procedure: DILATATION AND EVACUATION;  Surgeon: Edwinna Areola, DO;  Location: [facility] ORS;  Service: Gynecology;  Laterality: N/A;    Family History  Problem Relation Age of Onset  . Migraines Mother   . Asthma Mother   . Healthy Sister   . Healthy Brother   . Healthy Brother   . Healthy Brother   . Cancer Maternal Grandmother   . Cancer Paternal Grandfather     Social History   Tobacco Use  . Smoking status: Never Smoker  . Smokeless tobacco: Never Used  Substance Use Topics  . Alcohol  use: No    Comment: occasionally  . Drug use: No    Allergies:  Allergies  Allergen Reactions  . No Known Allergies     Medications Prior to Admission  Medication Sig Dispense Refill Last Dose  . Elastic Bandages & Supports (ABDOMINAL BINDER/ELASTIC LARGE) MISC 1 Device by Does not apply route daily. (Patient not taking: Reported on 11/12/2018) 1 each 0 Not Taking  . famotidine (PEPCID) 20 MG tablet Take 1 tablet (20 mg total) by mouth daily. (Patient not taking: Reported on 01/08/2019) 30 tablet 1 Not Taking  . ferrous sulfate 325 (65 FE) MG tablet Take 1 tablet (325 mg total) by mouth daily. (Patient not taking: Reported on 01/08/2019) 30 tablet 1 Not Taking  . Prenat w/o A Vit-FeFum-FePo-FA (CONCEPT OB) 130-92.4-1 MG CAPS Take 1 tablet by mouth daily. 30 capsule 12 Taking    Review of Systems  Constitutional: Negative for fever.  Gastrointestinal: Negative for abdominal pain, constipation, diarrhea, nausea and vomiting.  Genitourinary: Positive for vaginal discharge. Negative for vaginal bleeding.   Physical Exam   Blood  pressure 117/60, pulse 94, temperature 98.3 F (36.8 C), temperature source Oral, resp. rate 18, height 5\' 3"  (1.6 m), weight 91.5 kg, last menstrual period 06/03/2018, SpO2 99 %, unknown if currently breastfeeding.  Physical Exam  Nursing note and vitals reviewed. Constitutional: She is oriented to person, place, and time. She appears well-developed and well-nourished. No distress.  HENT:  Head: Normocephalic and atraumatic.  Cardiovascular: Normal rate.  Respiratory: Effort normal.  GI: Soft. She exhibits no distension and no mass. There is no abdominal tenderness. There is no rebound and no guarding.  Neurological: She is alert and oriented to person, place, and time.  Skin: Skin is warm and dry. No erythema.  Psychiatric: She has a normal mood and affect.  Dilation: 3.5 Effacement (%): Thick Cervical Position: Posterior Station: Ballotable Exam by::  Vonzella NippleJulie , PA   Results for orders placed or performed during the hospital encounter of 02/02/19 (from the past 24 hour(s))  Urinalysis, Routine w reflex microscopic     Status: Abnormal   Collection Time: 02/02/19  8:03 PM  Result Value Ref Range   Color, Urine YELLOW YELLOW   APPearance HAZY (A) CLEAR   Specific Gravity, Urine 1.026 1.005 - 1.030   pH 5.0 5.0 - 8.0   Glucose, UA 50 (A) NEGATIVE mg/dL   Hgb urine dipstick SMALL (A) NEGATIVE   Bilirubin Urine NEGATIVE NEGATIVE   Ketones, ur NEGATIVE NEGATIVE mg/dL   Protein, ur NEGATIVE NEGATIVE mg/dL   Nitrite NEGATIVE NEGATIVE   Leukocytes,Ua MODERATE (A) NEGATIVE   RBC / HPF 0-5 0 - 5 RBC/hpf   WBC, UA 21-50 0 - 5 WBC/hpf   Bacteria, UA RARE (A) NONE SEEN   Squamous Epithelial / LPF 11-20 0 - 5   Mucus PRESENT    Ca Oxalate Crys, UA PRESENT     Fetal Monitoring: Baseline: 150 bpm  Variability: moderate Accelerations: 15 x 15 Decelerations: none Contractions: few, irregular   MAU Course  Procedures None  MDM Minimal contractions noted. Non-palpable per patient.  Discharge consistent with normal mucous. No sample taken.  Cervix recheck unchanged.  Assessment and Plan  A: SIUP at 6933w6d Mucous discharge Braxton hicks contractions   P: Discharge home Preterm labor precautions discussed Patient advised to follow-up with [facility] as scheduled or sooner PRN Patient may return to MAU as needed or if her condition were to change or worsen  Vonzella NippleJulie , PA-C 02/02/2019, 8:59 PM

## 2019-02-02 NOTE — Progress Notes (Signed)
I have communicated with Vonzella Nipple, PA and reviewed vital signs:  Vitals:   02/02/19 1912 02/02/19 2047  BP: 123/66 117/60  Pulse: (!) 104 94  Resp:    Temp:    SpO2:      Vaginal exam:  Dilation: 3.5 Effacement (%): Thick Cervical Position: Posterior Station: Ballotable Exam by:: Vonzella Nipple, PA,   Also reviewed contraction pattern and that non-stress test is reactive.  It has been documented that patient is contracting irregularly with no cervical change over 1 hour not indicating active labor.  Patient denies any other complaints.  Based on this report provider has given order for discharge.  A discharge order and diagnosis entered by a provider.   Labor discharge instructions reviewed with patient.

## 2019-02-02 NOTE — MAU Note (Signed)
Pt reports having ctx between midnight and 0200 this am every 2-4 min.  Pt reports ctx stopped and is not currently having any pain.  Pt also reports some increased dc that has a foul odor.  Pt denies LOF or vag bleeding.  Reports good fetal movement.

## 2019-02-11 ENCOUNTER — Other Ambulatory Visit (HOSPITAL_COMMUNITY)
Admission: RE | Admit: 2019-02-11 | Discharge: 2019-02-11 | Disposition: A | Discharge status: Home / Self Care | Payer: Medicaid Other | Source: Ambulatory Visit | Attending: Advanced Practice Midwife | Admitting: Advanced Practice Midwife

## 2019-02-11 ENCOUNTER — Other Ambulatory Visit: Payer: Self-pay

## 2019-02-11 ENCOUNTER — Ambulatory Visit (INDEPENDENT_AMBULATORY_CARE_PROVIDER_SITE_OTHER): Payer: Medicaid Other | Admitting: Advanced Practice Midwife

## 2019-02-11 VITALS — BP 123/80 | HR 105 | Wt 203.2 lb

## 2019-02-11 DIAGNOSIS — Z3A36 36 weeks gestation of pregnancy: Secondary | ICD-10-CM

## 2019-02-11 DIAGNOSIS — O34219 Maternal care for unspecified type scar from previous cesarean delivery: Secondary | ICD-10-CM

## 2019-02-11 DIAGNOSIS — Z3480 Encounter for supervision of other normal pregnancy, unspecified trimester: Secondary | ICD-10-CM | POA: Diagnosis not present

## 2019-02-11 DIAGNOSIS — N898 Other specified noninflammatory disorders of vagina: Secondary | ICD-10-CM

## 2019-02-11 DIAGNOSIS — O26893 Other specified pregnancy related conditions, third trimester: Secondary | ICD-10-CM

## 2019-02-11 NOTE — Progress Notes (Signed)
Pt is here for Rob [redacted]w[redacted]d.

## 2019-02-11 NOTE — Patient Instructions (Signed)
Third Trimester of Pregnancy The third trimester is from week 28 through week 40 (months 7 through 9). The third trimester is a time when the unborn baby (fetus) is growing rapidly. At the end of the ninth month, the fetus is about 20 inches in length and weighs 6-10 pounds. Body changes during your third trimester Your body will continue to go through many changes during pregnancy. The changes vary from woman to woman. During the third trimester:  Your weight will continue to increase. You can expect to gain 25-35 pounds (11-16 kg) by the end of the pregnancy.  You may begin to get stretch marks on your hips, abdomen, and breasts.  You may urinate more often because the fetus is moving lower into your pelvis and pressing on your bladder.  You may develop or continue to have heartburn. This is caused by increased hormones that slow down muscles in the digestive tract.  You may develop or continue to have constipation because increased hormones slow digestion and cause the muscles that push waste through your intestines to relax.  You may develop hemorrhoids. These are swollen veins (varicose veins) in the rectum that can itch or be painful.  You may develop swollen, bulging veins (varicose veins) in your legs.  You may have increased body aches in the pelvis, back, or thighs. This is due to weight gain and increased hormones that are relaxing your joints.  You may have changes in your hair. These can include thickening of your hair, rapid growth, and changes in texture. Some women also have hair loss during or after pregnancy, or hair that feels dry or thin. Your hair will most likely return to normal after your baby is born.  Your breasts will continue to grow and they will continue to become tender. A yellow fluid (colostrum) may leak from your breasts. This is the first milk you are producing for your baby.  Your belly button may stick out.  You may notice more swelling in your hands,  face, or ankles.  You may have increased tingling or numbness in your hands, arms, and legs. The skin on your belly may also feel numb.  You may feel short of breath because of your expanding uterus.  You may have more problems sleeping. This can be caused by the size of your belly, increased need to urinate, and an increase in your body's metabolism.  You may notice the fetus "dropping," or moving lower in your abdomen (lightening).  You may have increased vaginal discharge.  You may notice your joints feel loose and you may have pain around your pelvic bone. What to expect at prenatal visits You will have prenatal exams every 2 weeks until week 36. Then you will have weekly prenatal exams. During a routine prenatal visit:  You will be weighed to make sure you and the baby are growing normally.  Your blood pressure will be taken.  Your abdomen will be measured to track your baby's growth.  The fetal heartbeat will be listened to.  Any test results from the previous visit will be discussed.  You may have a cervical check near your due date to see if your cervix has softened or thinned (effaced).  You will be tested for Group B streptococcus. This happens between 35 and 37 weeks. Your health care provider may ask you:  What your birth plan is.  How you are feeling.  If you are feeling the baby move.  If you have had any abnormal   symptoms, such as leaking fluid, bleeding, severe headaches, or abdominal cramping.  If you are using any tobacco products, including cigarettes, chewing tobacco, and electronic cigarettes.  If you have any questions. Other tests or screenings that may be performed during your third trimester include:  Blood tests that check for low iron levels (anemia).  Fetal testing to check the health, activity level, and growth of the fetus. Testing is done if you have certain medical conditions or if there are problems during the pregnancy.  Nonstress test  (NST). This test checks the health of your baby to make sure there are no signs of problems, such as the baby not getting enough oxygen. During this test, a belt is placed around your belly. The baby is made to move, and its heart rate is monitored during movement. What is false labor? False labor is a condition in which you feel small, irregular tightenings of the muscles in the womb (contractions) that usually go away with rest, changing position, or drinking water. These are called Braxton Hicks contractions. Contractions may last for hours, days, or even weeks before true labor sets in. If contractions come at regular intervals, become more frequent, increase in intensity, or become painful, you should see your health care provider. What are the signs of labor?  Abdominal cramps.  Regular contractions that start at 10 minutes apart and become stronger and more frequent with time.  Contractions that start on the top of the uterus and spread down to the lower abdomen and back.  Increased pelvic pressure and dull back pain.  A watery or bloody mucus discharge that comes from the vagina.  Leaking of amniotic fluid. This is also known as your "water breaking." It could be a slow trickle or a gush. Let your health care provider know if it has a color or strange odor. If you have any of these signs, call your health care provider right away, even if it is before your due date. Follow these instructions at home: Medicines  Follow your health care provider's instructions regarding medicine use. Specific medicines may be either safe or unsafe to take during pregnancy.  Take a prenatal vitamin that contains at least 600 micrograms (mcg) of folic acid.  If you develop constipation, try taking a stool softener if your health care provider approves. Eating and drinking   Eat a balanced diet that includes fresh fruits and vegetables, whole grains, good sources of protein such as meat, eggs, or tofu,  and low-fat dairy. Your health care provider will help you determine the amount of weight gain that is right for you.  Avoid raw meat and uncooked cheese. These carry germs that can cause birth defects in the baby.  If you have low calcium intake from food, talk to your health care provider about whether you should take a daily calcium supplement.  Eat four or five small meals rather than three large meals a day.  Limit foods that are high in fat and processed sugars, such as fried and sweet foods.  To prevent constipation: ? Drink enough fluid to keep your urine clear or pale yellow. ? Eat foods that are high in fiber, such as fresh fruits and vegetables, whole grains, and beans. Activity  Exercise only as directed by your health care provider. Most women can continue their usual exercise routine during pregnancy. Try to exercise for 30 minutes at least 5 days a week. Stop exercising if you experience uterine contractions.  Avoid heavy lifting.  Do   not exercise in extreme heat or humidity, or at high altitudes.  Wear low-heel, comfortable shoes.  Practice good posture.  You may continue to have sex unless your health care provider tells you otherwise. Relieving pain and discomfort  Take frequent breaks and rest with your legs elevated if you have leg cramps or low back pain.  Take warm sitz baths to soothe any pain or discomfort caused by hemorrhoids. Use hemorrhoid cream if your health care provider approves.  Wear a good support bra to prevent discomfort from breast tenderness.  If you develop varicose veins: ? Wear support pantyhose or compression stockings as told by your healthcare provider. ? Elevate your feet for 15 minutes, 3-4 times a day. Prenatal care  Write down your questions. Take them to your prenatal visits.  Keep all your prenatal visits as told by your health care provider. This is important. Safety  Wear your seat belt at all times when driving.  Make  a list of emergency phone numbers, including numbers for family, friends, the hospital, and police and fire departments. General instructions  Avoid cat litter boxes and soil used by cats. These carry germs that can cause birth defects in the baby. If you have a cat, ask someone to clean the litter box for you.  Do not travel far distances unless it is absolutely necessary and only with the approval of your health care provider.  Do not use hot tubs, steam rooms, or saunas.  Do not drink alcohol.  Do not use any products that contain nicotine or tobacco, such as cigarettes and e-cigarettes. If you need help quitting, ask your health care provider.  Do not use any medicinal herbs or unprescribed drugs. These chemicals affect the formation and growth of the baby.  Do not douche or use tampons or scented sanitary pads.  Do not cross your legs for long periods of time.  To prepare for the arrival of your baby: ? Take prenatal classes to understand, practice, and ask questions about labor and delivery. ? Make a trial run to the hospital. ? Visit the hospital and tour the maternity area. ? Arrange for maternity or paternity leave through employers. ? Arrange for family and friends to take care of pets while you are in the hospital. ? Purchase a rear-facing car seat and make sure you know how to install it in your car. ? Pack your hospital bag. ? Prepare the baby's nursery. Make sure to remove all pillows and stuffed animals from the baby's crib to prevent suffocation.  Visit your dentist if you have not gone during your pregnancy. Use a soft toothbrush to brush your teeth and be gentle when you floss. Contact a health care provider if:  You are unsure if you are in labor or if your water has broken.  You become dizzy.  You have mild pelvic cramps, pelvic pressure, or nagging pain in your abdominal area.  You have lower back pain.  You have persistent nausea, vomiting, or  diarrhea.  You have an unusual or bad smelling vaginal discharge.  You have pain when you urinate. Get help right away if:  Your water breaks before 37 weeks.  You have regular contractions less than 5 minutes apart before 37 weeks.  You have a fever.  You are leaking fluid from your vagina.  You have spotting or bleeding from your vagina.  You have severe abdominal pain or cramping.  You have rapid weight loss or weight gain.  You have   shortness of breath with chest pain.  You notice sudden or extreme swelling of your face, hands, ankles, feet, or legs.  Your baby makes fewer than 10 movements in 2 hours.  You have severe headaches that do not go away when you take medicine.  You have vision changes. Summary  The third trimester is from week 28 through week 40, months 7 through 9. The third trimester is a time when the unborn baby (fetus) is growing rapidly.  During the third trimester, your discomfort may increase as you and your baby continue to gain weight. You may have abdominal, leg, and back pain, sleeping problems, and an increased need to urinate.  During the third trimester your breasts will keep growing and they will continue to become tender. A yellow fluid (colostrum) may leak from your breasts. This is the first milk you are producing for your baby.  False labor is a condition in which you feel small, irregular tightenings of the muscles in the womb (contractions) that eventually go away. These are called Braxton Hicks contractions. Contractions may last for hours, days, or even weeks before true labor sets in.  Signs of labor can include: abdominal cramps; regular contractions that start at 10 minutes apart and become stronger and more frequent with time; watery or bloody mucus discharge that comes from the vagina; increased pelvic pressure and dull back pain; and leaking of amniotic fluid. This information is not intended to replace advice given to you by your  health care provider. Make sure you discuss any questions you have with your health care provider. Document Released: 08/23/2001 Document Revised: 10/04/2016 Document Reviewed: 10/04/2016 Elsevier Interactive Patient Education  2019 [employer]  

## 2019-02-11 NOTE — Progress Notes (Signed)
   PRENATAL VISIT NOTE  Subjective:  Courtney [name] is a 28 y.o. [phone] at [redacted]w[redacted]d being seen today for ongoing prenatal care.  She is currently monitored for the following issues for this low-risk pregnancy and has Common migraine with intractable migraine; Missed abortion; Supervision of other normal pregnancy, antepartum; History of cesarean section complicating pregnancy; Short interval between pregnancies complicating pregnancy, antepartum; and Umbilical hernia without obstruction and without gangrene on their problem list.  Patient reports no complaints.  Contractions: Irritability. Vag. Bleeding: None.  Movement: Present. Denies leaking of fluid.   The following portions of the patient's history were reviewed and updated as appropriate: allergies, current medications, past family history, past medical history, past social history, past surgical history and problem list.   Objective:   Vitals:   02/11/19 1428  BP: 123/80  Pulse: (!) 105  Weight: 203 lb 3.2 oz (92.2 kg)    Fetal Status:     Movement: Present     General:  Alert, oriented and cooperative. Patient is in no acute distress.  Skin: Skin is warm and dry. No rash noted.   Cardiovascular: Normal heart rate noted  Respiratory: Normal respiratory effort, no problems with respiration noted  Abdomen: Soft, gravid, appropriate for gestational age.  Pain/Pressure: Absent     Pelvic: Cervical exam deferred        Extremities: Normal range of motion.  Edema: None  Mental Status: Normal mood and affect. Normal behavior. Normal judgment and thought content.   Assessment and Plan:  Pregnancy: [id number] at [redacted]w[redacted]d 1. Vaginal discharge during pregnancy, antepartum, third trimester -Increase in discharge x 2 weeks, no itching/burning/odor. Wearing pantyliner, not requiring a pad.  --Likely physiologic, reviewed s/sx of PPROM, reasons to seek care.  2. Supervision of other normal pregnancy, antepartum --Anticipatory guidance about  next visits/weeks of pregnancy given. --Reviewed safety, visitor policy, reassurance about COVID-19 for pregnancy at this time. Discussed possible changes to visits, including televisits, that may occur due to COVID-19.  The office remains open if pt needs to be seen and MAU is open 24 hours/day for OB emergencies. --Fetal position vertex by [place], no SVE today, pt had exam in MAU 5/23 and denies any frequent cramping/contrations.   - Strep Gp B NAA - Cervicovaginal ancillary only( [place])  3. History of cesarean section complicating pregnancy --Vaginal delivery x 1 then C/S for fetal indications. Desires VBAC. --Consent reviewed and signed today  Preterm labor symptoms and general obstetric precautions including but not limited to vaginal bleeding, contractions, leaking of fluid and fetal movement were reviewed in detail with the patient. Please refer to After Visit Summary for other counseling recommendations.   No follow-ups on file.  No future appointments.  Sharen Counter, CNM

## 2019-02-12 LAB — CERVICOVAGINAL ANCILLARY ONLY
Chlamydia: NEGATIVE
Neisseria Gonorrhea: NEGATIVE

## 2019-02-13 LAB — STREP GP B NAA: Strep Gp B NAA: NEGATIVE

## 2019-03-01 ENCOUNTER — Encounter: Payer: Medicaid Other | Admitting: Obstetrics

## 2019-03-03 ENCOUNTER — Inpatient Hospital Stay (HOSPITAL_COMMUNITY)
Admission: AD | Admit: 2019-03-03 | Discharge: 2019-03-05 | DRG: 807 | Disposition: A | Discharge status: Home / Self Care | Payer: Medicaid Other | Attending: Obstetrics & Gynecology | Admitting: Obstetrics & Gynecology

## 2019-03-03 ENCOUNTER — Other Ambulatory Visit: Payer: Self-pay

## 2019-03-03 ENCOUNTER — Encounter (HOSPITAL_COMMUNITY): Payer: Self-pay

## 2019-03-03 DIAGNOSIS — O326XX0 Maternal care for compound presentation, not applicable or unspecified: Secondary | ICD-10-CM | POA: Diagnosis present

## 2019-03-03 DIAGNOSIS — O9962 Diseases of the digestive system complicating childbirth: Secondary | ICD-10-CM | POA: Diagnosis present

## 2019-03-03 DIAGNOSIS — O34219 Maternal care for unspecified type scar from previous cesarean delivery: Secondary | ICD-10-CM | POA: Diagnosis present

## 2019-03-03 DIAGNOSIS — Z1159 Encounter for screening for other viral diseases: Secondary | ICD-10-CM

## 2019-03-03 DIAGNOSIS — E669 Obesity, unspecified: Secondary | ICD-10-CM | POA: Diagnosis present

## 2019-03-03 DIAGNOSIS — Z98891 History of uterine scar from previous surgery: Secondary | ICD-10-CM | POA: Diagnosis not present

## 2019-03-03 DIAGNOSIS — O99214 Obesity complicating childbirth: Secondary | ICD-10-CM | POA: Diagnosis present

## 2019-03-03 DIAGNOSIS — Z3A39 39 weeks gestation of pregnancy: Secondary | ICD-10-CM

## 2019-03-03 DIAGNOSIS — O26893 Other specified pregnancy related conditions, third trimester: Secondary | ICD-10-CM | POA: Diagnosis present

## 2019-03-03 DIAGNOSIS — K429 Umbilical hernia without obstruction or gangrene: Secondary | ICD-10-CM | POA: Diagnosis present

## 2019-03-03 LAB — CBC
HCT: 33.5 % — ABNORMAL LOW (ref 36.0–46.0)
Hemoglobin: 11.1 g/dL — ABNORMAL LOW (ref 12.0–15.0)
MCH: 30.6 pg (ref 26.0–34.0)
MCHC: 33.1 g/dL (ref 30.0–36.0)
MCV: 92.3 fL (ref 80.0–100.0)
Platelets: 171 10*3/uL (ref 150–400)
RBC: 3.63 MIL/uL — ABNORMAL LOW (ref 3.87–5.11)
RDW: 13.7 % (ref 11.5–15.5)
WBC: 12.4 10*3/uL — ABNORMAL HIGH (ref 4.0–10.5)
nRBC: 0 % (ref 0.0–0.2)

## 2019-03-03 MED ORDER — LACTATED RINGERS IV SOLN
INTRAVENOUS | Status: DC
  Administered 2019-03-03 – 2019-03-04 (×2): via INTRAVENOUS

## 2019-03-03 MED ORDER — OXYTOCIN BOLUS FROM INFUSION
500.0000 mL | Freq: Once | INTRAVENOUS | Status: AC
  Administered 2019-03-04: 500 mL via INTRAVENOUS

## 2019-03-03 MED ORDER — ACETAMINOPHEN 325 MG PO TABS: 650.0000 mg | ORAL_TABLET | ORAL | Status: DC | PRN

## 2019-03-03 MED ORDER — SOD CITRATE-CITRIC ACID 500-334 MG/5ML PO SOLN: 30.0000 mL | ORAL | Status: DC | PRN

## 2019-03-03 MED ORDER — ONDANSETRON HCL 4 MG/2ML IJ SOLN: 4.0000 mg | Freq: Four times a day (QID) | INTRAMUSCULAR | Status: DC | PRN

## 2019-03-03 MED ORDER — OXYCODONE-ACETAMINOPHEN 5-325 MG PO TABS: 2.0000 | ORAL_TABLET | ORAL | Status: DC | PRN

## 2019-03-03 MED ORDER — OXYCODONE-ACETAMINOPHEN 5-325 MG PO TABS: 1.0000 | ORAL_TABLET | ORAL | Status: DC | PRN

## 2019-03-03 MED ORDER — FLEET ENEMA 7-19 GM/118ML RE ENEM: 1.0000 | ENEMA | RECTAL | Status: DC | PRN

## 2019-03-03 MED ORDER — LIDOCAINE HCL (PF) 1 % IJ SOLN: 30.0000 mL | INTRAMUSCULAR | Status: DC | PRN

## 2019-03-03 MED ORDER — OXYTOCIN 40 UNITS IN NORMAL SALINE INFUSION - SIMPLE MED
2.5000 [IU]/h | INTRAVENOUS | Status: DC
  Filled 2019-03-03: qty 1000

## 2019-03-03 MED ORDER — LACTATED RINGERS IV SOLN
500.0000 mL | INTRAVENOUS | Status: DC | PRN
  Administered 2019-03-04: 500 mL via INTRAVENOUS

## 2019-03-03 NOTE — H&P (Addendum)
OBSTETRIC ADMISSION HISTORY AND PHYSICAL  Courtney [name] is a 28 y.o. female [phone] with IUP at 5548w0d by LMP c/w 6 weeks US presenting for SOL. Reports contractions every 3 mins. Denies LOF or vaginal bleeding. Good fetal movement.  [facility]  Support person in labor: FOB: Gerlene BurdockNicholas  Ultrasounds . Anatomy U/S: normal  Prenatal History/Complications: . History of Cesarean Section complicating pregnancy- cord prolapse    Past Medical History: Past Medical History:  Diagnosis Date  . Allergic rhinitis   . Back pain   . Common migraine with intractable migraine 12/16/2014   migraines PRN meds  . Depression    denies  . Eczema   . GERD (gastroesophageal reflux disease)    takes meds PRN   . Infection    UTI  . Scoliosis   . Vitamin D deficiency     Past Surgical History: Past Surgical History:  Procedure Laterality Date  . CESAREAN SECTION N/A 01/17/2018   Procedure: CESAREAN SECTION;  Surgeon: Philip Aspenallahan, Sidney, DO;  [facility];  Service: Obstetrics;  Laterality: N/A;  . DILATION AND EVACUATION N/A 05/06/2015   Procedure: DILATATION AND EVACUATION;  Surgeon: Edwinna Areolaecilia Worema Banga, DO;  Location: [facility] ORS;  Service: Gynecology;  Laterality: N/A;    Obstetrical History: OB History    Gravida  4   Para  2   Term  2   Preterm      AB  1   Living  2     SAB  1   TAB      Ectopic      Multiple      Live Births  2           Social History: Social History   Socioeconomic History  . Marital status: Single    Spouse name: Not on file  . Number of children: 1  . Years of education: 3112  . Highest education level: Not on file  Occupational History  . Occupation: [employer]  Social Needs  . Financial resource strain: Not on file  . Food insecurity    Worry: Not on file    Inability: Not on file  . Transportation needs    Medical: Not on file    Non-medical: Not on file  Tobacco Use   . Smoking status: Never Smoker  . Smokeless tobacco: Never Used  Substance and Sexual Activity  . Alcohol use: No    Comment: occasionally  . Drug use: No  . Sexual activity: Yes    Birth control/protection: None  Lifestyle  . Physical activity    Days per week: Not on file    Minutes per session: Not on file  . Stress: Not on file  Relationships  . Social Musicianconnections    Talks on phone: Not on file    Gets together: Not on file    Attends religious service: Not on file    Active member of club or organization: Not on file    Attends meetings of clubs or organizations: Not on file    Relationship status: Not on file  Other Topics Concern  . Not on file  Social History Narrative   Patient is right handed.   Patient drinks 2 cups of caffeine daily.    Family History: Family History  Problem Relation Age of Onset  . Migraines Mother   . Asthma Mother   . Healthy Sister   . Healthy Brother   .  Healthy Brother   . Healthy Brother   . Cancer Maternal Grandmother   . Cancer Paternal Grandfather     Allergies: Allergies  Allergen Reactions  . No Known Allergies     Medications Prior to Admission  Medication Sig Dispense Refill Last Dose  . Prenat w/o A Vit-FeFum-FePo-FA (CONCEPT OB) 130-92.4-1 MG CAPS Take 1 tablet by mouth daily. 30 capsule 12      Review of Systems  All systems reviewed and negative except as stated in HPI  Blood pressure 125/74, pulse (!) 109, temperature 98.5 F (36.9 C), temperature source Oral, resp. rate 16, last menstrual period 06/03/2018, SpO2 98 %, unknown if currently breastfeeding. General appearance: alert, cooperative and no distress Lungs: no respiratory distress Heart: regular rate  Abdomen: soft, non-tender; gravid  Pelvic: adequate Extremities: Homans sign is negative, no sign of DVT Presentation: cephalic Fetal monitoring: FHR 145, moderate variability, +accels, no decels Uterine activity: contractions q4 mins     Prenatal labs: ABO, Rh: A/Positive/-- (12/09 1657) Antibody: Negative (12/09 1657) Rubella: 3.59 (12/09 1657) RPR: Non Reactive (03/30 0946)  HBsAg: Negative (12/09 1657)  HIV: Non Reactive (03/30 0946)  GBS: Negative (06/01 0313)  Glucola: normal Genetic screening: normal  Prenatal Transfer Tool  Maternal Diabetes: No Genetic Screening: Normal Maternal Ultrasounds/Referrals: Normal Fetal Ultrasounds or other Referrals:  None Maternal Substance Abuse:  No Significant Maternal Medications:  None Significant Maternal Lab Results: Group B Strep negative  No results found for this or any previous visit (from the past 24 hour(s)).  Patient Active Problem List   Diagnosis Date Noted  . Umbilical hernia without obstruction and without gangrene 10/15/2018  . History of cesarean section complicating pregnancy 24/26/8341  . Short interval between pregnancies complicating pregnancy, antepartum 08/20/2018  . Supervision of other normal pregnancy, antepartum 01/17/2018  . Missed abortion 05/06/2015  . Common migraine with intractable migraine 12/16/2014    Assessment/Plan:  Courtney [name] is a 28 y.o. [id number] at [redacted]w[redacted]d here for SOL at 7-8cm per MAU RN check.  Labor:  -- Expectant management; anticipate NSVD -- Pain control: Planning none but open to epidural -- GBS negative  Fetal Wellbeing:  -- Cephalic by RN cervical exam  -- Cat 1 tracing  Postpartum Planning -- breast -- contraception: undecided -- Needs tdap  Courtney [name], SNM  I confirm that I have verified the information documented in the nurse midwife student's note and that I have also personally reperformed the history, physical exam and all medical decision making activities of this service and have verified that all service and findings are accurately documented in this student's note.   Courtney [name], CNM 03/04/2019 12:13 AM

## 2019-03-04 ENCOUNTER — Encounter (HOSPITAL_COMMUNITY): Payer: Self-pay | Admitting: *Deleted

## 2019-03-04 DIAGNOSIS — O34219 Maternal care for unspecified type scar from previous cesarean delivery: Secondary | ICD-10-CM

## 2019-03-04 DIAGNOSIS — Z3A39 39 weeks gestation of pregnancy: Secondary | ICD-10-CM

## 2019-03-04 DIAGNOSIS — Z98891 History of uterine scar from previous surgery: Secondary | ICD-10-CM | POA: Diagnosis not present

## 2019-03-04 LAB — TYPE AND SCREEN
ABO/RH(D): A POS
Antibody Screen: NEGATIVE

## 2019-03-04 LAB — SARS CORONAVIRUS 2 BY RT PCR (HOSPITAL ORDER, PERFORMED IN ~~LOC~~ HOSPITAL LAB): SARS Coronavirus 2: NEGATIVE

## 2019-03-04 LAB — ABO/RH: ABO/RH(D): A POS

## 2019-03-04 LAB — RPR: RPR Ser Ql: NONREACTIVE

## 2019-03-04 MED ORDER — TERBUTALINE SULFATE 1 MG/ML IJ SOLN: 0.2500 mg | Freq: Once | INTRAMUSCULAR | Status: DC | PRN

## 2019-03-04 MED ORDER — DIPHENHYDRAMINE HCL 50 MG/ML IJ SOLN: 12.5000 mg | INTRAMUSCULAR | Status: DC | PRN

## 2019-03-04 MED ORDER — OXYTOCIN 40 UNITS IN NORMAL SALINE INFUSION - SIMPLE MED
1.0000 m[IU]/min | INTRAVENOUS | Status: DC
  Administered 2019-03-04: 2 m[IU]/min via INTRAVENOUS

## 2019-03-04 MED ORDER — ACETAMINOPHEN 325 MG PO TABS
650.0000 mg | ORAL_TABLET | ORAL | Status: DC | PRN
  Administered 2019-03-04: 650 mg via ORAL
  Filled 2019-03-04: qty 2

## 2019-03-04 MED ORDER — PHENYLEPHRINE 40 MCG/ML (10ML) SYRINGE FOR IV PUSH (FOR BLOOD PRESSURE SUPPORT): 80.0000 ug | PREFILLED_SYRINGE | INTRAVENOUS | Status: DC | PRN

## 2019-03-04 MED ORDER — DIPHENHYDRAMINE HCL 25 MG PO CAPS: 25.0000 mg | ORAL_CAPSULE | Freq: Four times a day (QID) | ORAL | Status: DC | PRN

## 2019-03-04 MED ORDER — PRENATAL MULTIVITAMIN CH
1.0000 | ORAL_TABLET | Freq: Every day | ORAL | Status: DC
  Administered 2019-03-05: 1 via ORAL
  Filled 2019-03-04: qty 1

## 2019-03-04 MED ORDER — DIBUCAINE (PERIANAL) 1 % EX OINT: 1.0000 "application " | TOPICAL_OINTMENT | CUTANEOUS | Status: DC | PRN

## 2019-03-04 MED ORDER — LACTATED RINGERS IV SOLN: 500.0000 mL | Freq: Once | INTRAVENOUS | Status: DC

## 2019-03-04 MED ORDER — WITCH HAZEL-GLYCERIN EX PADS: 1.0000 "application " | MEDICATED_PAD | CUTANEOUS | Status: DC | PRN

## 2019-03-04 MED ORDER — BENZOCAINE-MENTHOL 20-0.5 % EX AERO
1.0000 "application " | INHALATION_SPRAY | CUTANEOUS | Status: DC | PRN
  Administered 2019-03-04: 1 via TOPICAL
  Filled 2019-03-04: qty 56

## 2019-03-04 MED ORDER — SENNOSIDES-DOCUSATE SODIUM 8.6-50 MG PO TABS
2.0000 | ORAL_TABLET | ORAL | Status: DC
  Administered 2019-03-05: 2 via ORAL
  Filled 2019-03-04: qty 2

## 2019-03-04 MED ORDER — IBUPROFEN 600 MG PO TABS
600.0000 mg | ORAL_TABLET | Freq: Four times a day (QID) | ORAL | Status: DC
  Administered 2019-03-04 – 2019-03-05 (×5): 600 mg via ORAL
  Filled 2019-03-04 (×5): qty 1

## 2019-03-04 MED ORDER — TETANUS-DIPHTH-ACELL PERTUSSIS 5-2.5-18.5 LF-MCG/0.5 IM SUSP
0.5000 mL | Freq: Once | INTRAMUSCULAR | Status: AC
  Administered 2019-03-05: 0.5 mL via INTRAMUSCULAR
  Filled 2019-03-04: qty 0.5

## 2019-03-04 MED ORDER — EPHEDRINE 5 MG/ML INJ: 10.0000 mg | INTRAVENOUS | Status: DC | PRN

## 2019-03-04 MED ORDER — ONDANSETRON HCL 4 MG/2ML IJ SOLN: 4.0000 mg | INTRAMUSCULAR | Status: DC | PRN

## 2019-03-04 MED ORDER — COCONUT OIL OIL: 1.0000 "application " | TOPICAL_OIL | Status: DC | PRN

## 2019-03-04 MED ORDER — FENTANYL CITRATE (PF) 100 MCG/2ML IJ SOLN
100.0000 ug | INTRAMUSCULAR | Status: DC | PRN
  Administered 2019-03-04: 100 ug via INTRAVENOUS
  Filled 2019-03-04: qty 2

## 2019-03-04 MED ORDER — SIMETHICONE 80 MG PO CHEW: 80.0000 mg | CHEWABLE_TABLET | ORAL | Status: DC | PRN

## 2019-03-04 MED ORDER — FENTANYL-BUPIVACAINE-NACL 0.5-0.125-0.9 MG/250ML-% EP SOLN
12.0000 mL/h | EPIDURAL | Status: DC | PRN
  Filled 2019-03-04: qty 250

## 2019-03-04 MED ORDER — ZOLPIDEM TARTRATE 5 MG PO TABS: 5.0000 mg | ORAL_TABLET | Freq: Every evening | ORAL | Status: DC | PRN

## 2019-03-04 MED ORDER — ONDANSETRON HCL 4 MG PO TABS: 4.0000 mg | ORAL_TABLET | ORAL | Status: DC | PRN

## 2019-03-04 NOTE — Progress Notes (Signed)
Labor Progress Note  Subjective: In to check on patient. Pt comfortable, has no complaints. Has been using birthing ball, as well as other position changes as needed. Asking for cervical exam.  Objective: BP 123/70   Pulse 85   Temp 98.3 F (36.8 C) (Oral)   Resp 18   Ht 5\' 2"  (1.575 m)   Wt 93 kg   LMP 06/03/2018 (Exact Date)   SpO2 98%   BMI 37.50 kg/m  Gen: alert, oriented, no distress Dilation: 7 Effacement (%): 60 Cervical Position: Posterior Station: Ballotable Presentation: Vertex Exam by:: Bridgette Habermann, SMW  Assessment and Plan: 28 y.o. [id number] [redacted]w[redacted]d admitted for SOL.  Labor:  -- Contracting every 4-7 mins -- Expectant management at this time. Will consider starting low dose Pitocin if no cervical change is made -- Pain control: none, but open to options of IV pain meds and epidural -- GBS negative -- PPH Risk: high  Fetal Well-Being: -- Category 1; FHR 135, moderate variability, +accels, no decels     Maryagnes Amos, SNM 1:48 AM

## 2019-03-04 NOTE — Lactation Note (Signed)
This note was copied from a baby's chart. Lactation Consultation Note  Patient Name: Courtney [name] DVVOH'Y Date: 03/04/2019 Reason for consult: Initial assessment;Term  60 hours old FT female who is now being partially BF and formula fed by his mother, she's a P3 but not very experienced BF. She didn't BF her other kids longer than two months and most of it was pumping and bottle, mom already supplementing baby with [employer]. She participated in the [facility] program at the [facility] but she wasn't familiar with hand expression, LC revised hand expression with mom and she was able to get a couple of drops of colostrum coming off her right breast. Mom's nipples are short shafted but her tissue is very compressible.  Baby asleep when entering the room and mom was having her dinner she denied any assistance with latch at this point. Asked mom to call for assistance when needed. Dad requested a hand pump to take home, instructions, cleaning and storage were reviewed as well as milk storage guidelines. LC also reviewed formula supplementation guidelines according to baby's age in hours, normal newborn behavior and feeding cues.  Feeding plan:  1. Encouraged mom to feed baby STS 8-12 times/24 hours or sooner if feeding cues are present.  2. Hand expression and spoon feeding were also encouraged  BF brochure, BF resources and feeding diary were reviewed. Parents reported all questions and concerns were answered, they're both aware of [facility] services and will call PRN.  Maternal Data Formula Feeding for Exclusion: No Has patient been taught Hand Expression?: Yes Does the patient have breastfeeding experience prior to this delivery?: Yes  Feeding    Interventions Interventions: Breast feeding basics reviewed;Hand express;Breast massage;Breast compression;Hand pump  Lactation Tools Discussed/Used Tools: Pump Breast pump type: Manual WIC Program: Yes Pump Review: Setup, frequency, and cleaning;Milk  Storage Initiated by:: MPeck   Consult Status Consult Status: Follow-up Date: 03/05/19 Follow-up type: In-patient    Aracelis Ulrey Francene Boyers 03/04/2019, 9:17 PM

## 2019-03-04 NOTE — Discharge Summary (Signed)
Postpartum Discharge Summary     Patient Name: Courtney [name] DOB: 21-Sep-1990 MRN: [id number]  Date of admission: 03/03/2019 Delivering Provider: Sharyon CableOGERS, VERONICA C   Date of discharge: 03/05/2019  Admitting diagnosis: CTX 3 to 4 mins Intrauterine pregnancy: 8725w1d     Secondary diagnosis:  Active Problems:   History of cesarean section complicating pregnancy   Umbilical hernia without obstruction and without gangrene   Indication for care in labor or delivery   VBAC, delivered   Obesity with body mass index 30 or greater  Additional problems: short interval between pregnancies     Discharge diagnosis: Term Pregnancy Delivered and VBAC                                                                                               Post partum procedures:none  Augmentation: Pitocin  Complications: None  Hospital course:  Onset of Labor With Vaginal Delivery     28 y.o. yo [id number] at 6025w1d was admitted in Active Labor on 03/03/2019. Patient had an uncomplicated labor course and progressed to successful VBAC as follows:  Membrane Rupture Time/Date: 7:24 AM ,03/04/2019   Intrapartum Procedures: Episiotomy: None [1]                                         Lacerations:  None [1]  Patient had a delivery of a Viable infant. 03/04/2019  Information for the patient's newborn:  Loleta BooksRodriguez, Boy Sanjana [[id number]][[id number]]  Delivery Method: VBAC, Spontaneous(Filed from Delivery Summary)     Pateint had an uncomplicated postpartum course.  She is ambulating, tolerating a regular diet, passing flatus, and urinating well. Discussed with pt contraceptive options- she is still unsure at this time. Patient is discharged home in stable condition on 03/05/19 per her request for early discharge as long as the baby can go.   Magnesium Sulfate recieved: No BMZ received: No  Physical exam  Vitals:   03/04/19 1508 03/04/19 1831 03/04/19 2239 03/05/19 0601  BP: 124/83 120/77 132/76 138/85  Pulse: 89 88  90 85  Resp: 18 17 18 18   Temp: 98.1 F (36.7 C) 98.3 F (36.8 C) 97.9 F (36.6 C) 98 F (36.7 C)  TempSrc: Oral Oral Oral Oral  SpO2:   97% 99%  Weight:      Height:       General: alert and cooperative Lochia: appropriate Uterine Fundus: firm Incision: N/A DVT Evaluation: No evidence of DVT seen on physical exam. Labs: Lab Results  Component Value Date   WBC 12.4 (H) 03/03/2019   HGB 11.1 (L) 03/03/2019   HCT 33.5 (L) 03/03/2019   MCV 92.3 03/03/2019   PLT 171 03/03/2019   CMP Latest Ref Rng & Units 11/06/2018  Glucose 70 - 99 mg/dL 92  BUN 6 - 20 mg/dL <0(U<5(L)  Creatinine 7.250.44 - 1.00 mg/dL 3.66(Y0.41(L)  Sodium 403135 - 474145 mmol/L 137  Potassium 3.5 - 5.1 mmol/L 3.7  Chloride 98 - 111 mmol/L 105  CO2 22 - 32 mmol/L 22  Calcium 8.9 - 10.3 mg/dL 8.4(L)  Total Protein 6.5 - 8.1 g/dL 6.5  Total Bilirubin 0.3 - 1.2 mg/dL 0.4  Alkaline Phos 38 - 126 U/L 60  AST 15 - 41 U/L 13(L)  ALT 0 - 44 U/L 11    Discharge instruction: per After Visit Summary and "Baby and Me Booklet".  After visit meds:  Allergies as of 03/05/2019      Reactions   No Known Allergies       Medication List    TAKE these medications   Concept OB 130-92.4-1 MG Caps Take 1 tablet by mouth daily.   ibuprofen 600 MG tablet Commonly known as: ADVIL Take 1 tablet (600 mg total) by mouth every 6 (six) hours as needed.       Diet: routine diet  Activity: Advance as tolerated. Pelvic rest for 6 weeks.   Outpatient follow up:4 weeks Follow up Appt: Future Appointments  Date Time Provider [place]  04/04/2019  2:45 PM Lajean Manes, CNM [facility] None   Follow up Visit:   Please schedule this patient for Postpartum visit in: 4 weeks with the following provider: Any provider For C/S patients schedule nurse incision check in weeks 2 weeks: no High risk pregnancy complicated by: Hx of Cesarean section Delivery mode:  VBAC Anticipated Birth Control:  other/unsure PP Procedures needed:  none  Schedule Integrated BH visit: no   Newborn Data: Live born female  Birth Weight:  3289gm (7lb 4oz) APGAR: 7, 9  Newborn Delivery   Birth date/time: 03/04/2019 07:54:00 Delivery type: VBAC      Baby Feeding: Bottle and Breast Disposition:home with mother   03/05/2019 Myrtis Ser, CNM  11:45 AM

## 2019-03-04 NOTE — Progress Notes (Signed)
LABOR PROGRESS NOTE  Courtney [name] is a 28 y.o. [phone] at 104w1d  admitted for SOL  Subjective: Patient breathing through contractions and crying, wanting epidural   Objective: BP 117/76   Pulse 74   Temp 98.2 F (36.8 C) (Oral)   Resp 18   Ht 5\' 2"  (1.575 m)   Wt 93 kg   LMP 06/03/2018 (Exact Date)   SpO2 99%   BMI 37.50 kg/m  or  Vitals:   03/04/19 0655 03/04/19 0659 03/04/19 0700 03/04/19 0731  BP:    117/76  Pulse:    74  Resp:    18  Temp:      TempSrc:      SpO2: 99% 99% 99%   Weight:      Height:        SROM- clear fluid  Dilation: 8.5 Effacement (%): 90 Cervical Position: Posterior Station: -2 Presentation: Vertex Exam by:: k fields, rn FHT: baseline rate 125, moderate varibility, +accel, variable decel Toco: 2-3, strong by palpation   Labs: Lab Results  Component Value Date   WBC 12.4 (H) 03/03/2019   HGB 11.1 (L) 03/03/2019   HCT 33.5 (L) 03/03/2019   MCV 92.3 03/03/2019   PLT 171 03/03/2019    Patient Active Problem List   Diagnosis Date Noted  . Indication for care in labor or delivery 03/03/2019  . Umbilical hernia without obstruction and without gangrene 10/15/2018  . History of cesarean section complicating pregnancy 17/79/3903  . Short interval between pregnancies complicating pregnancy, antepartum 08/20/2018  . Supervision of other normal pregnancy, antepartum 01/17/2018  . Missed abortion 05/06/2015  . Common migraine with intractable migraine 12/16/2014    Assessment / Plan: 28 y.o. E0P2330 at [redacted]w[redacted]d here for SOL   Labor: Augmentation with pitocin, SROM -clear fluid, expectant management  Fetal Wellbeing:  Cat II  Pain Control:  Wanting epidural  Anticipated MOD:  SVD  Lajean Manes, CNM 03/04/2019, 7:39 AM

## 2019-03-04 NOTE — Progress Notes (Signed)
MOB was referred for history of depression/anxiety. * Referral screened out by Clinical Social Worker because none of the following criteria appear to apply: ~ History of anxiety/depression during this pregnancy, or of post-partum depression following prior delivery. ~ Diagnosis of anxiety and/or depression within last 3 years. Per MOB's records, MOB was diagnosed with depression in 2016. Per MOB's H&P, MOB also denies having depression.  OR * MOB's symptoms currently being treated with medication and/or therapy.   Please contact the Clinical Social Worker if needs arise, by [facility] request, or if MOB scores greater than 9/yes to question 10 on [place] Postpartum Depression Screen.      Courtney [name], MSW, LCSW-A [facility] [place] [facility] [place] [phone]

## 2019-03-04 NOTE — Progress Notes (Signed)
Labor Progress Note  Subjective: In to assess patient. Pt reporting contractions more painful now. No other complaints  Objective: BP 121/77   Pulse 90   Temp 98.3 F (36.8 C) (Oral)   Resp 18   Ht 5\' 2"  (1.575 m)   Wt 93 kg   LMP 06/03/2018 (Exact Date)   SpO2 98%   BMI 37.50 kg/m   Dilation: 7 Effacement (%): 60 Cervical Position: Posterior Station: Ballotable Presentation: Vertex Exam by:: Bridgette Habermann, SMW  Assessment and Plan: 28 y.o. [id number] [redacted]w[redacted]d admitted for SOL at 7cm  Labor:  -- Contracting every 5-7 mins -- No cervical change x4 hours -- Start on low dose Pitocin -- Pain control: none at this time, but open to options --GBS negative -- PPH Risk: high  Fetal Well-Being:  -- Category 1; FHR 135, moderate variability, +accels, no decels    Maryagnes Amos, SNM 3:51 AM

## 2019-03-04 NOTE — Plan of Care (Signed)
  Problem: Education: Goal: Knowledge of Childbirth will improve Outcome: Progressing Goal: Ability to make informed decisions regarding treatment and plan of care will improve Outcome: Progressing Goal: Ability to state and carry out methods to decrease the pain will improve Outcome: Progressing Goal: Individualized Educational Video(s) Outcome: Progressing   Problem: Coping: Goal: Ability to verbalize concerns and feelings about labor and delivery will improve Outcome: Progressing   Problem: Life Cycle: Goal: Ability to make normal progression through stages of labor will improve Outcome: Progressing Goal: Ability to effectively push during vaginal delivery will improve Outcome: Progressing   Problem: Role Relationship: Goal: Will demonstrate positive interactions with the child Outcome: Progressing   Problem: Safety: Goal: Risk of complications during labor and delivery will decrease Outcome: Progressing   Problem: Pain Management: Goal: Relief or control of pain from uterine contractions will improve Outcome: Progressing   Problem: Education: Goal: Knowledge of General Education information will improve Description: Including pain rating scale, medication(s)/side effects and non-pharmacologic comfort measures Outcome: Progressing   Problem: Health Behavior/Discharge Planning: Goal: Ability to manage health-related needs will improve Outcome: Progressing   Problem: Clinical Measurements: Goal: Ability to maintain clinical measurements within normal limits will improve Outcome: Progressing Goal: Will remain free from infection Outcome: Progressing Goal: Diagnostic test results will improve Outcome: Progressing Goal: Respiratory complications will improve Outcome: Progressing Goal: Cardiovascular complication will be avoided Outcome: Progressing   Problem: Activity: Goal: Risk for activity intolerance will decrease Outcome: Progressing   Problem:  Nutrition: Goal: Adequate nutrition will be maintained Outcome: Progressing   Problem: Elimination: Goal: Will not experience complications related to bowel motility Outcome: Progressing Goal: Will not experience complications related to urinary retention Outcome: Progressing   Problem: Skin Integrity: Goal: Risk for impaired skin integrity will decrease Outcome: Progressing   

## 2019-03-04 NOTE — Progress Notes (Signed)
Labor Progress Note  Subjective: In to assess patient. Reports a lot of pelvic pressure and more painful contractions. Pt is breathing well with contractions. Has no other complaints.  Objective: BP (!) 115/56   Pulse 72   Temp 98.2 F (36.8 C) (Oral)   Resp 18   Ht 5\' 2"  (1.575 m)   Wt 93 kg   LMP 06/03/2018 (Exact Date)   SpO2 98%   BMI 37.50 kg/m  Gen: alert, oriented, mild distress Dilation: 7 Effacement (%): 60, 70 Cervical Position: Posterior Station: -3 Presentation: Vertex Exam by:: Bridgette Habermann, SMW  Assessment and Plan: 28 y.o. [id number] [redacted]w[redacted]d admitted for SOL at 7cm  Labor:  -- Contracting q3-4 minutes -- Pt cervix unchanged from prior exam. -- Continue increasing Pitocin -- Pain control: IV pain medication -- PPH Risk: high  Fetal Well-Being:  -- Cephalic by cervical exam.  -- Overall reassuring FHR tracing. FHR 135, moderate variability, +accels, occasional variable decels   Maryagnes Amos, SNM 6:34 AM

## 2019-03-05 ENCOUNTER — Encounter: Payer: Medicaid Other | Admitting: Advanced Practice Midwife

## 2019-03-05 LAB — BIRTH TISSUE RECOVERY COLLECTION (PLACENTA DONATION)

## 2019-03-05 MED ORDER — IBUPROFEN 600 MG PO TABS: 600.0000 mg | ORAL_TABLET | Freq: Four times a day (QID) | ORAL | 0 refills | Status: DC | PRN

## 2019-03-05 NOTE — Lactation Note (Addendum)
This note was copied from a baby's chart. Lactation Consultation Note  Patient Name: Courtney [name] [id number] Date: 03/05/2019 Reason for consult: Follow-up assessment   Baby 75 hours old.  [name] RN requesting assistance w/ latching. Mother had difficulty latching first 2 children and pumped w/ manual pump for 2 mos. Prepumping helped evert nipple to latch. Noted some biting and short mid posterior lingual frenulum. Parents state baby has had good tongue protrusion. Mother started formula last night due to difficulty latching. Baby sucked LC finger, did some biting. Assisted w/ latching baby in cross cradle hold on R side and football on L side for 20 min.  Baby had approx 10 ml prior to latching that mother had pumped. Reviewed how to compress breast to achieve more depth. Recommend mother post pump 4-6 times per day for 10-20 min with DEBP on initiation setting. Give baby back volume pumped at the next feeding. Reviewed cleaning and milk storage.  Reviewed how to convert DEBP to manual. Will have RN review how to finger syringe and use NS if pain continues.      Maternal Data    Feeding Feeding Type: Breast Fed  LATCH Score Latch: Repeated attempts needed to sustain latch, nipple held in mouth throughout feeding, stimulation needed to elicit sucking reflex.  Audible Swallowing: A few with stimulation  Type of Nipple: Everted at rest and after stimulation  Comfort (Breast/Nipple): Soft / non-tender  Hold (Positioning): Assistance needed to correctly position infant at breast and maintain latch.  LATCH Score: 7  Interventions Interventions: Breast feeding basics reviewed;Assisted with latch;Hand express;Pre-pump if needed;Hand pump;DEBP;Adjust position  Lactation Tools Discussed/Used Pump Review: Setup, frequency, and cleaning;Milk Storage Initiated by:: Vivianne Master RN IBCLC Date initiated:: 03/05/19   Consult Status Consult Status: Follow-up Date:  03/06/19 Follow-up type: In-patient    Vivianne Master [facility] 03/05/2019, 9:16 AM

## 2019-03-05 NOTE — Discharge Instructions (Signed)
Vaginal Delivery, Care After °Refer to this sheet in the next few weeks. These instructions provide you with information about caring for yourself after vaginal delivery. Your health care provider may also give you more specific instructions. Your treatment has been planned according to current medical practices, but problems sometimes occur. Call your health care provider if you have any problems or questions. °What can I expect after the procedure? °After vaginal delivery, it is common to have: °· Some bleeding from your vagina. °· Soreness in your abdomen, your vagina, and the area of skin between your vaginal opening and your anus (perineum). °· Pelvic cramps. °· Fatigue. °Follow these instructions at home: °Medicines °· Take over-the-counter and prescription medicines only as told by your health care provider. °· If you were prescribed an antibiotic medicine, take it as told by your health care provider. Do not stop taking the antibiotic until it is finished. °Driving ° °· Do not drive or operate heavy machinery while taking prescription pain medicine. °· Do not drive for 24 hours if you received a sedative. °Lifestyle °· Do not drink alcohol. This is especially important if you are breastfeeding or taking medicine to relieve pain. °· Do not use tobacco products, including cigarettes, chewing tobacco, or e-cigarettes. If you need help quitting, ask your health care provider. °Eating and drinking °· Drink at least 8 eight-ounce glasses of water every day unless you are told not to by your health care provider. If you choose to breastfeed your baby, you may need to drink more water than this. °· Eat high-fiber foods every day. These foods may help prevent or relieve constipation. High-fiber foods include: °? Whole grain cereals and breads. °? Brown rice. °? Beans. °? Fresh fruits and vegetables. °Activity °· Return to your normal activities as told by your health care provider. Ask your health care provider what  activities are safe for you. °· Rest as much as possible. Try to rest or take a nap when your baby is sleeping. °· Do not lift anything that is heavier than your baby or 10 lb (4.5 kg) until your health care provider says that it is safe. °· Talk with your health care provider about when you can engage in sexual activity. This may depend on your: °? Risk of infection. °? Rate of healing. °? Comfort and desire to engage in sexual activity. °Vaginal Care °· If you have an episiotomy or a vaginal tear, check the area every day for signs of infection. Check for: °? More redness, swelling, or pain. °? More fluid or blood. °? Warmth. °? Pus or a bad smell. °· Do not use tampons or douches until your health care provider says this is safe. °· Watch for any blood clots that may pass from your vagina. These may look like clumps of dark red, brown, or black discharge. °General instructions °· Keep your perineum clean and dry as told by your health care provider. °· Wear loose, comfortable clothing. °· Wipe from front to back when you use the toilet. °· Ask your health care provider if you can shower or take a bath. If you had an episiotomy or a perineal tear during labor and delivery, your health care provider may tell you not to take baths for a certain length of time. °· Wear a bra that supports your breasts and fits you well. °· If possible, have someone help you with household activities and help care for your baby for at least a few days after you   leave the hospital. °· Keep all follow-up visits for you and your baby as told by your health care provider. This is important. °Contact a health care provider if: °· You have: °? Vaginal discharge that has a bad smell. °? Difficulty urinating. °? Pain when urinating. °? A sudden increase or decrease in the frequency of your bowel movements. °? More redness, swelling, or pain around your episiotomy or vaginal tear. °? More fluid or blood coming from your episiotomy or vaginal  tear. °? Pus or a bad smell coming from your episiotomy or vaginal tear. °? A fever. °? A rash. °? Little or no interest in activities you used to enjoy. °? Questions about caring for yourself or your baby. °· Your episiotomy or vaginal tear feels warm to the touch. °· Your episiotomy or vaginal tear is separating or does not appear to be healing. °· Your breasts are painful, hard, or turn red. °· You feel unusually sad or worried. °· You feel nauseous or you vomit. °· You pass large blood clots from your vagina. If you pass a blood clot from your vagina, save it to show to your health care provider. Do not flush blood clots down the toilet without having your health care provider look at them. °· You urinate more than usual. °· You are dizzy or light-headed. °· You have not breastfed at all and you have not had a menstrual period for 12 weeks after delivery. °· You have stopped breastfeeding and you have not had a menstrual period for 12 weeks after you stopped breastfeeding. °Get help right away if: °· You have: °? Pain that does not go away or does not get better with medicine. °? Chest pain. °? Difficulty breathing. °? Blurred vision or spots in your vision. °? Thoughts about hurting yourself or your baby. °· You develop pain in your abdomen or in one of your legs. °· You develop a severe headache. °· You faint. °· You bleed from your vagina so much that you fill two sanitary pads in one hour. °This information is not intended to replace advice given to you by your health care provider. Make sure you discuss any questions you have with your health care provider. °Document Released: 08/26/2000 Document Revised: 02/10/2016 Document Reviewed: 09/13/2015 °Elsevier Interactive Patient Education © 2019 [employer] ° °

## 2019-04-04 ENCOUNTER — Other Ambulatory Visit: Payer: Self-pay

## 2019-04-04 ENCOUNTER — Encounter: Payer: Self-pay | Admitting: Certified Nurse Midwife

## 2019-04-04 ENCOUNTER — Ambulatory Visit (INDEPENDENT_AMBULATORY_CARE_PROVIDER_SITE_OTHER): Payer: Medicaid Other | Admitting: Certified Nurse Midwife

## 2019-04-04 DIAGNOSIS — R350 Frequency of micturition: Secondary | ICD-10-CM

## 2019-04-04 DIAGNOSIS — Z3009 Encounter for other general counseling and advice on contraception: Secondary | ICD-10-CM

## 2019-04-04 DIAGNOSIS — Z3202 Encounter for pregnancy test, result negative: Secondary | ICD-10-CM

## 2019-04-04 DIAGNOSIS — Z1389 Encounter for screening for other disorder: Secondary | ICD-10-CM

## 2019-04-04 LAB — POCT URINALYSIS DIPSTICK
Bilirubin, UA: NEGATIVE
Blood, UA: NEGATIVE
Glucose, UA: NEGATIVE
Ketones, UA: NEGATIVE
Nitrite, UA: NEGATIVE
Protein, UA: NEGATIVE
Spec Grav, UA: 1.015 (ref 1.010–1.025)
Urobilinogen, UA: 0.2 E.U./dL
pH, UA: 6.5 (ref 5.0–8.0)

## 2019-04-04 LAB — POCT URINE PREGNANCY: Preg Test, Ur: NEGATIVE

## 2019-04-04 NOTE — Patient Instructions (Signed)

## 2019-04-04 NOTE — Progress Notes (Signed)
Pt complains of having urinary frequency. She also states that she desires to start depo inj. Pt has has IC since delivery. Last time IC was yesterday.

## 2019-04-04 NOTE — Progress Notes (Signed)
[place] Partum Exam  Courtney [name] is a 28 y.o. [phone] female who presents for a postpartum visit. She is 4 weeks postpartum following a spontaneous vaginal delivery. I have fully reviewed the prenatal and intrapartum course. The delivery was at 77 gestational weeks.  Anesthesia: IV sedation. Postpartum course has been uncomplicated. Baby's course has been uncomplicated. Baby is feeding by breast. Bleeding no bleeding. Bowel function is normal. Bladder function is normal. Patient is sexually active. Contraception method is none. Postpartum depression screening:neg  The following portions of the patient's history were reviewed and updated as appropriate: allergies, current medications and problem list.  Needs pap postpartum   Review of Systems A comprehensive review of systems was negative.    Objective:  Last menstrual period 06/03/2018, unknown if currently breastfeeding.  General:  alert, cooperative and no distress   Breasts:  inspection negative, no nipple discharge or bleeding, no masses or nodularity palpable  Lungs: clear to auscultation bilaterally  Heart:  regular rate and rhythm  Abdomen: soft, non-tender; bowel sounds normal; no masses,  no organomegaly   Vulva:  not evaluated  Vagina: not evaluated  Cervix:  not evaluated  Corpus: not examined  Adnexa:  not evaluated  Rectal Exam: Not performed.        Assessment/Plan:  1. Frequent urination - Reports frequent urination over the past 2-3 days since bleeding has stopped - Denies dysuria or urgency  - Urine culture collected and pending, will manage accordingly  - POCT Urinalysis Dipstick - Urine Culture  2. Encounter for counseling regarding contraception - Educated and discussed birth control options in detail  - Patient undecided at this time, OCP vs Depo vs IUD vs Nexplanon - detailed information packets given to patient  - unable to initiate BC today d/t recent unprotected IC, encouraged to abstain from [place] for  2 weeks for initiation, patient verbalizes understanding  - POCT urine pregnancy  3. Postpartum care and examination - Normal postpartum exam. Pap smear not done at today's visit, deferred until next visit for birth control initiation.     Follow up in: 2 weeks for birth control initiation   Lajean Manes, [place] 04/04/19, 4:50 PM

## 2019-04-06 LAB — URINE CULTURE

## 2019-04-18 ENCOUNTER — Other Ambulatory Visit: Payer: Self-pay

## 2019-04-18 ENCOUNTER — Ambulatory Visit (INDEPENDENT_AMBULATORY_CARE_PROVIDER_SITE_OTHER): Payer: Medicaid Other | Admitting: Certified Nurse Midwife

## 2019-04-18 ENCOUNTER — Encounter: Payer: Self-pay | Admitting: Certified Nurse Midwife

## 2019-04-18 VITALS — BP 125/83 | HR 73 | Wt 175.0 lb

## 2019-04-18 DIAGNOSIS — Z30011 Encounter for initial prescription of contraceptive pills: Secondary | ICD-10-CM

## 2019-04-18 DIAGNOSIS — Z3202 Encounter for pregnancy test, result negative: Secondary | ICD-10-CM | POA: Diagnosis not present

## 2019-04-18 LAB — POCT URINE PREGNANCY: Preg Test, Ur: NEGATIVE

## 2019-04-18 MED ORDER — NORGESTIMATE-ETH ESTRADIOL 0.25-35 MG-MCG PO TABS: 1.0000 | ORAL_TABLET | Freq: Every day | ORAL | 3 refills | Status: DC

## 2019-04-18 NOTE — Progress Notes (Signed)
History:  [name] is a 28 y.o. [phone] who presents to clinic today for birth control initiation. Patient is 6 weeks s/p SVD on 6/22. She reports starting her period today. She is not currently breastfeeding and wants to be on pills for birth control. No other GYN complaints or concerns.   The following portions of the patient's history were reviewed and updated as appropriate: allergies, current medications, family history, past medical history, social history, past surgical history and problem list.  Review of Systems:  Review of Systems  Constitutional: Negative.   Respiratory: Negative.   Cardiovascular: Negative.   Gastrointestinal: Negative.   Genitourinary: Negative.   Musculoskeletal: Negative.   Neurological: Negative.      Objective:  Physical Exam BP 125/83   Pulse 73   Wt 175 lb (79.4 kg)   LMP 04/17/2019   Breastfeeding Yes   BMI 32.01 kg/m  Physical Exam HENT:     Head: Normocephalic.  Cardiovascular:     Rate and Rhythm: Normal rate and regular rhythm.  Pulmonary:     Effort: Pulmonary effort is normal.     Breath sounds: Normal breath sounds.  Abdominal:     General: There is no distension.     Palpations: Abdomen is soft.     Tenderness: There is no abdominal tenderness. There is no guarding.  Skin:    General: Skin is warm and dry.  Neurological:     Mental Status: She is alert and oriented to person, place, and time.  Psychiatric:        Mood and Affect: Mood normal.        Behavior: Behavior normal.        Thought Content: Thought content normal.    Labs and Imaging Results for orders placed or performed in visit on 04/18/19 (from the past 24 hour(s))  POCT urine pregnancy     Status: None   Collection Time: 04/18/19 10:04 AM  Result Value Ref Range   Preg Test, Ur Negative Negative    Assessment & Plan:  1. Encounter for initial prescription of contraceptive pills - Educated and discussed birth control options in detail,  patient wants to use pills for birth control but unsure of type  - Discussed POPs vs OCPs, patient reports being done breastfeeding and decided on OCPs  - Patient is 6 weeks PP with no abnormal history of HTN or DVT and not on any medication, UPT negative today in office and Rx for OCPs sent to pharmacy of choice  - Educated and discussed to give OCPs 2-3 months to regular cycles and encouraged to call the office if she has any questions or concerns - POCT urine pregnancy - norgestimate-ethinyl estradiol (ORTHO-CYCLEN) 0.25-35 MG-MCG tablet; Take 1 tablet by mouth daily.  Dispense: 3 Package; Refill: 3   Lajean Manes, [place] 04/18/2019 4:21 PM

## 2019-04-18 NOTE — Progress Notes (Signed)
RGYN pt here to discuss contraception.  Pt denies any unprotected intercourse x 14 days. Pt wants Oral Contraception.  LMP : 04/17/19 pt notes bleeding is heavy.

## 2019-04-18 NOTE — Patient Instructions (Signed)
Oral Contraception Use Oral contraceptive pills (OCPs) are medicines that you take to prevent pregnancy. OCPs work by:  Preventing the ovaries from releasing eggs.  Thickening mucus in the lower part of the uterus (cervix), which prevents sperm from entering the uterus.  Thinning the lining of the uterus (endometrium), which prevents a fertilized egg from attaching to the endometrium. OCPs are highly effective when taken exactly as prescribed. However, OCPs do not prevent sexually transmitted infections (STIs). Safe sex practices, such as using condoms while on an OCP, can help prevent STIs. Before taking OCPs, you may have a physical exam, blood test, and Pap test. A Pap test involves taking a sample of cells from your cervix to check for cancer. Discuss with your health care provider the possible side effects of the OCP you may be prescribed. When you start an OCP, be aware that it can take 2-3 months for your body to adjust to changes in hormone levels. How to take oral contraceptive pills Follow instructions from your health care provider about how to start taking your first cycle of OCPs. Your health care provider may recommend that you:  Start the pill on day 1 of your menstrual period. If you start at this time, you will not need any backup form of birth control (contraception), such as condoms.  Start the pill on the first Sunday after your menstrual period or on the day you get your prescription. In these cases, you will need to use backup contraception for the first week.  Start the pill at any time of your cycle. ? If you take the pill within 5 days of the start of your period, you will not need a backup form of contraception. ? If you start at any other time of your menstrual cycle, you will need to use another form of contraception for 7 days. If your OCP is the type called a minipill, it will protect you from pregnancy after taking it for 2 days (48 hours), and you can stop using  backup contraception after that time. After you have started taking OCPs:  If you forget to take 1 pill, take it as soon as you remember. Take the next pill at the regular time.  If you miss 2 or more pills, call your health care provider. Different pills have different instructions for missed doses. Use backup birth control until your next menstrual period starts.  If you use a 28-day pack that contains inactive pills and you miss 1 of the last 7 pills (pills with no hormones), throw away the rest of the non-hormone pills and start a new pill pack. No matter which day you start the OCP, you will always start a new pack on that same day of the week. Have an extra pack of OCPs and a backup contraceptive method available in case you miss some pills or lose your OCP pack. Follow these instructions at home:  Do not use any products that contain nicotine or tobacco, such as cigarettes and e-cigarettes. If you need help quitting, ask your health care provider.  Always use a condom to protect against STIs. OCPs do not protect against STIs.  Use a calendar to mark the days of your menstrual period.  Read the information and directions that came with your OCP. Talk to your health care provider if you have questions. Contact a health care provider if:  You develop nausea and vomiting.  You have abnormal vaginal discharge or bleeding.  You develop a rash.    You miss your menstrual period. Depending on the type of OCP you are taking, this may be a sign of pregnancy. Ask your health care provider for more information.  You are losing your hair.  You need treatment for mood swings or depression.  You get dizzy when taking the OCP.  You develop acne after taking the OCP.  You become pregnant or think you may be pregnant.  You have diarrhea, constipation, and abdominal pain or cramps.  You miss 2 or more pills. Get help right away if:  You develop chest pain.  You develop shortness of  breath.  You have an uncontrolled or severe headache.  You develop numbness or slurred speech.  You develop visual or speech problems.  You develop pain, redness, and swelling in your legs.  You develop weakness or numbness in your arms or legs. Summary  Oral contraceptive pills (OCPs) are medicines that you take to prevent pregnancy.  OCPs do not prevent sexually transmitted infections (STIs). Always use a condom to protect against STIs.  When you start an OCP, be aware that it can take 2-3 months for your body to adjust to changes in hormone levels.  Read all the information and directions that come with your OCP. This information is not intended to replace advice given to you by your health care provider. Make sure you discuss any questions you have with your health care provider. Document Released: 08/18/2011 Document Revised: 12/21/2018 Document Reviewed: 10/10/2016 Elsevier Patient Education  2020 Elsevier Inc.  

## 2019-04-28 ENCOUNTER — Telehealth: Payer: Medicaid Other | Admitting: Physician Assistant

## 2019-04-28 DIAGNOSIS — J069 Acute upper respiratory infection, unspecified: Secondary | ICD-10-CM

## 2019-04-28 NOTE — Progress Notes (Signed)
E-Visit for Corona Virus Screening   Your current symptoms could be consistent with the coronavirus.  Many health care providers can now test patients at their office but not all are.  [facility] has multiple testing sites. For information on our COVID testing locations and hours go to [email or web address]  Please quarantine yourself while awaiting your test results.  We are enrolling you in our MyChart Home Montioring for COVID19 . Daily you will receive a questionnaire within the MyChart website. Our COVID 19 response team willl be monitoriing your responses daily.    COVID-19 is a respiratory illness with symptoms that are similar to the flu. Symptoms are typically mild to moderate, but there have been cases of severe illness and death due to the virus. The following symptoms may appear 2-14 days after exposure: . Fever . Cough . Shortness of breath or difficulty breathing . Chills . Repeated shaking with chills . Muscle pain . Headache . Sore throat . New loss of taste or smell . Fatigue . Congestion or runny nose . Nausea or vomiting . Diarrhea  It is vitally important that if you feel that you have an infection such as this virus or any other virus that you stay home and away from places where you may spread it to others.  You should self-quarantine for 14 days if you have symptoms that could potentially be coronavirus or have been in close contact a with a person diagnosed with COVID-19 within the last 2 weeks. You should avoid contact with people age 30 and older.   You should wear a mask or cloth face covering over your nose and mouth if you must be around other people or animals, including pets (even at home). Try to stay at least 6 feet away from other people. This will protect the people around you.  Since you are breastfeeding, you can use over the counter nasal saline for your stuffy nose. Over the counter flonase may also be used while  breastfeeding. You may also take acetaminophen (Tylenol) as needed for headache. For cough, I suggest staying well hydrated and using a warm mist humidifier in the home.   Per the Centers for Disease Control and Division (CDC), breast milk is the best source of nutrition for most infants. We do not know whether mothers with COVID-19 can transmit the virus via breast milk, but the limited data available suggest this is not likely. Whether and how to start or continue breastfeeding should be determined by the mother in coordination with her family and healthcare providers. A mother with confirmed COVID-19 should be counseled to take precautions to avoid spreading the virus to her infant, including handwashing and wearing a cloth face covering.    Reduce your risk of any infection by using the same precautions used for avoiding the common cold or flu:  Marland Kitchen. Wash your hands often with soap and warm water for at least 20 seconds.  If soap and water are not readily available, use an alcohol-based hand sanitizer with at least 60% alcohol.  . If coughing or sneezing, cover your mouth and nose by coughing or sneezing into the elbow areas of your shirt or coat, into a tissue or into your sleeve (not your hands). . Avoid shaking hands with others and consider head nods or verbal greetings only. . Avoid touching your eyes, nose, or mouth with unwashed hands.  . Avoid close contact with people who are sick. . Avoid places or events with large numbers of people  in one location, like concerts or sporting events. . Carefully consider travel plans you have or are making. . If you are planning any travel outside or inside the [place], visit the CDC's Travelers' Health webpage for the latest health notices. . If you have some symptoms but not all symptoms, continue to monitor at home and seek medical attention if your symptoms worsen. . If you are having a medical emergency, call 911.  HOME CARE . Only take medications as  instructed by your medical team. . Drink plenty of fluids and get plenty of rest. . A steam or ultrasonic humidifier can help if you have congestion.   GET HELP RIGHT AWAY IF YOU HAVE EMERGENCY WARNING SIGNS** FOR COVID-19. If you or someone is showing any of these signs seek emergency medical care immediately. Call 911 or proceed to your closest emergency facility if: . You develop worsening high fever. . Trouble breathing . Bluish lips or face . Persistent pain or pressure in the chest . New confusion . Inability to wake or stay awake . You cough up blood. . Your symptoms become more severe  **This list is not all possible symptoms. Contact your medical provider for any symptoms that are sever or concerning to you.   MAKE SURE YOU   Understand these instructions.  Will watch your condition.  Will get help right away if you are not doing well or get worse.  Your e-visit answers were reviewed by a board certified advanced clinical practitioner to complete your personal care plan.  Depending on the condition, your plan could have included both over the counter or prescription medications.  If there is a problem please reply once you have received a response from your provider.  Your safety is important to [place].  If you have drug allergies check your prescription carefully.    You can use MyChart to ask questions about today's visit, request a non-urgent call back, or ask for a work or school excuse for 24 hours related to this e-Visit. If it has been greater than 24 hours you will need to follow up with your provider, or enter a new e-Visit to address those concerns. You will get an e-mail in the next two days asking about your experience.  I hope that your e-visit has been valuable and will speed your recovery. Thank you for using e-visits.  I have spent 5 minutes in review of e-visit questionnaire, review and updating patient chart, medical decision making and response to patient.     Tenna Delaine, PA-C

## 2019-05-08 ENCOUNTER — Other Ambulatory Visit: Payer: Self-pay | Admitting: *Deleted

## 2019-05-08 DIAGNOSIS — Z20822 Contact with and (suspected) exposure to covid-19: Secondary | ICD-10-CM

## 2019-05-09 ENCOUNTER — Ambulatory Visit: Payer: Medicaid Other | Admitting: Certified Nurse Midwife

## 2019-05-09 ENCOUNTER — Telehealth: Payer: Self-pay | Admitting: Internal Medicine

## 2019-05-09 LAB — NOVEL CORONAVIRUS, NAA: SARS-CoV-2, NAA: DETECTED — AB

## 2019-05-09 NOTE — Telephone Encounter (Signed)
Pt calling with additional questions regarding her positive covid results. Answered to pt's satisfaction. Pt will direct further questions with her PCP.  Please see result note.

## 2019-05-16 ENCOUNTER — Other Ambulatory Visit: Payer: Self-pay

## 2019-05-16 DIAGNOSIS — Z20822 Contact with and (suspected) exposure to covid-19: Secondary | ICD-10-CM

## 2019-05-17 LAB — NOVEL CORONAVIRUS, NAA: SARS-CoV-2, NAA: NOT DETECTED

## 2019-06-19 ENCOUNTER — Ambulatory Visit: Payer: Medicaid Other | Admitting: Certified Nurse Midwife

## 2019-07-15 ENCOUNTER — Ambulatory Visit: Payer: Medicaid Other | Admitting: Advanced Practice Midwife

## 2019-07-24 ENCOUNTER — Ambulatory Visit (INDEPENDENT_AMBULATORY_CARE_PROVIDER_SITE_OTHER): Payer: Medicaid Other

## 2019-07-24 ENCOUNTER — Other Ambulatory Visit: Payer: Self-pay

## 2019-07-24 VITALS — BP 122/79 | HR 85 | Wt 181.6 lb

## 2019-07-24 DIAGNOSIS — Z3201 Encounter for pregnancy test, result positive: Secondary | ICD-10-CM

## 2019-07-24 NOTE — Progress Notes (Signed)
Agree with A & P. 

## 2019-07-24 NOTE — Progress Notes (Addendum)
Pt presents to the office for UPT. Pt states  LMP was 05/13/19. Pt is 10w 2d, EDD 02/17/2020. Pt advised to start prenatal vitamins and prenatal care. Understanding was voiced.  chiquita l wilson, CMA

## 2019-07-28 ENCOUNTER — Other Ambulatory Visit: Payer: Self-pay | Admitting: Advanced Practice Midwife

## 2019-08-14 ENCOUNTER — Ambulatory Visit (INDEPENDENT_AMBULATORY_CARE_PROVIDER_SITE_OTHER): Payer: Medicaid Other | Admitting: Nurse Practitioner

## 2019-08-14 ENCOUNTER — Other Ambulatory Visit (HOSPITAL_COMMUNITY)
Admission: RE | Admit: 2019-08-14 | Discharge: 2019-08-14 | Disposition: A | Discharge status: Home / Self Care | Payer: Medicaid Other | Source: Ambulatory Visit | Attending: Nurse Practitioner | Admitting: Nurse Practitioner

## 2019-08-14 ENCOUNTER — Other Ambulatory Visit: Payer: Self-pay

## 2019-08-14 ENCOUNTER — Encounter: Payer: Self-pay | Admitting: Nurse Practitioner

## 2019-08-14 VITALS — BP 112/77 | HR 94 | Wt 185.0 lb

## 2019-08-14 DIAGNOSIS — Z3480 Encounter for supervision of other normal pregnancy, unspecified trimester: Secondary | ICD-10-CM

## 2019-08-14 DIAGNOSIS — O09899 Supervision of other high risk pregnancies, unspecified trimester: Secondary | ICD-10-CM

## 2019-08-14 DIAGNOSIS — Z3A13 13 weeks gestation of pregnancy: Secondary | ICD-10-CM

## 2019-08-14 DIAGNOSIS — N760 Acute vaginitis: Secondary | ICD-10-CM

## 2019-08-14 DIAGNOSIS — O09891 Supervision of other high risk pregnancies, first trimester: Secondary | ICD-10-CM

## 2019-08-14 DIAGNOSIS — B9689 Other specified bacterial agents as the cause of diseases classified elsewhere: Secondary | ICD-10-CM

## 2019-08-14 DIAGNOSIS — K429 Umbilical hernia without obstruction or gangrene: Secondary | ICD-10-CM

## 2019-08-14 DIAGNOSIS — E669 Obesity, unspecified: Secondary | ICD-10-CM

## 2019-08-14 DIAGNOSIS — O34219 Maternal care for unspecified type scar from previous cesarean delivery: Secondary | ICD-10-CM

## 2019-08-14 DIAGNOSIS — O23591 Infection of other part of genital tract in pregnancy, first trimester: Secondary | ICD-10-CM

## 2019-08-14 MED ORDER — BLOOD PRESSURE KIT: 1.0000 | PACK | Freq: Once | 0 refills | Status: AC

## 2019-08-14 MED ORDER — VITAFOL GUMMIES 3.33-0.333-34.8 MG PO CHEW: 3.0000 | CHEWABLE_TABLET | Freq: Every day | ORAL | 11 refills | Status: DC

## 2019-08-14 MED ORDER — DOXYLAMINE-PYRIDOXINE 10-10 MG PO TBEC: DELAYED_RELEASE_TABLET | ORAL | 2 refills | Status: DC

## 2019-08-14 NOTE — Progress Notes (Signed)
Subjective:   Courtney [name] is a 28 y.o. [phone] at [redacted]w[redacted]d by LMP being seen today for her first obstetrical visit.  Her obstetrical history is significant for obesity and previous C/S, previous VBAC, short interpregnancy interval. Patient does intend to breast feed. Pregnancy history fully reviewed.  Patient reports nausea.  HISTORY: OB History  Gravida Para Term Preterm AB Living  5 3 3  0 1 3  SAB TAB Ectopic Multiple Live Births  1 0 0 0 3    # Outcome Date GA Lbr Len/2nd Weight Sex Delivery Anes PTL Lv  5 Current           4 Term 03/04/19 [redacted]w[redacted]d / 00:09 7 lb 4 oz (3.289 kg) M VBAC None  LIV     Name: Howells,BOY Jaimarie     Apgar1: 7  Apgar5: 9  3 Term 01/17/18 [redacted]w[redacted]d    CS-Unspec   LIV  2 SAB 2016          1 Term      Vag-Spont   LIV   Past Medical History:  Diagnosis Date  . Allergic rhinitis   . Back pain   . Common migraine with intractable migraine 12/16/2014   migraines PRN meds  . Depression    denies  . Eczema   . GERD (gastroesophageal reflux disease)    takes meds PRN   . Infection    UTI  . Scoliosis   . Supervision of other normal pregnancy, antepartum 01/17/2018   BABYSCRIPTS PATIENT: [x ]Initial [x ]12 [x ]20 [ x]28 [x] 32 [x ]36 [ ] 38 [ ] 39 [ ] 40  Nursing Staff Provider Office Location  [facility] Dating  LMP c/w 6 week 03/19/2018 Language  ENG Anatomy  Wnl, incomplete Flu Vaccine  Declined 12/9 Genetic Screen  NIPS:wnl   AFP:  wnl    TDaP vaccine   info given 12/10/18 Hgb A1C or  GTT Early  Third trimester  Rhogam     LAB RESULTS  Feeding Plan BREAST  Blood Type -  . Vitamin D deficiency    Past Surgical History:  Procedure Laterality Date  . CESAREAN SECTION N/A 01/17/2018   Procedure: CESAREAN SECTION;  Surgeon: [place], DO;  Location: [facility] BIRTHING SUITES;  Service: Obstetrics;  Laterality: N/A;  . DILATION AND EVACUATION N/A 05/06/2015   Procedure: DILATATION AND EVACUATION;  Surgeon: 12/12/18, DO;  Location: [facility] ORS;  Service:  Gynecology;  Laterality: N/A;   Family History  Problem Relation Age of Onset  . Migraines Mother   . Asthma Mother   . Healthy Sister   . Healthy Brother   . Healthy Brother   . Healthy Brother   . Cancer Maternal Grandmother   . Cancer Paternal Grandfather   . Cancer Maternal Aunt    Social History   Tobacco Use  . Smoking status: Never Smoker  . Smokeless tobacco: Never Used  Substance Use Topics  . Alcohol use: No    Comment: occasionally  . Drug use: No   Allergies  Allergen Reactions  . No Known Allergies    No current outpatient medications on file prior to visit.   No current facility-administered medications on file prior to visit.      Exam   Vitals:   08/14/19 1400  BP: 112/77  Pulse: 94  Weight: 185 lb (83.9 kg)   Fetal Heart Rate (bpm): 155  Uterus:   difficult to size due to habitus  Pelvic Exam:  Perineum: no hemorrhoids, normal perineum   Vulva: normal external genitalia, no lesions   Vagina:  normal mucosa, normal discharge   Cervix: no lesions and normal, pap smear done.   Contact bleeding noted.   Adnexa: normal adnexa and no mass, fullness, tenderness   Bony Pelvis: average  System: General: well-developed, well-nourished female in no acute distress   Breast:  normal appearance, no masses or tenderness   Skin: normal coloration and turgor, no rashes   Neurologic: oriented, normal, negative, normal mood   Extremities: normal strength, tone, and muscle mass, ROM of all joints is normal   HEENT extraocular movement intact and sclera clear, anicteric   Mouth/Teeth mucous membranes moist, pharynx normal without lesions and dental hygiene good   Neck supple and no masses, normal thyroid   Cardiovascular: regular rate and rhythm   Respiratory:  no respiratory distress, normal breath sounds   Abdomen: soft, non-tender; no masses,  no organomegaly, umbilical hernia noted 6-7 cm in size - firm and nontender.     Assessment:   Pregnancy:  [id number] Patient Active Problem List   Diagnosis Date Noted  . Supervision of other normal pregnancy, antepartum 08/14/2019  . VBAC, delivered 03/04/2019  . Umbilical hernia without obstruction and without gangrene 10/15/2018  . History of cesarean section complicating pregnancy [id number]  . Short interval between pregnancies complicating pregnancy, antepartum 08/20/2018  . Supervision of normal pregnancy 01/17/2018  . Obesity with body mass index 30 or greater 06/12/2017  . Missed abortion 05/06/2015  . Common migraine with intractable migraine 12/16/2014     Plan:  1. Supervision of other normal pregnancy, antepartum Usually pumps for breastmilk Considering BTL for contraception - advised to sign papers later in the pregnancy but she can change her mind if desired. Advised of medical recommendation for flu vaccine but client declines today. Prescribed prenatal vitamins and diclegis for nausea.  Discussed how to take both. - Babyscripts Schedule Optimization - Culture, OB Urine - Genetic Screening - Obstetric Panel, Including HIV - [place] MFM OB COMP + 14 WK; Future - Cervicovaginal ancillary only( [facility])  2. Short interval between pregnancies complicating pregnancy, antepartum Has never used birth control and was surprised to be pregnant - last birth in June 2020 Considering BTL  Given website for bedsider.org/methods for more information  3. History of cesarean section complicating pregnancy Had VBAC last pregnancy and thinks she wants a VBAC again  4. Obesity with body mass index 30 or greater Advised weight gain of 10-20 pounds this pregnancy  5.  Umbilical hernia Approx 6-7 cm in size - nontender  Initial labs drawn. Continue prenatal vitamins. Genetic Screening discussed, NIPS: ordered. Ultrasound discussed; fetal anatomic survey: ordered. Problem list reviewed and updated. The nature of [facility] with multiple MDs and other  Advanced Practice Providers was explained to patient; also emphasized that residents, students are part of our team. Routine obstetric precautions reviewed. Return in about 4 weeks (around 09/11/2019) for in person for AFP and ROB.  Total face-to-face time with patient: 40 minutes.  Over 50% of encounter was spent on counseling and coordination of care.     Earlie Server, FNP Family Nurse Practitioner, [facility] for [employer], [facility] 08/14/2019 2:57 PM

## 2019-08-14 NOTE — Patient Instructions (Addendum)
Look at contraceptive options on the website - www.Bedsider.org/methods  Use a had ball like a tennis ball to give pressure to the area that hurts on your back.  Back Pain in Pregnancy Back pain during pregnancy is common. Back pain may be caused by several factors that are related to changes during your pregnancy. Follow these instructions at home: Managing pain, stiffness, and swelling      If directed, for sudden (acute) back pain, put ice on the painful area. ? Put ice in a plastic bag. ? Place a towel between your skin and the bag. ? Leave the ice on for 20 minutes, 2-3 times per day.  If directed, apply heat to the affected area before you exercise. Use the heat source that your health care provider recommends, such as a moist heat pack or a heating pad. ? Place a towel between your skin and the heat source. ? Leave the heat on for 20-30 minutes. ? Remove the heat if your skin turns bright red. This is especially important if you are unable to feel pain, heat, or cold. You may have a greater risk of getting burned.  If directed, massage the affected area. Activity  Exercise as told by your health care provider. Gentle exercise is the best way to prevent or manage back pain.  Listen to your body when lifting. If lifting hurts, ask for help or bend your knees. This uses your leg muscles instead of your back muscles.  Squat down when picking up something from the floor. Do not bend over.  Only use bed rest for short periods as told by your health care provider. Bed rest should only be used for the most severe episodes of back pain. Standing, sitting, and lying down  Do not stand in one place for long periods of time.  Use good posture when sitting. Make sure your head rests over your shoulders and is not hanging forward. Use a pillow on your lower back if necessary.  Try sleeping on your side, preferably the left side, with a pregnancy support pillow or 1-2 regular pillows  between your legs. ? If you have back pain after a night's rest, your bed may be too soft. ? A firm mattress may provide more support for your back during pregnancy. General instructions  Do not wear high heels.  Eat a healthy diet. Try to gain weight within your health care provider's recommendations.  Use a maternity girdle, elastic sling, or back brace as told by your health care provider.  Take over-the-counter and prescription medicines only as told by your health care provider.  Work with a physical therapist or massage therapist to find ways to manage back pain. Acupuncture or massage therapy may be helpful.  Keep all follow-up visits as told by your health care provider. This is important. Contact a health care provider if:  Your back pain interferes with your daily activities.  You have increasing pain in other parts of your body. Get help right away if:  You develop numbness, tingling, weakness, or problems with the use of your arms or legs.  You develop severe back pain that is not controlled with medicine.  You have a change in bowel or bladder control.  You develop shortness of breath, dizziness, or you faint.  You develop nausea, vomiting, or sweating.  You have back pain that is a rhythmic, cramping pain similar to labor pains. Labor pain is usually 1-2 minutes apart, lasts for about 1 minute, and involves  a bearing down feeling or pressure in your pelvis.  You have back pain and your water breaks or you have vaginal bleeding.  You have back pain or numbness that travels down your leg.  Your back pain developed after you fell.  You develop pain on one side of your back.  You see blood in your urine.  You develop skin blisters in the area of your back pain. Summary  Back pain may be caused by several factors that are related to changes during your pregnancy.  Follow instructions as told by your health care provider for managing pain, stiffness, and  swelling.  Exercise as told by your health care provider. Gentle exercise is the best way to prevent or manage back pain.  Take over-the-counter and prescription medicines only as told by your health care provider.  Keep all follow-up visits as told by your health care provider. This is important. This information is not intended to replace advice given to you by your health care provider. Make sure you discuss any questions you have with your health care provider. Document Released: 12/07/2005 Document Revised: 12/18/2018 Document Reviewed: 02/14/2018 Elsevier Patient Education  2020 [employer].

## 2019-08-15 LAB — OBSTETRIC PANEL, INCLUDING HIV
Antibody Screen: NEGATIVE
Basophils Absolute: 0 10*3/uL (ref 0.0–0.2)
Basos: 0 %
EOS (ABSOLUTE): 0 10*3/uL (ref 0.0–0.4)
Eos: 0 %
HIV Screen 4th Generation wRfx: NONREACTIVE
Hematocrit: 35.3 % (ref 34.0–46.6)
Hemoglobin: 12 g/dL (ref 11.1–15.9)
Hepatitis B Surface Ag: NEGATIVE
Immature Grans (Abs): 0 10*3/uL (ref 0.0–0.1)
Immature Granulocytes: 0 %
Lymphocytes Absolute: 2 10*3/uL (ref 0.7–3.1)
Lymphs: 23 %
MCH: 30.8 pg (ref 26.6–33.0)
MCHC: 34 g/dL (ref 31.5–35.7)
MCV: 91 fL (ref 79–97)
Monocytes Absolute: 0.5 10*3/uL (ref 0.1–0.9)
Monocytes: 6 %
Neutrophils Absolute: 5.9 10*3/uL (ref 1.4–7.0)
Neutrophils: 71 %
Platelets: 199 10*3/uL (ref 150–450)
RBC: 3.9 x10E6/uL (ref 3.77–5.28)
RDW: 12.1 % (ref 11.7–15.4)
RPR Ser Ql: NONREACTIVE
Rh Factor: POSITIVE
Rubella Antibodies, IGG: 1.68 index (ref >0.99)
WBC: 8.5 10*3/uL (ref 3.4–10.8)

## 2019-08-16 LAB — CERVICOVAGINAL ANCILLARY ONLY
Bacterial Vaginitis (gardnerella): POSITIVE — AB
Candida Glabrata: NEGATIVE
Candida Vaginitis: NEGATIVE
Chlamydia: NEGATIVE
Comment: NEGATIVE
Comment: NEGATIVE
Comment: NEGATIVE
Comment: NEGATIVE
Comment: NEGATIVE
Comment: NORMAL
Neisseria Gonorrhea: NEGATIVE
Trichomonas: NEGATIVE

## 2019-08-16 LAB — CYTOLOGY - PAP
Comment: NEGATIVE
Diagnosis: NEGATIVE
High risk HPV: POSITIVE — AB

## 2019-08-16 LAB — URINE CULTURE, OB REFLEX

## 2019-08-16 LAB — CULTURE, OB URINE

## 2019-08-17 ENCOUNTER — Encounter: Payer: Self-pay | Admitting: Nurse Practitioner

## 2019-08-17 DIAGNOSIS — R87810 Cervical high risk human papillomavirus (HPV) DNA test positive: Secondary | ICD-10-CM | POA: Insufficient documentation

## 2019-08-17 MED ORDER — METRONIDAZOLE 500 MG PO TABS: 500.0000 mg | ORAL_TABLET | Freq: Two times a day (BID) | ORAL | 0 refills | Status: DC

## 2019-08-17 NOTE — Addendum Note (Signed)
Addended by: Virginia Rochester on: 08/17/2019 10:55 AM   Modules accepted: Orders

## 2019-08-17 NOTE — Progress Notes (Signed)
BV noted on wet prep.  MyChart message sent to client and medication sent to her pharmacy.  Earlie Server, RN, MSN, NP-BC Nurse Practitioner, [facility] for [employer], [place] [facility] 08/17/2019 10:54 AM

## 2019-08-21 ENCOUNTER — Encounter: Payer: Self-pay | Admitting: Nurse Practitioner

## 2019-08-22 ENCOUNTER — Encounter: Payer: Self-pay | Admitting: Nurse Practitioner

## 2019-08-23 ENCOUNTER — Telehealth: Payer: Self-pay

## 2019-08-23 NOTE — Telephone Encounter (Signed)
Pt called and reports she has a vaginal lesion she just noticed 2 days ago and she reports it is inflamed and itching. I advised pt we can schedule her to come in sooner for her OB appt to have it looked at. Pt verbalizes understanding. Schedulers made aware.

## 2019-08-28 ENCOUNTER — Encounter: Payer: Self-pay | Admitting: Women's Health

## 2019-08-29 ENCOUNTER — Other Ambulatory Visit: Payer: Self-pay

## 2019-08-29 ENCOUNTER — Ambulatory Visit (INDEPENDENT_AMBULATORY_CARE_PROVIDER_SITE_OTHER): Payer: Medicaid Other | Admitting: Women's Health

## 2019-08-29 VITALS — BP 113/73 | HR 96 | Temp 97.3°F | Wt 186.8 lb

## 2019-08-29 DIAGNOSIS — Z3A15 15 weeks gestation of pregnancy: Secondary | ICD-10-CM

## 2019-08-29 DIAGNOSIS — Z3480 Encounter for supervision of other normal pregnancy, unspecified trimester: Secondary | ICD-10-CM

## 2019-08-29 DIAGNOSIS — Z3482 Encounter for supervision of other normal pregnancy, second trimester: Secondary | ICD-10-CM

## 2019-08-29 DIAGNOSIS — Z711 Person with feared health complaint in whom no diagnosis is made: Secondary | ICD-10-CM

## 2019-08-29 NOTE — Progress Notes (Signed)
Pt presents for ROB problem visit. Lesion resolved now.

## 2019-08-29 NOTE — Progress Notes (Signed)
History:  Courtney [name] is a 28 y.o. [id number] at [redacted]w[redacted]d who presents to clinic today for genital lesion that has resolved since she made the appointment. Patient states that several days ago, she developed 2 bumps on her R outer labia that were the size of a dime. Reports that the lesion has since disappeared. She states that the lesion was accompanied by burning only when urine was running over the lesion. Pt states that she was concerned about the lesion especially in light of recently seeing a positive HPV result on her Pap test. Pt reports that she uses a razor to shave around the vagina. No prior history of these symptoms. No new creams, lotions. She denies fever, chills, urinary sx, BM changes.  The following portions of the patient's history were reviewed and updated as appropriate: allergies, current medications, family history, past medical history, social history, past surgical history and problem list.  Review of Systems:  Review of Systems  Constitutional: Negative for chills, fever and malaise/fatigue.  Gastrointestinal: Negative for abdominal pain, nausea and vomiting.  Genitourinary: Negative for dysuria, frequency and urgency.  Skin: Negative for itching and rash.  See HPI.   Objective:  Physical Exam BP 113/73   Pulse 96   Temp (!) 97.3 F (36.3 C)   Wt 186 lb 12.8 oz (84.7 kg)   LMP 05/13/2019   BMI 34.17 kg/m  Well-appearing, alert, in no acute distress. Normocephalic, atraumatic. Abdomen soft. Breathing unlabored. Alert and oriented x3.  Labs and Imaging No results found for this or any previous visit (from the past 24 hour(s)).  No results found.   Assessment & Plan:  1. Supervision of other normal pregnancy, antepartum - AFP only (15.0-21.6) - HgB A1c  2. Physically well but worried Patient expressed concern about vaginal lesion that has since disappeared in light of recent HPV+ result on Pap. Given temporary nature of lesion, no associated  symptoms, and history of shaving, the most likely cause is an ingrown hair/folliculitis. No further management needed at this time. Pt encouraged to return to clinic PRN. - Advise pt to continue to monitor for recurrence or worsening of symptoms. - We discussed the significance of HPV+ and negative pap smear, and advised patient to return for pap smear in 1 year given positivity for high-risk HPV strain. Patient verbalized understanding.  Return to clinic in 2 weeks for prenatal visit, or as needed.  Clarisa Fling, NP 08/29/2019 4:56 PM

## 2019-08-31 LAB — HEMOGLOBIN A1C
Est. average glucose Bld gHb Est-mCnc: 103 mg/dL
Hgb A1c MFr Bld: 5.2 % (ref 4.8–5.6)

## 2019-08-31 LAB — AFP, SERUM, OPEN SPINA BIFIDA
AFP MoM: 0.64
AFP Value: 17.2 ng/mL
Gest. Age on Collection Date: 15.4 weeks
Maternal Age At EDD: 31.1 yr
OSBR Risk 1 IN: 10000
Test Results:: NEGATIVE
Weight: 186 [lb_av]

## 2019-09-11 ENCOUNTER — Other Ambulatory Visit: Payer: Self-pay

## 2019-09-11 ENCOUNTER — Encounter: Payer: Self-pay | Admitting: Obstetrics and Gynecology

## 2019-09-11 ENCOUNTER — Ambulatory Visit (INDEPENDENT_AMBULATORY_CARE_PROVIDER_SITE_OTHER): Payer: Medicaid Other | Admitting: Obstetrics and Gynecology

## 2019-09-11 VITALS — BP 118/78 | HR 105 | Wt 192.0 lb

## 2019-09-11 DIAGNOSIS — Z3480 Encounter for supervision of other normal pregnancy, unspecified trimester: Secondary | ICD-10-CM

## 2019-09-11 DIAGNOSIS — O34219 Maternal care for unspecified type scar from previous cesarean delivery: Secondary | ICD-10-CM

## 2019-09-11 DIAGNOSIS — Z3A17 17 weeks gestation of pregnancy: Secondary | ICD-10-CM

## 2019-09-11 MED ORDER — VITAFOL GUMMIES 3.33-0.333-34.8 MG PO CHEW: 2.0000 | CHEWABLE_TABLET | Freq: Every day | ORAL | 6 refills | Status: AC

## 2019-09-11 NOTE — Progress Notes (Signed)
   PRENATAL VISIT NOTE  Subjective:  Courtney [name] is a 28 y.o. [phone] at [redacted]w[redacted]d being seen today for ongoing prenatal care.  She is currently monitored for the following issues for this low-risk pregnancy and has Common migraine with intractable migraine; Missed abortion; History of cesarean section complicating pregnancy; Umbilical hernia without obstruction and without gangrene; VBAC, delivered; Obesity with body mass index 30 or greater; Supervision of other normal pregnancy, antepartum; Cervical high risk HPV (human papillomavirus) test positive; and Physically well but worried on their problem list.  Patient reports headache.  Contractions: Not present. Vag. Bleeding: None.  Movement: Absent. Denies leaking of fluid.   The following portions of the patient's history were reviewed and updated as appropriate: allergies, current medications, past family history, past medical history, past social history, past surgical history and problem list.   Objective:   Vitals:   09/11/19 1524  BP: 118/78  Pulse: (!) 105  Weight: 192 lb (87.1 kg)    Fetal Status: Fetal Heart Rate (bpm): + on sono   Movement: Absent     General:  Alert, oriented and cooperative. Patient is in no acute distress.  Skin: Skin is warm and dry. No rash noted.   Cardiovascular: Normal heart rate noted  Respiratory: Normal respiratory effort, no problems with respiration noted  Abdomen: Soft, gravid, appropriate for gestational age.  Pain/Pressure: Absent     Pelvic: Cervical exam deferred        Extremities: Normal range of motion.  Edema: None  Mental Status: Normal mood and affect. Normal behavior. Normal judgment and thought content.   Assessment and Plan:  Pregnancy: [id number] at [redacted]w[redacted]d 1. Supervision of other normal pregnancy, antepartum Patient is doing well with complaints of headaches for which she takes tylenol occasionally. Advised patient to ensure she is well hydrated. If headaches persists despite  hydration and tylenol, she may need to be referred to neurology Anatomy ultrasound scheduled on 09/23/19  2. History of cesarean section complicating pregnancy Patient desires TOLAC and is considering BTL  Preterm labor symptoms and general obstetric precautions including but not limited to vaginal bleeding, contractions, leaking of fluid and fetal movement were reviewed in detail with the patient. Please refer to After Visit Summary for other counseling recommendations.   Return in about 4 weeks (around 10/09/2019) for Virtual, ROB, Low risk.  Future Appointments  Date Time Provider [place]  09/23/2019  1:30 PM WH-MFC [place] 1 WH-MFCUS MFC-US    Mora Bellman, MD

## 2019-09-11 NOTE — Progress Notes (Signed)
ROB With no concerns.   Pt states PNV Gummies were never sent.   HA's every other day no swelling, no visual changes.

## 2019-09-13 NOTE — L&D Delivery Note (Addendum)
Patient is 29 y.o. [id number] [redacted]w[redacted]d admitted for SOL, hx of successful TOLAC   Delivery Note At 8:00 AM a viable female was delivered via VBAC, Spontaneous (Presentation:   Right Occiput Anterior).  APGAR: 8, 9; weight  .   Placenta status:  ,  .  Cord: 3 vessels with the following complications: None.   Anesthesia: Epidural Episiotomy:  none Lacerations:  non3 Suture Repair:  none Est. Blood Loss (mL):  153  Mom to postpartum.  Baby to Couplet care / Skin to Skin.  Upon arrival patient was complete and pushing. She pushed with good maternal effort to deliver a healthy baby boy. Baby delivered without difficulty, was noted to have good tone and place on maternal abdomen for oral suctioning, drying and stimulation. Delayed cord clamping performed. Placenta delivered intact with 3V cord. Vaginal canal and perineum was inspected and no lacerations were noted. Pitocin was started and uterus massaged until bleeding slowed. Counts of sharps, instruments, and lap pads were all correct.     Mirian Mo, MD PGY-2 5/30/20218:12 AM  Attestation:  I confirm that I have verified the information documented in the resident's note and that I have also personally reperformed the physical exam and all medical decision making activities.   I was gloved and present for entire delivery SVD without incident No difficulty with shoulders No lacerations  Aviva Signs, CNM

## 2019-09-23 ENCOUNTER — Other Ambulatory Visit: Payer: Self-pay

## 2019-09-23 ENCOUNTER — Encounter: Payer: Self-pay | Admitting: Nurse Practitioner

## 2019-09-23 ENCOUNTER — Ambulatory Visit (HOSPITAL_COMMUNITY)
Admission: RE | Admit: 2019-09-23 | Discharge: 2019-09-23 | Disposition: A | Discharge status: Home / Self Care | Payer: Medicaid Other | Source: Ambulatory Visit | Attending: Obstetrics and Gynecology | Admitting: Obstetrics and Gynecology

## 2019-09-23 ENCOUNTER — Other Ambulatory Visit (HOSPITAL_COMMUNITY): Payer: Self-pay | Admitting: *Deleted

## 2019-09-23 ENCOUNTER — Other Ambulatory Visit: Payer: Self-pay | Admitting: Nurse Practitioner

## 2019-09-23 DIAGNOSIS — Z3480 Encounter for supervision of other normal pregnancy, unspecified trimester: Secondary | ICD-10-CM | POA: Diagnosis present

## 2019-09-23 DIAGNOSIS — O359XX0 Maternal care for (suspected) fetal abnormality and damage, unspecified, not applicable or unspecified: Secondary | ICD-10-CM | POA: Diagnosis not present

## 2019-09-23 DIAGNOSIS — N2889 Other specified disorders of kidney and ureter: Secondary | ICD-10-CM

## 2019-09-23 DIAGNOSIS — Z3A19 19 weeks gestation of pregnancy: Secondary | ICD-10-CM | POA: Diagnosis not present

## 2019-10-09 ENCOUNTER — Encounter: Payer: Self-pay | Admitting: Certified Nurse Midwife

## 2019-10-09 ENCOUNTER — Telehealth (INDEPENDENT_AMBULATORY_CARE_PROVIDER_SITE_OTHER): Payer: Medicaid Other | Admitting: Certified Nurse Midwife

## 2019-10-09 ENCOUNTER — Encounter: Payer: Self-pay | Admitting: Obstetrics and Gynecology

## 2019-10-09 VITALS — BP 134/76

## 2019-10-09 DIAGNOSIS — O358XX0 Maternal care for other (suspected) fetal abnormality and damage, not applicable or unspecified: Secondary | ICD-10-CM | POA: Insufficient documentation

## 2019-10-09 DIAGNOSIS — Z3480 Encounter for supervision of other normal pregnancy, unspecified trimester: Secondary | ICD-10-CM

## 2019-10-09 DIAGNOSIS — Z3A21 21 weeks gestation of pregnancy: Secondary | ICD-10-CM

## 2019-10-09 DIAGNOSIS — O34219 Maternal care for unspecified type scar from previous cesarean delivery: Secondary | ICD-10-CM

## 2019-10-09 DIAGNOSIS — O35EXX0 Maternal care for other (suspected) fetal abnormality and damage, fetal genitourinary anomalies, not applicable or unspecified: Secondary | ICD-10-CM

## 2019-10-09 DIAGNOSIS — K429 Umbilical hernia without obstruction or gangrene: Secondary | ICD-10-CM

## 2019-10-09 MED ORDER — COMFORT FIT MATERNITY SUPP LG MISC: 1.0000 [IU] | Freq: Every day | 0 refills | Status: DC

## 2019-10-09 NOTE — Progress Notes (Signed)
Pt is on the phone preparing for virtual visit with provider. [redacted]w[redacted]d.

## 2019-10-09 NOTE — Patient Instructions (Signed)

## 2019-10-09 NOTE — Progress Notes (Signed)
TELEHEALTH OBSTETRICS PRENATAL VIRTUAL VIDEO VISIT ENCOUNTER NOTE  Provider location: Center for [facility] [place]   I connected with Lynnae January on 10/09/19 at  3:00 PM EST by MyChart Video Encounter at home and verified that I am speaking with the correct person using two identifiers.   I discussed the limitations, risks, security and privacy concerns of performing an evaluation and management service virtually and the availability of in person appointments. I also discussed with the patient that there may be a patient responsible charge related to this service. The patient expressed understanding and agreed to proceed. Subjective:  Courtney [name] is a 29 y.o. [phone] at [redacted]w[redacted]d being seen today for ongoing prenatal care.  She is currently monitored for the following issues for this high-risk pregnancy and has Common migraine with intractable migraine; History of cesarean section complicating pregnancy; Umbilical hernia without obstruction and without gangrene; History of VBAC; Obesity with body mass index 30 or greater; Supervision of other normal pregnancy, antepartum; Cervical high risk HPV (human papillomavirus) test positive; and Fetal hydronephrosis during pregnancy, antepartum on their problem list.  Patient reports no complaints.  Contractions: Not present. Vag. Bleeding: None.  Movement: Present. Denies any leaking of fluid.   The following portions of the patient's history were reviewed and updated as appropriate: allergies, current medications, past family history, past medical history, past social history, past surgical history and problem list.   Objective:   Vitals:   10/09/19 1447  BP: 134/76    Fetal Status:     Movement: Present     General:  Alert, oriented and cooperative. Patient is in no acute distress.  Respiratory: Normal respiratory effort, no problems with respiration noted  Mental Status: Normal mood and affect. Normal behavior. Normal  judgment and thought content.  Rest of physical exam deferred due to type of encounter  Imaging: [place] MFM OB DETAIL +14 WK  Result Date: 09/23/2019 ----------------------------------------------------------------------  OBSTETRICS REPORT                       (Signed Final 09/23/2019 03:19 pm) ---------------------------------------------------------------------- Patient Info  ID #:       [id number]                          D.O.B.:  March 02, 1991 (28 yrs)  Name:       Courtney [name]             Visit Date: 09/23/2019 02:58 pm ---------------------------------------------------------------------- Performed By  Performed By:     Eden Lathe BS      Ref. Address:     [address]                    [facility]                                                             [address]  [address]                                                             [place]                                                             [address]  Attending:        Lin Landsman      [facility]                                       [facility]  Referred By:      [facility] ---------------------------------------------------------------------- Orders   #  Description                          Code         Ordered By   1  [place] MFM OB DETAIL +14 WK              76811.01     Nolene Bernheim  ----------------------------------------------------------------------   #  Order #                    Accession #                 Episode #   1  [id number]                  [id number]                  [id number]  ---------------------------------------------------------------------- Indications   [redacted] weeks gestation of pregnancy                Z3A.19   Antenatal screening for malformations          Z36.3   Fetal abnormality - other known or             O35.9XX0   suspected (renal caliectasis)   ---------------------------------------------------------------------- Fetal Evaluation  Num Of Fetuses:         1  Fetal Heart Rate(bpm):  143  Cardiac Activity:       Observed  Presentation:           Transverse, head to maternal right  Placenta:               Posterior  P. Cord Insertion:      Visualized  Amniotic Fluid  AFI FV:      Within normal limits                              Largest Pocket(cm)  6.25 ---------------------------------------------------------------------- Biometry  BPD:      42.4  mm     G. Age:  18w 6d         43  %    CI:        77.29   %    70 - 86                                                          FL/HC:      17.2   %    16.1 - 18.3  HC:      152.7  mm     G. Age:  18w 2d         13  %    HC/AC:      1.13        1.09 - 1.39  AC:      135.3  mm     G. Age:  19w 0d         5  %    FL/BPD:     62.0   %  FL:       26.3  mm     G. Age:  18w 0d         12  %    FL/AC:      19.4   %    20 - 24  HUM:      26.8  mm     G. Age:  18w 4d         37  %  CER:        19  mm     G. Age:  18w 4d         35  %  NFT:       3.7  mm  CM:        5.6  mm  Est. FW:     243  gm      0 lb 9 oz     20  % ---------------------------------------------------------------------- OB History  Gravidity:    5         Term:   3        Prem:   0        SAB:   1  TOP:          0       Ectopic:  0        Living: 3 ---------------------------------------------------------------------- Gestational Age  LMP:           19w 0d        Date:  05/13/19                 EDD:   02/17/20  U/S Today:     18w 4d                                        EDD:   02/20/20  Best:          19w 0d     Det. By:  LMP  (05/13/19)          EDD:   02/17/20 ---------------------------------------------------------------------- Anatomy  Cranium:  Appears normal         LVOT:                   Appears normal  Cavum:                 Appears normal         Aortic Arch:            Appears normal  Ventricles:             Appears normal         Ductal Arch:            Appears normal  Choroid Plexus:        Left choroid           Diaphragm:              Appears normal                         plexus cyst  Cerebellum:            Appears normal         Stomach:                Appears normal, left                                                                        sided  Posterior Fossa:       Appears normal         Abdomen:                Appears normal  Nuchal Fold:           Appears normal         Abdominal Wall:         Appears nml (cord                                                                        insert, abd wall)  Face:                  Appears normal         Cord Vessels:           Appears normal (3                         (orbits and profile)                           vessel cord)  Lips:                  Appears normal         Kidneys:                Left caliectasis  Palate:  Not well visualized    Bladder:                Appears normal  Thoracic:              Appears normal         Spine:                  Appears normal  Heart:                 Appears normal         Upper Extremities:      Appears normal                         (4CH, axis, and                         situs)  RVOT:                  Appears normal         Lower Extremities:      Appears normal  Other:  Heels/feet and hands/right 5th digit visualized. Nasal bone visualized. ---------------------------------------------------------------------- Cervix Uterus Adnexa  Cervix  Length:            3.8  cm.  Normal appearance by transabdominal scan.  Uterus  No abnormality visualized.  Left Ovary  Within normal limits.  Right Ovary  Not visualized.  Cul De Sac  No free fluid seen.  Adnexa  No abnormality visualized. ---------------------------------------------------------------------- Impression  Normal growth.  Noted unlateral caliectasis  Good fetal movement and amniotic fluid.  Low risk NIPS  AMA 29  yo  I discussed today's findings  with Ms. Sherlon HandingRodriguez and  discussed the evaluation and management of todays  findings. She had no further questions. ---------------------------------------------------------------------- Recommendations  Given renal caliectasis and ama we have scheduled Ms.  Sherlon HandingRodriguez to return at 32 weeks. ----------------------------------------------------------------------               Lin Landsmanorenthian Booker, MD Electronically Signed Final Report   09/23/2019 03:19 pm ----------------------------------------------------------------------   Assessment and Plan:  Pregnancy: [id number] at 1655w2d 1. Supervision of other normal pregnancy, antepartum - patient doing well - routine prenatal care - anticipatory guidance on upcoming appointments with GTT at next visit, encouraged patient to fast prior to next appointment, patient verbalizes understanding  - Elastic Bandages & Supports (COMFORT FIT MATERNITY SUPP LG) MISC; 1 Units by Does not apply route daily.  Dispense: 1 each; Refill: 0  2. Umbilical hernia without obstruction and without gangrene - Patient reports hernia is not obstructed or painful, "just stay out"  - Encouraged use of maternity support belt to decrease tension on hernia, Rx for belt faxed over and mychart message with location to pick up belt sent to patient   3. History of cesarean section complicating pregnancy - Successful VBAC last year, plans VBAC, needs consent signed later in pregnancy   4. Fetal hydronephrosis during pregnancy, antepartum, single or unspecified fetus - F/u US scheduled for 32 weeks    Preterm labor symptoms and general obstetric precautions including but not limited to vaginal bleeding, contractions, leaking of fluid and fetal movement were reviewed in detail with the patient. I discussed the assessment and treatment plan with the patient. The patient was provided an opportunity to ask questions and all were answered. The patient agreed with the plan and demonstrated an  understanding of the instructions.  The patient was advised to call back or seek an in-person office evaluation/go to [facility] at [facility] for any urgent or concerning symptoms. Please refer to After Visit Summary for other counseling recommendations.   I provided 9 minutes of face-to-face time during this encounter.  Return in about 5 weeks (around 11/13/2019) for ROB/GTT.  Future Appointments  Date Time Provider Department Center  12/09/2019  1:15 PM WH-MFC NURSE WH-MFC MFC-US  12/09/2019  1:15 PM WH-MFC [place] 4 WH-MFCUS MFC-US    Sharyon Cable, CNM Center for [employer], [facility]

## 2019-11-13 ENCOUNTER — Ambulatory Visit (INDEPENDENT_AMBULATORY_CARE_PROVIDER_SITE_OTHER): Payer: Medicaid Other | Admitting: Nurse Practitioner

## 2019-11-13 ENCOUNTER — Other Ambulatory Visit: Payer: Self-pay

## 2019-11-13 ENCOUNTER — Other Ambulatory Visit: Payer: Medicaid Other

## 2019-11-13 VITALS — BP 118/76 | HR 94 | Wt 199.0 lb

## 2019-11-13 DIAGNOSIS — E669 Obesity, unspecified: Secondary | ICD-10-CM

## 2019-11-13 DIAGNOSIS — Z3480 Encounter for supervision of other normal pregnancy, unspecified trimester: Secondary | ICD-10-CM

## 2019-11-13 DIAGNOSIS — Z3A26 26 weeks gestation of pregnancy: Secondary | ICD-10-CM

## 2019-11-13 DIAGNOSIS — O34219 Maternal care for unspecified type scar from previous cesarean delivery: Secondary | ICD-10-CM

## 2019-11-13 DIAGNOSIS — Z98891 History of uterine scar from previous surgery: Secondary | ICD-10-CM

## 2019-11-13 NOTE — Progress Notes (Signed)
    Subjective:  Courtney [name] is a 29 y.o. [phone] at [redacted]w[redacted]d being seen today for ongoing prenatal care.  She is currently monitored for the following issues for this high-risk pregnancy and has Common migraine with intractable migraine; History of cesarean section complicating pregnancy; Umbilical hernia without obstruction and without gangrene; History of VBAC; Obesity with body mass index 30 or greater; Supervision of other normal pregnancy, antepartum; Cervical high risk HPV (human papillomavirus) test positive; and Fetal hydronephrosis during pregnancy, antepartum on their problem list.  Patient reports no complaints.  Contractions: Not present. Vag. Bleeding: None.  Movement: Present. Denies leaking of fluid.   The following portions of the patient's history were reviewed and updated as appropriate: allergies, current medications, past family history, past medical history, past social history, past surgical history and problem list. Problem list updated.  Objective:   Vitals:   11/13/19 0925  BP: 118/76  Pulse: 94  Weight: 199 lb (90.3 kg)    Fetal Status:   Fundal Height: 28 cm Movement: Present     General:  Alert, oriented and cooperative. Patient is in no acute distress.  Skin: Skin is warm and dry. No rash noted.   Cardiovascular: Normal heart rate noted  Respiratory: Normal respiratory effort, no problems with respiration noted  Abdomen: Soft, gravid, appropriate for gestational age. Pain/Pressure: Absent     Pelvic:  Cervical exam deferred        Extremities: Normal range of motion.  Edema: None  Mental Status: Normal mood and affect. Normal behavior. Normal judgment and thought content.   Urinalysis:      Assessment and Plan:  Pregnancy: [id number] at [redacted]w[redacted]d  1. Supervision of other normal pregnancy, antepartum Encouraged to take BP weekly and enter into [employer]s Sign consent for BTL today Declined TDAP  2. History of cesarean section complicating  pregnancy Will schedule in person ROB with MD top sign consent for VBAC   3. Obesity with body mass index 30 or greater   4. History of VBAC Need VBAC consent signed with MD Had one VBAC  Preterm labor symptoms and general obstetric precautions including but not limited to vaginal bleeding, contractions, leaking of fluid and fetal movement were reviewed in detail with the patient. Please refer to After Visit Summary for other counseling recommendations.  Return in about 2 weeks (around 11/27/2019) for in person with MD for delivery plan and consents signed.  Nolene Bernheim, RN, MSN, NP-BC Nurse Practitioner, [facility] for [employer], [facility] 11/13/2019 1:53 PM

## 2019-11-13 NOTE — Patient Instructions (Signed)
Postpartum Tubal Ligation Postpartum tubal ligation (PPTL) is a procedure to close the fallopian tubes. This is done so that you cannot get pregnant. When the fallopian tubes are closed, the eggs that the ovaries release cannot enter the uterus, and sperm cannot reach the eggs. PPTL is done right after childbirth or 1-2 days after childbirth, before the uterus returns to its normal location. If you have a cesarean section, it can be performed at the same time as the procedure. Having this done after childbirth does not make your stay in the hospital longer. PPTL is sometimes called "getting your tubes tied." You should not have this procedure if you want to get pregnant again or if you are unsure about having more children. Tell a health care provider about:  Any allergies you have.  All medicines you are taking, including vitamins, herbs, eye drops, creams, and over-the-counter medicines.  Any problems you or family members have had with anesthetic medicines.  Any blood disorders you have.  Any surgeries you have had.  Any medical conditions you have or have had.  Any past pregnancies. What are the risks? Generally, this is a safe procedure. However, problems may occur, including:  Infection.  Bleeding.  Injury to other organs in the abdomen.  Side effects from anesthetic medicines.  Failure of the procedure. If this happens, you could get pregnant.  Having a fertilized egg attach outside the uterus (ectopic pregnancy). What happens before the procedure?  Ask your health care provider about: ? How much pain you can expect to have. ? What medicines you will be given for pain, especially if you are planning to breastfeed. What happens during the procedure? If you had a vaginal delivery:  You will be given one or more of the following: ? A medicine to help you relax (sedative). ? A medicine to numb the area (local anesthetic). ? A medicine to make you fall asleep (general  anesthetic). ? A medicine that is injected into an area of your body to numb everything below the injection site (regional anesthetic).  If you have been given a general anesthetic, a tube will be put down your throat to help you breathe.  An IV will be inserted into one of your veins.  Your bladder may be emptied with a small tube (catheter).  An incision will be made just below your belly button.  Your fallopian tubes will be located and brought up through the incision.  Your fallopian tubes will be tied off, burned (cauterized), or blocked with a clip, ring, or clamp. A small part in the center of each fallopian tube may be removed.  The incision will be closed with stitches (sutures).  A bandage (dressing) will be placed over the incision. If you had a cesarean delivery:  Tubal ligation will be done through the incision that was used for the cesarean delivery of your baby.  The incision will be closed with sutures.  A dressing will be placed over the incision. The procedure may vary among health care providers and hospitals. What happens after the procedure?  Your blood pressure, heart rate, breathing rate, and blood oxygen level will be monitored until you leave the hospital.  You will be given pain medicine as needed.  Do not drive for 24 hours if you were given a sedative during your procedure. Summary  Postpartum tubal ligation is a procedure that closes the fallopian tubes so you cannot get pregnant anymore.  This procedure is done while you are still   in the hospital after childbirth. If you have a cesarean section, it can be performed at the same time.  Having this done after childbirth does not make your stay in the hospital longer.  Postpartum tubal ligation is considered permanent. You should not have this procedure if you want to get pregnant again or if you are unsure about having more children.  Talk to your health care provider to see if this procedure is  right for you. This information is not intended to replace advice given to you by your health care provider. Make sure you discuss any questions you have with your health care provider. Document Revised: 02/11/2019 Document Reviewed: 07/19/2018 Elsevier Patient Education  2020 Elsevier Inc.  

## 2019-11-14 LAB — GLUCOSE TOLERANCE, 2 HOURS W/ 1HR
Glucose, 1 hour: 111 mg/dL (ref 65–179)
Glucose, 2 hour: 111 mg/dL (ref 65–152)
Glucose, Fasting: 83 mg/dL (ref 65–91)

## 2019-11-14 LAB — CBC
Hematocrit: 35 % (ref 34.0–46.6)
Hemoglobin: 11.7 g/dL (ref 11.1–15.9)
MCH: 31.3 pg (ref 26.6–33.0)
MCHC: 33.4 g/dL (ref 31.5–35.7)
MCV: 94 fL (ref 79–97)
Platelets: 160 10*3/uL (ref 150–450)
RBC: 3.74 x10E6/uL — ABNORMAL LOW (ref 3.77–5.28)
RDW: 13.1 % (ref 11.7–15.4)
WBC: 9.2 10*3/uL (ref 3.4–10.8)

## 2019-11-14 LAB — RPR: RPR Ser Ql: NONREACTIVE

## 2019-11-14 LAB — HIV ANTIBODY (ROUTINE TESTING W REFLEX): HIV Screen 4th Generation wRfx: NONREACTIVE

## 2019-11-27 ENCOUNTER — Other Ambulatory Visit: Payer: Self-pay

## 2019-11-27 ENCOUNTER — Ambulatory Visit (INDEPENDENT_AMBULATORY_CARE_PROVIDER_SITE_OTHER): Payer: Medicaid Other | Admitting: Obstetrics and Gynecology

## 2019-11-27 ENCOUNTER — Encounter: Payer: Self-pay | Admitting: Obstetrics and Gynecology

## 2019-11-27 VITALS — BP 119/78 | HR 94 | Wt 200.0 lb

## 2019-11-27 DIAGNOSIS — O35EXX0 Maternal care for other (suspected) fetal abnormality and damage, fetal genitourinary anomalies, not applicable or unspecified: Secondary | ICD-10-CM

## 2019-11-27 DIAGNOSIS — Z3A28 28 weeks gestation of pregnancy: Secondary | ICD-10-CM

## 2019-11-27 DIAGNOSIS — O34219 Maternal care for unspecified type scar from previous cesarean delivery: Secondary | ICD-10-CM

## 2019-11-27 DIAGNOSIS — Z3480 Encounter for supervision of other normal pregnancy, unspecified trimester: Secondary | ICD-10-CM

## 2019-11-27 DIAGNOSIS — O358XX0 Maternal care for other (suspected) fetal abnormality and damage, not applicable or unspecified: Secondary | ICD-10-CM

## 2019-11-27 NOTE — Progress Notes (Signed)
   PRENATAL VISIT NOTE  Subjective:  Courtney [name] is a 29 y.o. [phone] at [redacted]w[redacted]d being seen today for ongoing prenatal care.  She is currently monitored for the following issues for this low-risk pregnancy and has Common migraine with intractable migraine; History of cesarean section complicating pregnancy; Umbilical hernia without obstruction and without gangrene; History of VBAC; Obesity with body mass index 30 or greater; Supervision of other normal pregnancy, antepartum; Cervical high risk HPV (human papillomavirus) test positive; and Fetal hydronephrosis during pregnancy, antepartum on their problem list.  Patient reports no complaints.  Contractions: Not present. Vag. Bleeding: None.  Movement: Present. Denies leaking of fluid.   The following portions of the patient's history were reviewed and updated as appropriate: allergies, current medications, past family history, past medical history, past social history, past surgical history and problem list.   Objective:   Vitals:   11/27/19 1405  BP: 119/78  Pulse: 94  Weight: 200 lb (90.7 kg)    Fetal Status: Fetal Heart Rate (bpm): 150   Movement: Present     General:  Alert, oriented and cooperative. Patient is in no acute distress.  Skin: Skin is warm and dry. No rash noted.   Cardiovascular: Normal heart rate noted  Respiratory: Normal respiratory effort, no problems with respiration noted  Abdomen: Soft, gravid, appropriate for gestational age.  Pain/Pressure: Absent     Pelvic: Cervical exam deferred        Extremities: Normal range of motion.     Mental Status: Normal mood and affect. Normal behavior. Normal judgment and thought content.   Assessment and Plan:  Pregnancy: G5P3013 at [redacted]w[redacted]d 1. Supervision of other normal pregnancy, antepartum Patient is doing well without complaints Reviewed labs results from last visit  2. History of cesarean section complicating pregnancy Patient desires another TOLAC. Consent form  signed  3. Fetal hydronephrosis during pregnancy, antepartum, single or unspecified fetus Follow up ultrasound on 3/29  Preterm labor symptoms and general obstetric precautions including but not limited to vaginal bleeding, contractions, leaking of fluid and fetal movement were reviewed in detail with the patient. Please refer to After Visit Summary for other counseling recommendations.   Return in about 2 weeks (around 12/11/2019) for Virtual, ROB, Low risk.  Future Appointments  Date Time Provider Department Center  12/09/2019  1:15 PM [facility] NURSE [facility]-US  12/09/2019  1:15 PM [facility] [place] 4 [facility]-M[facility]US MFC-US    Catalina Antigua, MD

## 2019-12-08 IMAGING — US US MFM OB FOLLOW-UP
1 series · 13 of 28 positions shown · non-contrast
Comparison: none

[Series 1: us mfm ob follow-up · 13 of 43 slices shown]
[im 2/43]
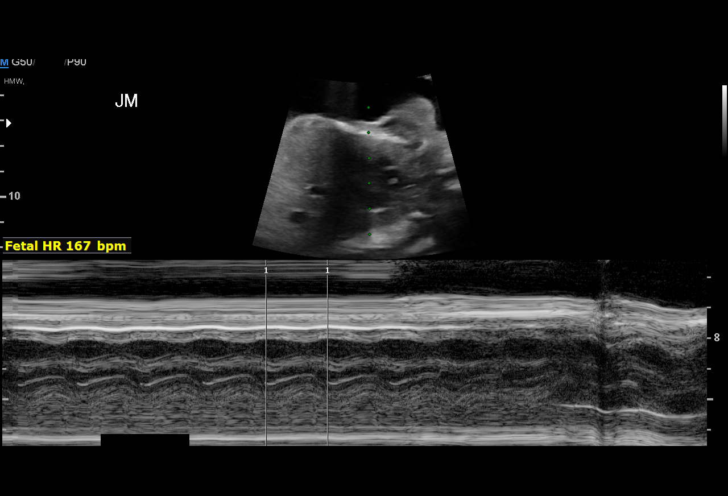
[im 5/43]
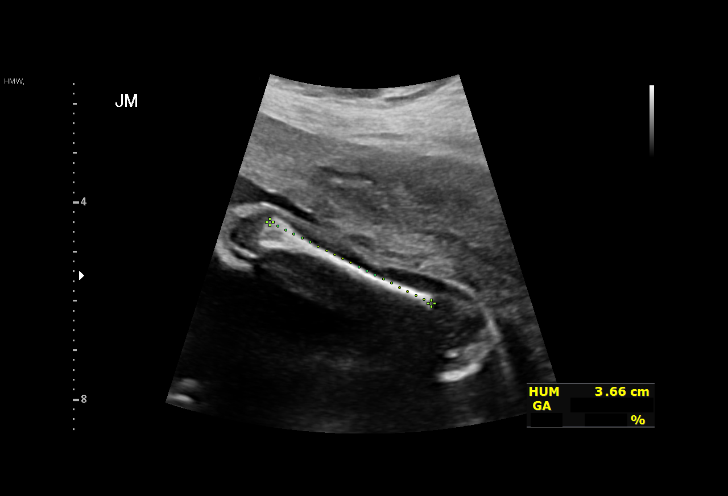
[im 8/43]
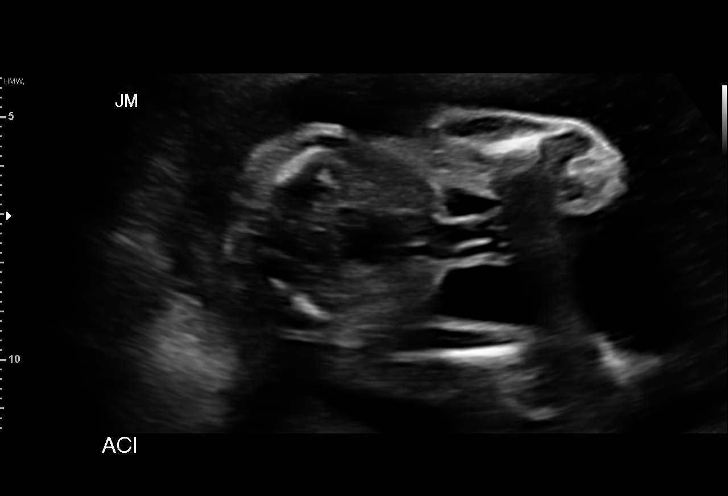
[im 11/43]
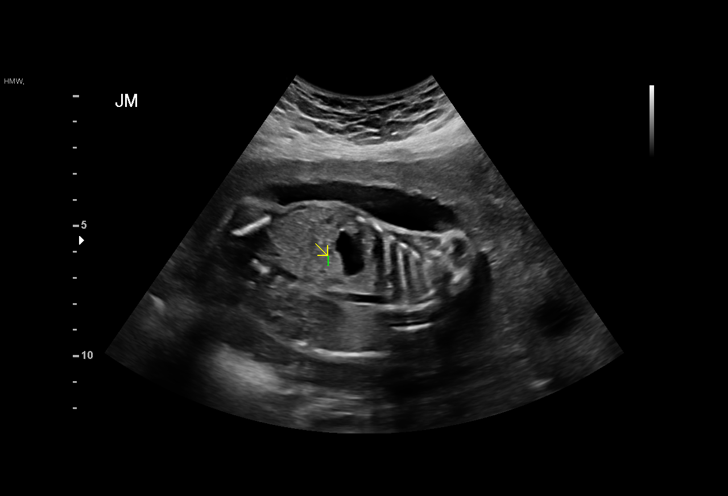
[im 15/43]
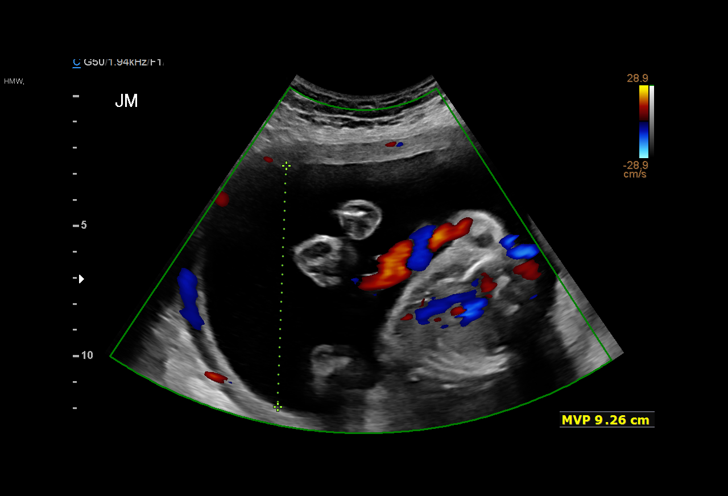
[im 18/43]
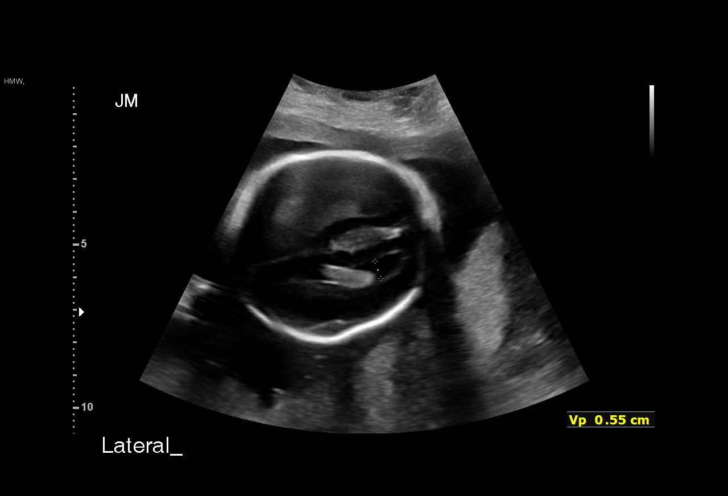
[im 22/43]
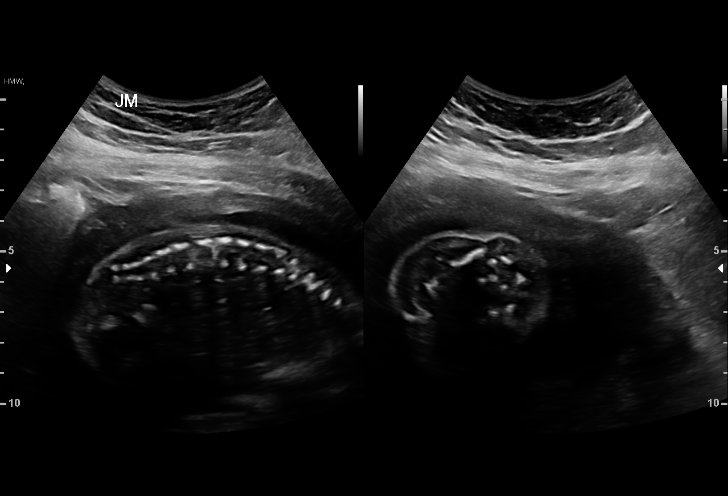
[im 25/43]
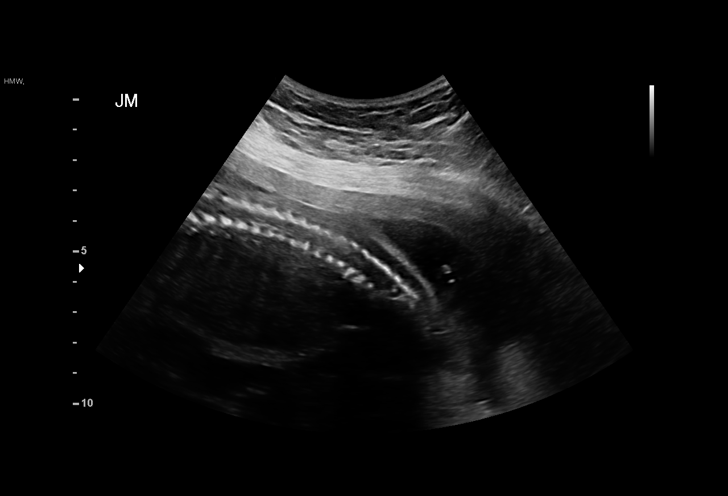
[im 29/43]
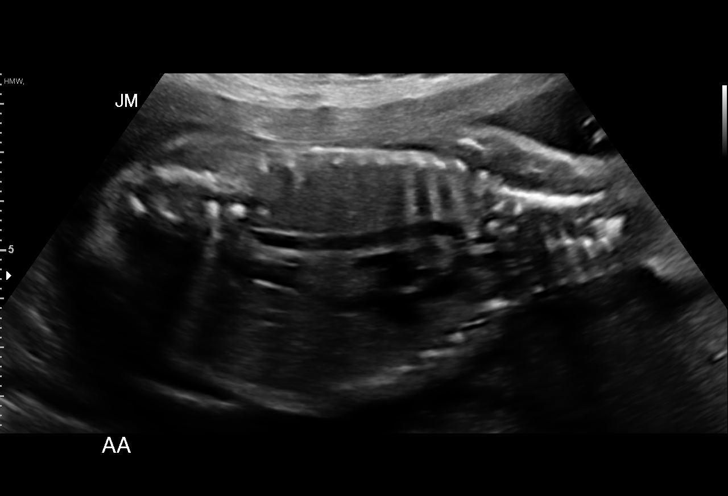
[im 32/43]
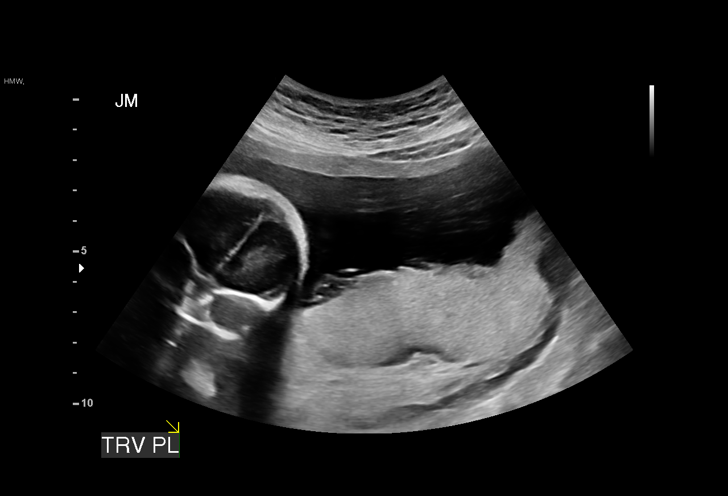
[im 35/43]
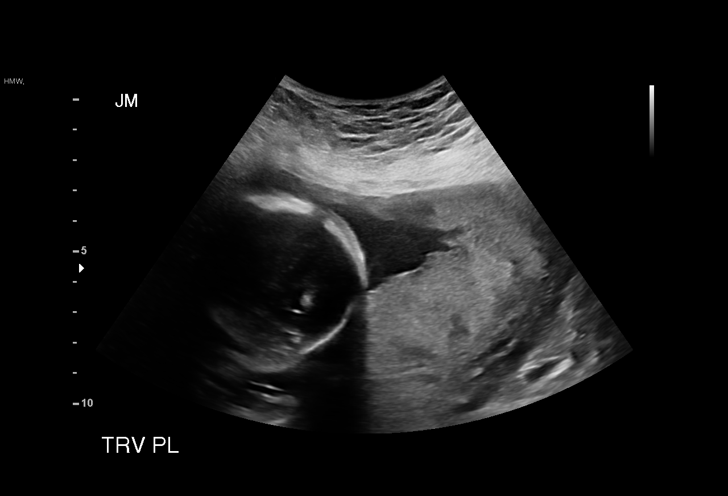
[im 38/43]
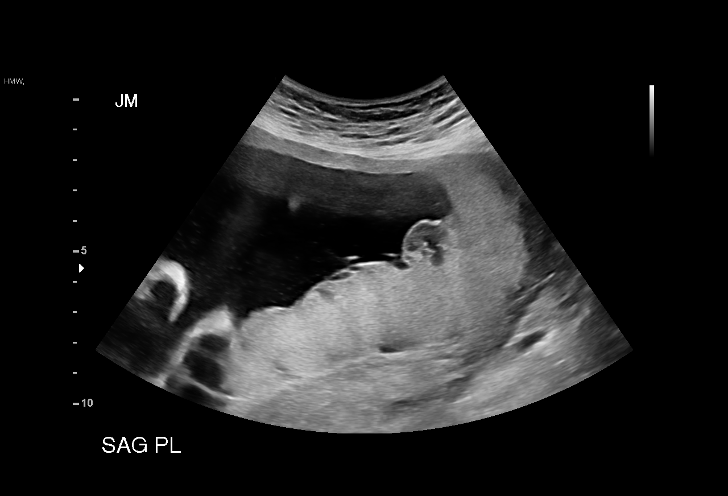
[im 41/43]
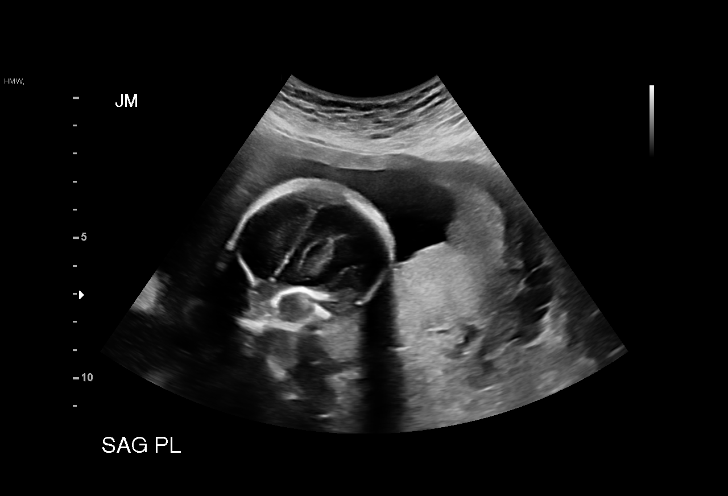

[13 of 28 positions shown; findings below may reference images not displayed]

NP

 ----------------------------------------------------------------------

 ----------------------------------------------------------------------
Indications

  Previous cesarean delivery, antepartum
  Obesity complicating pregnancy, second
  trimester (pregravid BMI 33.03)
  Short interval between pregancies, 2nd
  trimester
  Encounter for other antenatal screening
  follow-up (low risk NIPS, AFP neg)
  23 weeks gestation of pregnancy
 ----------------------------------------------------------------------
Fetal Evaluation

 Num Of Fetuses:          1
 Fetal Heart Rate(bpm):   167
 Cardiac Activity:        Observed
 Presentation:            Cephalic
 Placenta:                Right lateral
 P. Cord Insertion:       Visualized, central

 Amniotic Fluid
 AFI FV:      Within normal limits

                             Largest Pocket(cm)

Biometry

 BPD:      56.4   mm     G. Age:  23w 2d         48  %    CI:         72.91  %    70 - 86
                                                          FL/HC:       19.4  %    19.2 -
 HC:        210   mm     G. Age:  23w 1d         33  %    HC/AC:       1.04       1.05 -
 AC:      201.3   mm     G. Age:  24w 5d         86  %    FL/BPD:      72.2  %    71 - 87
 FL:       40.7   mm     G. Age:  23w 1d         40  %    FL/AC:       20.2  %    20 - 24
 HUM:      37.3   mm     G. Age:  23w 0d         41  %

 LV:         5.5  mm

 Est. FW:     644   gm      1 lb 7 oz     66  %
OB History

 Gravidity:     4         Term:  2          Prem:  0        SAB:   1
 TOP:           0       Ectopic: 0         Living: 2
Gestational Age

 LMP:            23w 1d       Date:  06/03/18                   EDD:  03/10/19
 U/S Today:      23w 4d                                         EDD:  03/07/19
 Best:           23w 1d    Det. By:  LMP  (06/03/18)            EDD:  03/10/19
Anatomy

 Cranium:                Appears normal         Aortic Arch:            Appears normal
 Cavum:                  Previously seen        Ductal Arch:            Previously seen
 Ventricles:             Appears normal         Diaphragm:              Previously seen
 Choroid Plexus:         Previously seen        Stomach:                Appears normal, left
                                                                        sided
 Cerebellum:             Appears normal         Abdomen:                Previously seen
 Posterior Fossa:        Previously seen        Abdominal Wall:         Appears nml (cord
                                                                        insert, abd wall)
 Nuchal Fold:            Previously seen        Cord Vessels:           Previously seen
 Face:                   Orbits and profile     Kidneys:                Appear normal
                         previously seen
 Lips:                   Previously seen        Bladder:                Appears normal
 Thoracic:               Appears normal         Spine:                  Appears normal
 Heart:                  Appears normal         Upper Extremities:      Previously seen
                         (4CH, axis, and situs)
 RVOT:                   Previously seen        Lower Extremities:      Previously seen
 LVOT:                   Previously seen

 Other:   Fetus appears to be a male. Heels and 5th digit visualized previously.
          Nasal bone visualized previously.
Cervix Uterus Adnexa

 Cervix
 Not visualized (advanced GA >63wks)

 Uterus
 No abnormality visualized.

 Left Ovary
 No adnexal mass visualized.

 Right Ovary
 No adnexal mass visualized.

 Cul De Sac
 No free fluid seen.
 Adnexa
 No abnormality visualized.
Impression

 Normal interval growth.
 Anatomy completed today.
Recommendations

 Follow up growth as clinically indicated.

## 2019-12-09 ENCOUNTER — Ambulatory Visit (HOSPITAL_COMMUNITY)
Admission: RE | Admit: 2019-12-09 | Discharge: 2019-12-09 | Disposition: A | Discharge status: Home / Self Care | Payer: Medicaid Other | Source: Ambulatory Visit | Attending: Obstetrics and Gynecology | Admitting: Obstetrics and Gynecology

## 2019-12-09 ENCOUNTER — Ambulatory Visit (HOSPITAL_COMMUNITY): Payer: Medicaid Other | Admitting: *Deleted

## 2019-12-09 ENCOUNTER — Other Ambulatory Visit (HOSPITAL_COMMUNITY): Payer: Self-pay | Admitting: *Deleted

## 2019-12-09 ENCOUNTER — Other Ambulatory Visit: Payer: Self-pay

## 2019-12-09 ENCOUNTER — Encounter (HOSPITAL_COMMUNITY): Payer: Self-pay

## 2019-12-09 VITALS — BP 107/59 | HR 111 | Temp 97.4°F

## 2019-12-09 DIAGNOSIS — O283 Abnormal ultrasonic finding on antenatal screening of mother: Secondary | ICD-10-CM | POA: Insufficient documentation

## 2019-12-09 DIAGNOSIS — O359XX0 Maternal care for (suspected) fetal abnormality and damage, unspecified, not applicable or unspecified: Secondary | ICD-10-CM | POA: Diagnosis not present

## 2019-12-09 DIAGNOSIS — N2889 Other specified disorders of kidney and ureter: Secondary | ICD-10-CM | POA: Insufficient documentation

## 2019-12-09 DIAGNOSIS — Z3A30 30 weeks gestation of pregnancy: Secondary | ICD-10-CM

## 2019-12-09 DIAGNOSIS — N1330 Unspecified hydronephrosis: Secondary | ICD-10-CM

## 2019-12-09 DIAGNOSIS — Z362 Encounter for other antenatal screening follow-up: Secondary | ICD-10-CM | POA: Diagnosis not present

## 2019-12-11 ENCOUNTER — Telehealth (INDEPENDENT_AMBULATORY_CARE_PROVIDER_SITE_OTHER): Payer: Medicaid Other | Admitting: Obstetrics

## 2019-12-11 ENCOUNTER — Encounter: Payer: Self-pay | Admitting: Obstetrics

## 2019-12-11 DIAGNOSIS — O99213 Obesity complicating pregnancy, third trimester: Secondary | ICD-10-CM

## 2019-12-11 DIAGNOSIS — O09899 Supervision of other high risk pregnancies, unspecified trimester: Secondary | ICD-10-CM | POA: Diagnosis not present

## 2019-12-11 DIAGNOSIS — O0990 Supervision of high risk pregnancy, unspecified, unspecified trimester: Secondary | ICD-10-CM

## 2019-12-11 DIAGNOSIS — E669 Obesity, unspecified: Secondary | ICD-10-CM

## 2019-12-11 DIAGNOSIS — O34219 Maternal care for unspecified type scar from previous cesarean delivery: Secondary | ICD-10-CM

## 2019-12-11 DIAGNOSIS — Z3A30 30 weeks gestation of pregnancy: Secondary | ICD-10-CM

## 2019-12-11 DIAGNOSIS — Z98891 History of uterine scar from previous surgery: Secondary | ICD-10-CM

## 2019-12-11 NOTE — Progress Notes (Signed)
   TELEHEALTH VIRTUAL OBSTETRICS VISIT ENCOUNTER NOTE  I connected with Courtney [name] on 12/11/19 at  3:00 PM EDT by telephone at home and verified that I am speaking with the correct person using two identifiers.   I discussed the limitations, risks, security and privacy concerns of performing an evaluation and management service by telephone and the availability of in person appointments. I also discussed with the patient that there may be a patient responsible charge related to this service. The patient expressed understanding and agreed to proceed.  Subjective:  Courtney [name] is a 29 y.o. [phone] at [redacted]w[redacted]d being followed for ongoing prenatal care.  She is currently monitored for the following issues for this high-risk pregnancy and has Common migraine with intractable migraine; History of cesarean section complicating pregnancy; Umbilical hernia without obstruction and without gangrene; History of VBAC; Obesity with body mass index 30 or greater; Supervision of other normal pregnancy, antepartum; Cervical high risk HPV (human papillomavirus) test positive; and Fetal hydronephrosis during pregnancy, antepartum on their problem list.  Patient reports backache. Reports fetal movement. Denies any contractions, bleeding or leaking of fluid.   The following portions of the patient's history were reviewed and updated as appropriate: allergies, current medications, past family history, past medical history, past social history, past surgical history and problem list.   Objective:   General:  Alert, oriented and cooperative.   Mental Status: Normal mood and affect perceived. Normal judgment and thought content.  Rest of physical exam deferred due to type of encounter  Assessment and Plan:  Pregnancy: G5P3013 at [redacted]w[redacted]d 1. Supervision of high risk pregnancy, antepartum  2. Short interval between pregnancies complicating pregnancy, antepartum  3. History of cesarean section complicating  pregnancy  4. History of VBAC  5. Obesity with body mass index 30 or greater   Preterm labor symptoms and general obstetric precautions including but not limited to vaginal bleeding, contractions, leaking of fluid and fetal movement were reviewed in detail with the patient.  I discussed the assessment and treatment plan with the patient. The patient was provided an opportunity to ask questions and all were answered. The patient agreed with the plan and demonstrated an understanding of the instructions. The patient was advised to call back or seek an in-person office evaluation/go to MAU at [facility] for any urgent or concerning symptoms. Please refer to After Visit Summary for other counseling recommendations.   I provided 10 minutes of non-face-to-face time during this encounter.  Return in about 2 weeks (around 12/25/2019) for MyChart.  Future Appointments  Date Time Provider Department Center  12/11/2019  3:00 PM Brock Bad, MD [facility] None  01/06/2020  3:30 PM [facility] NURSE [facility] MFC-US  01/06/2020  3:30 PM [facility] [place] 1 [facility] M[facility]-US    Coral Ceo, MD Center for [facility], [facility] 12/11/2019

## 2019-12-11 NOTE — Progress Notes (Signed)
Virtual ROB   CC: None  

## 2019-12-25 ENCOUNTER — Telehealth (INDEPENDENT_AMBULATORY_CARE_PROVIDER_SITE_OTHER): Payer: Medicaid Other | Admitting: Obstetrics & Gynecology

## 2019-12-25 VITALS — BP 138/86

## 2019-12-25 DIAGNOSIS — O358XX0 Maternal care for other (suspected) fetal abnormality and damage, not applicable or unspecified: Secondary | ICD-10-CM

## 2019-12-25 DIAGNOSIS — O34219 Maternal care for unspecified type scar from previous cesarean delivery: Secondary | ICD-10-CM

## 2019-12-25 DIAGNOSIS — Z3A32 32 weeks gestation of pregnancy: Secondary | ICD-10-CM | POA: Diagnosis not present

## 2019-12-25 DIAGNOSIS — O35EXX0 Maternal care for other (suspected) fetal abnormality and damage, fetal genitourinary anomalies, not applicable or unspecified: Secondary | ICD-10-CM

## 2019-12-25 DIAGNOSIS — Z3480 Encounter for supervision of other normal pregnancy, unspecified trimester: Secondary | ICD-10-CM

## 2019-12-25 DIAGNOSIS — Z98891 History of uterine scar from previous surgery: Secondary | ICD-10-CM

## 2019-12-25 NOTE — Progress Notes (Signed)
   TELEHEALTH VIRTUAL OBSTETRICS VISIT ENCOUNTER NOTE  I connected with Courtney [name] on 12/25/19 at 11:15 AM EDT by telephone at home and verified that I am speaking with the correct person using two identifiers.   I discussed the limitations, risks, security and privacy concerns of performing an evaluation and management service by telephone and the availability of in person appointments. I also discussed with the patient that there may be a patient responsible charge related to this service. The patient expressed understanding and agreed to proceed.  Subjective:  Courtney [name] is a 29 y.o. [phone] at [redacted]w[redacted]d being followed for ongoing prenatal care.  She is currently monitored for the following issues for this high-risk pregnancy and has Common migraine with intractable migraine; History of cesarean section complicating pregnancy; Umbilical hernia without obstruction and without gangrene; History of VBAC; Obesity with body mass index 30 or greater; Supervision of other normal pregnancy, antepartum; Cervical high risk HPV (human papillomavirus) test positive; and Fetal hydronephrosis during pregnancy, antepartum on their problem list.  Patient reports no complaints. Reports fetal movement. Denies any contractions, bleeding or leaking of fluid.   The following portions of the patient's history were reviewed and updated as appropriate: allergies, current medications, past family history, past medical history, past social history, past surgical history and problem list.   Objective:   General:  Alert, oriented and cooperative.   Mental Status: Normal mood and affect perceived. Normal judgment and thought content.  Rest of physical exam deferred due to type of encounter  Assessment and Plan:  Pregnancy: G5P3013 at [redacted]w[redacted]d 1. History of VBAC Plans Tolac  2. Supervision of other normal pregnancy, antepartum BP borderline elevated today, needs in person visit soon  3. Fetal  hydronephrosis during pregnancy, antepartum, single or unspecified fetus F/u US 4/26  Preterm labor symptoms and general obstetric precautions including but not limited to vaginal bleeding, contractions, leaking of fluid and fetal movement were reviewed in detail with the patient.  I discussed the assessment and treatment plan with the patient. The patient was provided an opportunity to ask questions and all were answered. The patient agreed with the plan and demonstrated an understanding of the instructions. The patient was advised to call back or seek an in-person office evaluation/go to [facility] at [facility] for any urgent or concerning symptoms. Please refer to After Visit Summary for other counseling recommendations.   I provided 11 minutes of non-face-to-face time during this encounter.  Return in about 1 week (around 01/01/2020) for in person.  Future Appointments  Date Time Provider Department Center  01/06/2020  3:30 PM [facility] NURSE [facility] MFC-US  01/06/2020  3:30 PM [facility] [place] 1 [facility] MFC-US    Scheryl Darter, MD Center for [facility], [facility]

## 2019-12-25 NOTE — Patient Instructions (Signed)

## 2019-12-25 NOTE — Progress Notes (Signed)
ROB Virtual   CC: None

## 2019-12-25 NOTE — Progress Notes (Signed)
error 

## 2020-01-06 ENCOUNTER — Encounter (HOSPITAL_COMMUNITY): Payer: Self-pay

## 2020-01-06 ENCOUNTER — Ambulatory Visit (HOSPITAL_COMMUNITY)
Admission: RE | Admit: 2020-01-06 | Discharge: 2020-01-06 | Disposition: A | Discharge status: Home / Self Care | Payer: Medicaid Other | Source: Ambulatory Visit | Attending: Obstetrics and Gynecology | Admitting: Obstetrics and Gynecology

## 2020-01-06 ENCOUNTER — Other Ambulatory Visit: Payer: Self-pay

## 2020-01-06 ENCOUNTER — Ambulatory Visit (HOSPITAL_COMMUNITY): Payer: Medicaid Other | Admitting: *Deleted

## 2020-01-06 VITALS — BP 127/78 | HR 103 | Temp 97.9°F

## 2020-01-06 DIAGNOSIS — Z362 Encounter for other antenatal screening follow-up: Secondary | ICD-10-CM | POA: Diagnosis present

## 2020-01-06 DIAGNOSIS — Z3A34 34 weeks gestation of pregnancy: Secondary | ICD-10-CM | POA: Diagnosis not present

## 2020-01-06 DIAGNOSIS — O359XX0 Maternal care for (suspected) fetal abnormality and damage, unspecified, not applicable or unspecified: Secondary | ICD-10-CM | POA: Diagnosis not present

## 2020-01-06 DIAGNOSIS — N1330 Unspecified hydronephrosis: Secondary | ICD-10-CM | POA: Insufficient documentation

## 2020-01-14 ENCOUNTER — Ambulatory Visit (INDEPENDENT_AMBULATORY_CARE_PROVIDER_SITE_OTHER): Payer: Medicaid Other | Admitting: Obstetrics & Gynecology

## 2020-01-14 ENCOUNTER — Other Ambulatory Visit: Payer: Self-pay

## 2020-01-14 DIAGNOSIS — Z3480 Encounter for supervision of other normal pregnancy, unspecified trimester: Secondary | ICD-10-CM

## 2020-01-14 NOTE — Progress Notes (Signed)
   PRENATAL VISIT NOTE  Subjective:  Courtney [name] is a 29 y.o. [phone] at [redacted]w[redacted]d being seen today for ongoing prenatal care.  She is currently monitored for the following issues for this high-risk pregnancy and has Common migraine with intractable migraine; History of cesarean section complicating pregnancy; Umbilical hernia without obstruction and without gangrene; History of VBAC; Obesity with body mass index 30 or greater; Supervision of other normal pregnancy, antepartum; Cervical high risk HPV (human papillomavirus) test positive; and Fetal hydronephrosis during pregnancy, antepartum on their problem list.  Patient reports occasional contractions.  Contractions: Irregular. Vag. Bleeding: None.  Movement: Present. Denies leaking of fluid.   The following portions of the patient's history were reviewed and updated as appropriate: allergies, current medications, past family history, past medical history, past social history, past surgical history and problem list.   Objective:   Vitals:   01/14/20 0848  BP: 133/86  Pulse: 98  Weight: 206 lb 14.4 oz (93.8 kg)    Fetal Status: Fetal Heart Rate (bpm): 148 Fundal Height: 36 cm Movement: Present     General:  Alert, oriented and cooperative. Patient is in no acute distress.  Skin: Skin is warm and dry. No rash noted.   Cardiovascular: Normal heart rate noted  Respiratory: Normal respiratory effort, no problems with respiration noted  Abdomen: Soft, gravid, appropriate for gestational age.  Pain/Pressure: Present     Pelvic: Cervical exam deferred        Extremities: Normal range of motion.  Edema: None  Mental Status: Normal mood and affect. Normal behavior. Normal judgment and thought content.   Assessment and Plan:  Pregnancy: [id number] at [redacted]w[redacted]d 1. Supervision of other normal pregnancy, antepartum Previous VBAC [place] result reviewed Preterm labor symptoms and general obstetric precautions including but not limited to vaginal  bleeding, contractions, leaking of fluid and fetal movement were reviewed in detail with the patient. Please refer to After Visit Summary for other counseling recommendations.   Return for GBS.  No future appointments.  Scheryl Darter, MD

## 2020-01-14 NOTE — Patient Instructions (Signed)
Vaginal Birth After Cesarean Delivery  Vaginal birth after cesarean delivery (VBAC) is giving birth vaginally after previously delivering a baby through a cesarean section (C-section). A VBAC may be a safe option for you, depending on your health and other factors. It is important to discuss VBAC with your health care provider early in your pregnancy so you can understand the risks, benefits, and options. Having these discussions early will give you time to make your birth plan. Who are the best candidates for VBAC? The best candidates for VBAC are women who:  Have had one or two prior cesarean deliveries, and the incision made during the delivery was horizontal (low transverse).  Do not have a vertical (classical) scar on their uterus.  Have not had a tear in the wall of their uterus (uterine rupture).  Plan to have more pregnancies. A VBAC is also more likely to be successful:  In women who have previously given birth vaginally.  When labor starts by itself (spontaneously) before the due date. What are the benefits of VBAC? The benefits of delivering your baby vaginally instead of by a cesarean delivery include:  A shorter hospital stay.  A faster recovery time.  Less pain.  Avoiding risks associated with major surgery, such as infection and blood clots.  Less blood loss and less need for donated blood (transfusions). What are the risks of VBAC? The main risk of attempting a VBAC is that it may fail, forcing your health care provider to deliver your baby by a C-section. Other risks are rare and include:  Tearing (rupture) of the scar from a past cesarean delivery.  Other risks associated with vaginal deliveries. If a repeat cesarean delivery is needed, the risks include:  Blood loss.  Infection.  Blood clot.  Damage to surrounding organs.  Removal of the uterus (hysterectomy), if it is damaged.  Placenta problems in future pregnancies. What else should I know  about my options? Delivering a baby through a VBAC is similar to having a normal spontaneous vaginal delivery. Therefore, it is safe:  To try with twins.  For your health care provider to try to turn the baby from a breech position (external cephalic version) during labor.  With epidural analgesia for pain relief. Consider where you would like to deliver your baby. VBAC should be attempted in facilities where an emergency cesarean delivery can be performed. VBAC is not recommended for home births. Any changes in your health or your baby's health during your pregnancy may make it necessary to change your initial decision about VBAC. Your health care provider may recommend that you do not attempt a VBAC if:  Your baby's suspected weight is 8.8 lb (4 kg) or more.  You have preeclampsia. This is a condition that causes high blood pressure along with other symptoms, such as swelling and headaches.  You will have VBAC less than 19 months after your cesarean delivery.  You are past your due date.  You need to have labor started (induced) because your cervix is not ready for labor (unfavorable). Where to find more information  American Pregnancy Association: americanpregnancy.org  American Congress of Obstetricians and Gynecologists: acog.org Summary  Vaginal birth after cesarean delivery (VBAC) is giving birth vaginally after previously delivering a baby through a cesarean section (C-section). A VBAC may be a safe option for you, depending on your health and other factors.  Discuss VBAC with your health care provider early in your pregnancy so you can understand the risks, benefits, options, and   have plenty of time to make your birth plan.  The main risk of attempting a VBAC is that it may fail, forcing your health care provider to deliver your baby by a C-section. Other risks are rare. This information is not intended to replace advice given to you by your health care provider. Make sure  you discuss any questions you have with your health care provider. Document Revised: 12/25/2018 Document Reviewed: 12/06/2016 Elsevier Patient Education  2020 Elsevier Inc.  

## 2020-01-14 NOTE — Progress Notes (Signed)
Patient reports fetal movement with irregular contractions. 

## 2020-01-22 ENCOUNTER — Other Ambulatory Visit: Payer: Self-pay

## 2020-01-22 ENCOUNTER — Ambulatory Visit (INDEPENDENT_AMBULATORY_CARE_PROVIDER_SITE_OTHER): Payer: Medicaid Other | Admitting: Advanced Practice Midwife

## 2020-01-22 ENCOUNTER — Other Ambulatory Visit (HOSPITAL_COMMUNITY)
Admission: RE | Admit: 2020-01-22 | Discharge: 2020-01-22 | Disposition: A | Discharge status: Home / Self Care | Payer: Medicaid Other | Source: Ambulatory Visit | Attending: Advanced Practice Midwife | Admitting: Advanced Practice Midwife

## 2020-01-22 ENCOUNTER — Encounter: Payer: Self-pay | Admitting: Advanced Practice Midwife

## 2020-01-22 VITALS — BP 127/79 | HR 88 | Wt 211.5 lb

## 2020-01-22 DIAGNOSIS — Z3A36 36 weeks gestation of pregnancy: Secondary | ICD-10-CM

## 2020-01-22 DIAGNOSIS — Z3483 Encounter for supervision of other normal pregnancy, third trimester: Secondary | ICD-10-CM

## 2020-01-22 DIAGNOSIS — Z3480 Encounter for supervision of other normal pregnancy, unspecified trimester: Secondary | ICD-10-CM | POA: Insufficient documentation

## 2020-01-22 NOTE — Progress Notes (Signed)
Patient reports fetal movement with irregular contractions. Pt would like to leave a urine sample because she is complaining of frequent urination.

## 2020-01-22 NOTE — Progress Notes (Signed)
   PRENATAL VISIT NOTE  Subjective:  Courtney [name] is a 29 y.o. [phone] at [redacted]w[redacted]d being seen today for ongoing prenatal care.  She is currently monitored for the following issues for this low-risk pregnancy and has Common migraine with intractable migraine; History of cesarean section complicating pregnancy; Umbilical hernia without obstruction and without gangrene; History of VBAC; Obesity with body mass index 30 or greater; Supervision of other normal pregnancy, antepartum; Cervical high risk HPV (human papillomavirus) test positive; and Fetal hydronephrosis during pregnancy, antepartum on their problem list.  Patient reports occasional contractions and sciatica pain.  Contractions: Irregular. Vag. Bleeding: None.  Movement: Present. Denies leaking of fluid.   The following portions of the patient's history were reviewed and updated as appropriate: allergies, current medications, past family history, past medical history, past social history, past surgical history and problem list.   Objective:   Vitals:   01/22/20 0919  BP: 127/79  Pulse: 88  Weight: 211 lb 8 oz (95.9 kg)    Fetal Status: Fetal Heart Rate (bpm): 154 Fundal Height: 37 cm Movement: Present     General:  Alert, oriented and cooperative. Patient is in no acute distress.  Skin: Skin is warm and dry. No rash noted.   Cardiovascular: Normal heart rate noted  Respiratory: Normal respiratory effort, no problems with respiration noted  Abdomen: Soft, gravid, appropriate for gestational age.  Pain/Pressure: Present     Pelvic: Cervical exam performed in the presence of a chaperone Dilation: Fingertip Effacement (%): 40 Station: -3  Extremities: Normal range of motion.  Edema: None  Mental Status: Normal mood and affect. Normal behavior. Normal judgment and thought content.   Assessment and Plan:  Pregnancy: [id number] at [redacted]w[redacted]d 1. Supervision of other normal pregnancy, antepartum - Strep Gp B NAA - Cervicovaginal ancillary  only( [place]) - Culture, OB Urine  Term labor symptoms and general obstetric precautions including but not limited to vaginal bleeding, contractions, leaking of fluid and fetal movement were reviewed in detail with the patient. Please refer to After Visit Summary for other counseling recommendations.   Return in about 1 week (around 01/29/2020) for Virtual visit .  No future appointments.  Thressa Sheller DNP, CNM  01/22/20  9:46 AM

## 2020-01-23 LAB — CERVICOVAGINAL ANCILLARY ONLY
Chlamydia: NEGATIVE
Comment: NEGATIVE
Comment: NORMAL
Neisseria Gonorrhea: NEGATIVE

## 2020-01-24 LAB — STREP GP B NAA: Strep Gp B NAA: NEGATIVE

## 2020-01-24 LAB — CULTURE, OB URINE

## 2020-01-24 LAB — URINE CULTURE, OB REFLEX

## 2020-01-29 ENCOUNTER — Other Ambulatory Visit: Payer: Self-pay

## 2020-01-29 ENCOUNTER — Telehealth (INDEPENDENT_AMBULATORY_CARE_PROVIDER_SITE_OTHER): Payer: Medicaid Other | Admitting: Obstetrics and Gynecology

## 2020-01-29 ENCOUNTER — Inpatient Hospital Stay (HOSPITAL_COMMUNITY)
Admission: AD | Admit: 2020-01-29 | Discharge: 2020-01-29 | Disposition: A | Discharge status: Home / Self Care | Payer: Medicaid Other | Attending: Obstetrics and Gynecology | Admitting: Obstetrics and Gynecology

## 2020-01-29 ENCOUNTER — Encounter (HOSPITAL_COMMUNITY): Payer: Self-pay | Admitting: Obstetrics and Gynecology

## 2020-01-29 ENCOUNTER — Encounter: Payer: Self-pay | Admitting: Obstetrics and Gynecology

## 2020-01-29 VITALS — BP 130/94

## 2020-01-29 DIAGNOSIS — O26893 Other specified pregnancy related conditions, third trimester: Secondary | ICD-10-CM

## 2020-01-29 DIAGNOSIS — E669 Obesity, unspecified: Secondary | ICD-10-CM

## 2020-01-29 DIAGNOSIS — Z0130 Encounter for examination of blood pressure without abnormal findings: Secondary | ICD-10-CM | POA: Diagnosis not present

## 2020-01-29 DIAGNOSIS — O34219 Maternal care for unspecified type scar from previous cesarean delivery: Secondary | ICD-10-CM | POA: Insufficient documentation

## 2020-01-29 DIAGNOSIS — Z3009 Encounter for other general counseling and advice on contraception: Secondary | ICD-10-CM | POA: Insufficient documentation

## 2020-01-29 DIAGNOSIS — Z3480 Encounter for supervision of other normal pregnancy, unspecified trimester: Secondary | ICD-10-CM

## 2020-01-29 DIAGNOSIS — R030 Elevated blood-pressure reading, without diagnosis of hypertension: Secondary | ICD-10-CM

## 2020-01-29 DIAGNOSIS — Z3A37 37 weeks gestation of pregnancy: Secondary | ICD-10-CM | POA: Diagnosis not present

## 2020-01-29 DIAGNOSIS — Z98891 History of uterine scar from previous surgery: Secondary | ICD-10-CM

## 2020-01-29 LAB — URINALYSIS, ROUTINE W REFLEX MICROSCOPIC
Bilirubin Urine: NEGATIVE
Glucose, UA: NEGATIVE mg/dL
Ketones, ur: NEGATIVE mg/dL
Nitrite: NEGATIVE
Protein, ur: NEGATIVE mg/dL
Specific Gravity, Urine: 1.026 (ref 1.005–1.030)
pH: 6 (ref 5.0–8.0)

## 2020-01-29 LAB — COMPREHENSIVE METABOLIC PANEL
ALT: 13 U/L (ref 0–44)
AST: 16 U/L (ref 15–41)
Albumin: 3 g/dL — ABNORMAL LOW (ref 3.5–5.0)
Alkaline Phosphatase: 96 U/L (ref 38–126)
Anion gap: 11 (ref 5–15)
BUN: 11 mg/dL (ref 6–20)
CO2: 22 mmol/L (ref 22–32)
Calcium: 9.3 mg/dL (ref 8.9–10.3)
Chloride: 105 mmol/L (ref 98–111)
Creatinine, Ser: 0.48 mg/dL (ref 0.44–1.00)
GFR calc Af Amer: >60 mL/min (ref >60)
GFR calc non Af Amer: >60 mL/min (ref >60)
Glucose, Bld: 107 mg/dL — ABNORMAL HIGH (ref 70–99)
Potassium: 4.1 mmol/L (ref 3.5–5.1)
Sodium: 138 mmol/L (ref 135–145)
Total Bilirubin: 0.5 mg/dL (ref 0.3–1.2)
Total Protein: 6.9 g/dL (ref 6.5–8.1)

## 2020-01-29 LAB — CBC
HCT: 35.2 % — ABNORMAL LOW (ref 36.0–46.0)
Hemoglobin: 11.7 g/dL — ABNORMAL LOW (ref 12.0–15.0)
MCH: 30.5 pg (ref 26.0–34.0)
MCHC: 33.2 g/dL (ref 30.0–36.0)
MCV: 91.7 fL (ref 80.0–100.0)
Platelets: 175 10*3/uL (ref 150–400)
RBC: 3.84 MIL/uL — ABNORMAL LOW (ref 3.87–5.11)
RDW: 13.4 % (ref 11.5–15.5)
WBC: 10.3 10*3/uL (ref 4.0–10.5)
nRBC: 0 % (ref 0.0–0.2)

## 2020-01-29 LAB — PROTEIN / CREATININE RATIO, URINE
Creatinine, Urine: 128.18 mg/dL
Protein Creatinine Ratio: 0.12 mg/mg{Cre} (ref 0.00–0.15)
Total Protein, Urine: 15 mg/dL

## 2020-01-29 NOTE — MAU Note (Signed)
Pt presents for BP evaluation.  Reports during virtual visit, BP elevated three times.  Denies H/A, visual disturbances, and epigastric pain.  Denies VB or LOF.  Endorses +FM.

## 2020-01-29 NOTE — Progress Notes (Signed)
Pt is on the phone preparing for virtual visit with provider. [redacted]w[redacted]d.  

## 2020-01-29 NOTE — MAU Provider Note (Signed)
History     CSN: [id number]  Arrival date and time: 01/29/20 1005   First Provider Initiated Contact with Patient 01/29/20 1317      Chief Complaint  Patient presents with  . BP evaluation   HPI Courtney [name] is a 29 y.o. [phone] at [redacted]w[redacted]d who presents to MAU for blood pressure evaluation. She was participating in a virtual OB appt with Dr. Rosana Hoes at [facility] and determined to have multiple elevated blood pressures on her home cuff.   She denies headache, visual disturbances, RUQ/epigastric pain, new onset swelling or weight gain. She denies history of elevated blood pressures.  She denies vaginal bleeding, leaking of fluid, decreased fetal movement, fever, falls, or recent illness.   OB History    Gravida  5   Para  3   Term  3   Preterm      AB  1   Living  3     SAB  1   TAB      Ectopic      Multiple  0   Live Births  3           Past Medical History:  Diagnosis Date  . Allergic rhinitis   . Back pain   . Common migraine with intractable migraine 12/16/2014   migraines PRN meds  . Depression    denies  . Eczema   . GERD (gastroesophageal reflux disease)    takes meds PRN   . Infection    UTI  . Scoliosis   . Short interval between pregnancies complicating pregnancy, antepartum 08/20/2018  . Supervision of normal pregnancy 01/17/2018   BABYSCRIPTS PATIENT: [x ]Initial [x ]12 [x ]14 [ x]28 [x] 32 [x ]36 [ ] 38 [ ] 39 [ ] 40  Nursing Staff Provider Office Location  [place] Dating  LMP c/w 6 week [place] Language  ENG Anatomy US  Wnl, incomplete Flu Vaccine  Declined 12/9 Genetic Screen  NIPS:wnl   AFP:  wnl    TDaP vaccine   info given 12/10/18 Hgb A1C or  GTT Early  Third trimester  Rhogam     LAB RESULTS  Feeding Plan BREAST  Blood Type -  . Supervision of other normal pregnancy, antepartum 01/17/2018   BABYSCRIPTS PATIENT: [x ]Initial [x ]12 [x ]78 [ x]28 [x] 32 [x ]36 [ ] 38 [ ] 39 [ ] 40  Nursing Staff Provider Office Location  [place] Dating  LMP c/w 6 week  [place] Language  ENG Anatomy US  Wnl, incomplete Flu Vaccine  Declined 12/9 Genetic Screen  NIPS:wnl   AFP:  wnl    TDaP vaccine   info given 12/10/18 Hgb A1C or  GTT Early  Third trimester  Rhogam     LAB RESULTS  Feeding Plan BREAST  Blood Type -  . Vitamin D deficiency     Past Surgical History:  Procedure Laterality Date  . CESAREAN SECTION N/A 01/17/2018   Procedure: CESAREAN SECTION;  Surgeon: Allyn Kenner, DO;  Location: [facility];  Service: Obstetrics;  Laterality: N/A;  . DILATION AND EVACUATION N/A 05/06/2015   Procedure: DILATATION AND EVACUATION;  Surgeon: Sherlyn Hay, DO;  Location: [facility];  Service: Gynecology;  Laterality: N/A;    Family History  Problem Relation Age of Onset  . Migraines Mother   . Asthma Mother   . Healthy Sister   . Healthy Brother   . Healthy Brother   . Healthy Brother   . Cancer Maternal Grandmother   . Cancer Paternal  Grandfather   . Cancer Maternal Aunt     Social History   Tobacco Use  . Smoking status: Never Smoker  . Smokeless tobacco: Never Used  Substance Use Topics  . Alcohol use: No    Comment: occasionally  . Drug use: No    Allergies:  Allergies  Allergen Reactions  . No Known Allergies     Medications Prior to Admission  Medication Sig Dispense Refill Last Dose  . Elastic Bandages & Supports (COMFORT FIT MATERNITY SUPP LG) MISC 1 Units by Does not apply route daily. 1 each 0 Past Month at Unknown time  . omeprazole (PRILOSEC) 20 MG capsule omeprazole 20 mg capsule,delayed release  TK ONE C PO ONCE A DAY     . Prenatal Vit-Fe Phos-FA-Omega (VITAFOL GUMMIES) 3.33-0.333-34.8 MG CHEW Chew 3 each by mouth daily. 90 tablet 11 More than a month at Unknown time    Review of Systems  Eyes: Negative for photophobia and visual disturbance.  Gastrointestinal: Negative for abdominal pain.  Genitourinary: Negative for dysuria, vaginal bleeding, vaginal discharge and vaginal pain.  Musculoskeletal: Negative for  back pain.  Neurological: Negative for headaches.  All other systems reviewed and are negative.  Physical Exam   Blood pressure 125/78, pulse (!) 106, temperature 98.6 F (37 C), temperature source Oral, resp. rate 20, height 5\' 2"  (1.575 m), weight 95.4 kg, last menstrual period 05/13/2019, SpO2 98 %, currently breastfeeding.  Physical Exam  Nursing note and vitals reviewed. Constitutional: She is oriented to person, place, and time. She appears well-developed and well-nourished.  Cardiovascular: Normal rate, normal heart sounds and intact distal pulses.  Respiratory: Effort normal and breath sounds normal.  GI: Soft. She exhibits no distension. There is no abdominal tenderness. There is no rebound and no guarding.  Gravid  Neurological: She is alert and oriented to person, place, and time.  Skin: Skin is warm and dry.  Psychiatric: She has a normal mood and affect. Her behavior is normal. Judgment and thought content normal.    MAU Course/MDM  Procedures  --Elevated pressure on EMAR/Epic today were on patient's home cuff --Reactive tracing: baseline 145, moderate variability, positive accels, no decels --Toco: rare contraction, UI --Normotensive throughout evaluation in MAU. Normal PEC labs  Patient Vitals for the past 24 hrs:  BP Temp Temp src Pulse Resp SpO2 Height Weight  01/29/20 1315 125/78 -- -- (!) 106 -- -- -- --  01/29/20 1300 131/72 -- -- 99 -- -- -- --  01/29/20 1245 120/80 -- -- (!) 109 -- -- -- --  01/29/20 1230 123/80 -- -- (!) 108 -- -- -- --  01/29/20 1215 130/73 -- -- (!) 112 -- -- -- --  01/29/20 1200 126/79 -- -- (!) 109 -- -- -- --  01/29/20 1145 123/78 -- -- (!) 108 -- -- -- --  01/29/20 1115 122/69 -- -- (!) 114 -- -- -- --  01/29/20 1100 137/81 -- -- (!) 104 -- -- -- --  01/29/20 1045 125/76 -- -- (!) 118 -- -- -- --  01/29/20 1029 137/84 98.6 F (37 C) Oral (!) 108 20 98 % 5\' 2"  (1.575 m) 95.4 kg   Results for orders placed or performed during the  hospital encounter of 01/29/20 (from the past 24 hour(s))  CBC     Status: Abnormal   Collection Time: 01/29/20 10:40 AM  Result Value Ref Range   WBC 10.3 4.0 - 10.5 K/uL   RBC 3.84 (L) 3.87 - 5.11 MIL/uL  Hemoglobin 11.7 (L) 12.0 - 15.0 g/dL   HCT 28.7 (L) 68.1 - 15.7 %   MCV 91.7 80.0 - 100.0 fL   MCH 30.5 26.0 - 34.0 pg   MCHC 33.2 30.0 - 36.0 g/dL   RDW 26.2 03.5 - 59.7 %   Platelets 175 150 - 400 K/uL   nRBC 0.0 0.0 - 0.2 %  Comprehensive metabolic panel     Status: Abnormal   Collection Time: 01/29/20 10:40 AM  Result Value Ref Range   Sodium 138 135 - 145 mmol/L   Potassium 4.1 3.5 - 5.1 mmol/L   Chloride 105 98 - 111 mmol/L   CO2 22 22 - 32 mmol/L   Glucose, Bld 107 (H) 70 - 99 mg/dL   BUN 11 6 - 20 mg/dL   Creatinine, Ser 4.16 0.44 - 1.00 mg/dL   Calcium 9.3 8.9 - 38.4 mg/dL   Total Protein 6.9 6.5 - 8.1 g/dL   Albumin 3.0 (L) 3.5 - 5.0 g/dL   AST 16 15 - 41 U/L   ALT 13 0 - 44 U/L   Alkaline Phosphatase 96 38 - 126 U/L   Total Bilirubin 0.5 0.3 - 1.2 mg/dL   GFR calc non Af Amer >60 >60 mL/min   GFR calc Af Amer >60 >60 mL/min   Anion gap 11 5 - 15  Protein / creatinine ratio, urine     Status: None   Collection Time: 01/29/20 11:46 AM  Result Value Ref Range   Creatinine, Urine 128.18 mg/dL   Total Protein, Urine 15 mg/dL   Protein Creatinine Ratio 0.12 0.00 - 0.15 mg/mg[Cre]  Urinalysis, Routine w reflex microscopic     Status: Abnormal   Collection Time: 01/29/20 11:46 AM  Result Value Ref Range   Color, Urine YELLOW YELLOW   APPearance HAZY (A) CLEAR   Specific Gravity, Urine 1.026 1.005 - 1.030   pH 6.0 5.0 - 8.0   Glucose, UA NEGATIVE NEGATIVE mg/dL   Hgb urine dipstick SMALL (A) NEGATIVE   Bilirubin Urine NEGATIVE NEGATIVE   Ketones, ur NEGATIVE NEGATIVE mg/dL   Protein, ur NEGATIVE NEGATIVE mg/dL   Nitrite NEGATIVE NEGATIVE   Leukocytes,Ua LARGE (A) NEGATIVE   RBC / HPF 6-10 0 - 5 RBC/hpf   WBC, UA 21-50 0 - 5 WBC/hpf   Bacteria, UA RARE (A)  NONE SEEN   Squamous Epithelial / LPF 0-5 0 - 5   Mucus PRESENT    Assessment and Plan  --29 y.o. [id number] at [redacted]w[redacted]d  --Reactive tracing --Normotensive --Normal PEC labs --Discharge home in stable condition  F/U: --[facility] in one week  Calvert Cantor, CNM 01/29/2020, 2:40 PM

## 2020-01-29 NOTE — Discharge Instructions (Signed)

## 2020-01-29 NOTE — Progress Notes (Signed)
   OBSTETRICS PRENATAL VIRTUAL VISIT ENCOUNTER NOTE  Provider location: [facility] at [facility]   I connected with Lynnae January on 01/29/20 at  9:15 AM EDT by MyChart Video Encounter at home and verified that I am speaking with the correct person using two identifiers.   I discussed the limitations, risks, security and privacy concerns of performing an evaluation and management service virtually and the availability of in person appointments. I also discussed with the patient that there may be a patient responsible charge related to this service. The patient expressed understanding and agreed to proceed. Subjective:  Courtney [name] is a 29 y.o. [phone] at [redacted]w[redacted]d being seen today for ongoing prenatal care.  She is currently monitored for the following issues for this high-risk pregnancy and has Common migraine with intractable migraine; History of cesarean section complicating pregnancy; Umbilical hernia without obstruction and without gangrene; History of VBAC; Obesity with body mass index 30 or greater; Supervision of other normal pregnancy, antepartum; Cervical high risk HPV (human papillomavirus) test positive; Fetal hydronephrosis during pregnancy, antepartum; and Unwanted fertility on their problem list.  Patient reports occasional contractions.  Contractions: Not present. Vag. Bleeding: None.  Movement: Present. Denies any leaking of fluid.   The following portions of the patient's history were reviewed and updated as appropriate: allergies, current medications, past family history, past medical history, past social history, past surgical history and problem list.   Objective:   Vitals:   01/29/20 0852 01/29/20 0854  BP: (!) 144/92 (!) 130/94   Fetal Status:     Movement: Present     General:  Alert, oriented and cooperative. Patient is in no acute distress.  Respiratory: Normal respiratory effort, no problems with respiration noted  Mental Status: Normal  mood and affect. Normal behavior. Normal judgment and thought content.  Rest of physical exam deferred due to type of encounter   Assessment and Plan:  Pregnancy: [id number] at [redacted]w[redacted]d  1. Supervision of other normal pregnancy, antepartum BP elevated this am Two mildly elevated BP this am, to MAU for rule out gHTN/PEC Reviewed with patient that if she remains elevated, will be kept for induction  2. History of VBAC  3. History of cesarean section complicating pregnancy For TOLAC  4. Obesity with body mass index 30 or greater  5. Unwanted fertility For BTL  Term labor symptoms and general obstetric precautions including but not limited to vaginal bleeding, contractions, leaking of fluid and fetal movement were reviewed in detail with the patient. I discussed the assessment and treatment plan with the patient. The patient was provided an opportunity to ask questions and all were answered. The patient agreed with the plan and demonstrated an understanding of the instructions. The patient was advised to call back or seek an in-person office evaluation/go to MAU at [facility] for any urgent or concerning symptoms. Please refer to After Visit Summary for other counseling recommendations.   I provided 12 minutes of face-to-face time during this encounter.  Return in about 1 week (around 02/05/2020) for high OB, in person.  No future appointments.  Conan Bowens, MD Center for [facility], [facility]

## 2020-01-30 LAB — CULTURE, OB URINE

## 2020-02-04 ENCOUNTER — Telehealth: Payer: Self-pay | Admitting: *Deleted

## 2020-02-04 NOTE — Telephone Encounter (Signed)
Pt called to office with concerns with her BP.  Pt states she was seen last week in hospital and her BP was stable. Pt states she has had a HA since the weekend- some better today.  Pt denies LOF, bleeding, visual changes. Pt does have HA and slight LE swelling.  Pt list last few BP's of 153/89, 114/68, 136/83. Pt has no know BP issues and is on no BP medication.   Reviewed with Dr Mayford Knife today, he recommends: Pt take Tylenol, hydrate with water and have something to eat and rest. If HA resolves, ok to keep appt in the morning. If HA still present, be evaluated at hospital.  Pt made aware of plan and agrees. Pt advised to keep appt tomorrow either way.

## 2020-02-05 ENCOUNTER — Other Ambulatory Visit: Payer: Self-pay

## 2020-02-05 ENCOUNTER — Encounter: Payer: Self-pay | Admitting: Obstetrics & Gynecology

## 2020-02-05 ENCOUNTER — Ambulatory Visit (INDEPENDENT_AMBULATORY_CARE_PROVIDER_SITE_OTHER): Payer: Medicaid Other | Admitting: Obstetrics & Gynecology

## 2020-02-05 VITALS — BP 118/72 | HR 92 | Wt 212.0 lb

## 2020-02-05 DIAGNOSIS — Z3480 Encounter for supervision of other normal pregnancy, unspecified trimester: Secondary | ICD-10-CM

## 2020-02-05 DIAGNOSIS — Z3A38 38 weeks gestation of pregnancy: Secondary | ICD-10-CM

## 2020-02-05 DIAGNOSIS — O34219 Maternal care for unspecified type scar from previous cesarean delivery: Secondary | ICD-10-CM

## 2020-02-05 DIAGNOSIS — Z98891 History of uterine scar from previous surgery: Secondary | ICD-10-CM

## 2020-02-05 NOTE — Patient Instructions (Signed)

## 2020-02-05 NOTE — Progress Notes (Signed)
Patient presents for ROB. Patient complains of having back spasms in her upper back. She desires to have her cervix checked today.

## 2020-02-05 NOTE — Progress Notes (Signed)
   PRENATAL VISIT NOTE  Subjective:  Courtney [name] is a 29 y.o. [phone] at [redacted]w[redacted]d being seen today for ongoing prenatal care.  She is currently monitored for the following issues for this high-risk pregnancy and has Common migraine with intractable migraine; History of cesarean section complicating pregnancy; Umbilical hernia without obstruction and without gangrene; History of VBAC; Obesity with body mass index 30 or greater; Supervision of other normal pregnancy, antepartum; Cervical high risk HPV (human papillomavirus) test positive; Fetal hydronephrosis during pregnancy, antepartum; and Unwanted fertility on their problem list.  Patient reports backache.  Contractions: Irregular. Vag. Bleeding: None.  Movement: Present. Denies leaking of fluid.   The following portions of the patient's history were reviewed and updated as appropriate: allergies, current medications, past family history, past medical history, past social history, past surgical history and problem list.   Objective:   Vitals:   02/05/20 0852  BP: 118/72  Pulse: 92  Weight: 96.2 kg    Fetal Status: Fetal Heart Rate (bpm): 130   Movement: Present  Presentation: Vertex  General:  Alert, oriented and cooperative. Patient is in no acute distress.  Skin: Skin is warm and dry. No rash noted.   Cardiovascular: Normal heart rate noted  Respiratory: Normal respiratory effort, no problems with respiration noted  Abdomen: Soft, gravid, appropriate for gestational age.  Pain/Pressure: Absent     Pelvic: Cervical exam performed in the presence of a chaperone Dilation: 1 Effacement (%): 40 Station: Ballotable  Extremities: Normal range of motion.  Edema: None  Mental Status: Normal mood and affect. Normal behavior. Normal judgment and thought content.   Assessment and Plan:  Pregnancy: [id number] at [redacted]w[redacted]d 1. History of VBAC Plans TOLAC  2. History of cesarean section complicating pregnancy Second delivery  3. Supervision  of other normal pregnancy, antepartum Muscle spasm left upper back, heat, ice, massage recommended  Term labor symptoms and general obstetric precautions including but not limited to vaginal bleeding, contractions, leaking of fluid and fetal movement were reviewed in detail with the patient. Please refer to After Visit Summary for other counseling recommendations.   Return in about 1 week (around 02/12/2020).  Future Appointments  Date Time Provider Department Center  02/13/2020  1:30 PM Hermina Staggers, MD [facility] None    Scheryl Darter, MD

## 2020-02-08 ENCOUNTER — Encounter (HOSPITAL_COMMUNITY): Payer: Self-pay | Admitting: Obstetrics and Gynecology

## 2020-02-08 ENCOUNTER — Inpatient Hospital Stay (EMERGENCY_DEPARTMENT_HOSPITAL)
Admission: AD | Admit: 2020-02-08 | Discharge: 2020-02-09 | Disposition: A | Discharge status: Home / Self Care | Payer: Medicaid Other | Source: Home / Self Care | Attending: Obstetrics and Gynecology | Admitting: Obstetrics and Gynecology

## 2020-02-08 ENCOUNTER — Other Ambulatory Visit: Payer: Self-pay

## 2020-02-08 DIAGNOSIS — O479 False labor, unspecified: Secondary | ICD-10-CM

## 2020-02-08 NOTE — MAU Note (Signed)
Pt here with reports of contractions that started earlier this evening, now about every 5-6 mins apart. She denies LOF or vaginal bleeding. Reports good fetal movement. Cervix was closed on last exam.

## 2020-02-09 ENCOUNTER — Inpatient Hospital Stay (HOSPITAL_COMMUNITY): Payer: Medicaid Other | Admitting: Anesthesiology

## 2020-02-09 ENCOUNTER — Encounter (HOSPITAL_COMMUNITY): Payer: Self-pay | Admitting: Obstetrics and Gynecology

## 2020-02-09 ENCOUNTER — Encounter (HOSPITAL_COMMUNITY)
Admission: AD | Disposition: A | Discharge status: Home / Self Care | Payer: Self-pay | Source: Ambulatory Visit | Attending: Obstetrics and Gynecology

## 2020-02-09 ENCOUNTER — Inpatient Hospital Stay (HOSPITAL_COMMUNITY)
Admission: AD | Admit: 2020-02-09 | Discharge: 2020-02-10 | DRG: 798 | Disposition: A | Discharge status: Home / Self Care | Payer: Medicaid Other | Attending: Obstetrics and Gynecology | Admitting: Obstetrics and Gynecology

## 2020-02-09 DIAGNOSIS — O479 False labor, unspecified: Secondary | ICD-10-CM

## 2020-02-09 DIAGNOSIS — Z3A38 38 weeks gestation of pregnancy: Secondary | ICD-10-CM

## 2020-02-09 DIAGNOSIS — Z302 Encounter for sterilization: Secondary | ICD-10-CM | POA: Diagnosis not present

## 2020-02-09 DIAGNOSIS — O34211 Maternal care for low transverse scar from previous cesarean delivery: Secondary | ICD-10-CM | POA: Diagnosis not present

## 2020-02-09 DIAGNOSIS — Z20822 Contact with and (suspected) exposure to covid-19: Secondary | ICD-10-CM | POA: Diagnosis present

## 2020-02-09 DIAGNOSIS — O26893 Other specified pregnancy related conditions, third trimester: Secondary | ICD-10-CM | POA: Diagnosis present

## 2020-02-09 DIAGNOSIS — O358XX0 Maternal care for other (suspected) fetal abnormality and damage, not applicable or unspecified: Secondary | ICD-10-CM | POA: Diagnosis present

## 2020-02-09 DIAGNOSIS — Z9851 Tubal ligation status: Secondary | ICD-10-CM

## 2020-02-09 DIAGNOSIS — O99214 Obesity complicating childbirth: Secondary | ICD-10-CM | POA: Diagnosis present

## 2020-02-09 DIAGNOSIS — O34219 Maternal care for unspecified type scar from previous cesarean delivery: Principal | ICD-10-CM

## 2020-02-09 DIAGNOSIS — Z3009 Encounter for other general counseling and advice on contraception: Secondary | ICD-10-CM

## 2020-02-09 HISTORY — PX: TUBAL LIGATION: SHX77

## 2020-02-09 LAB — TYPE AND SCREEN
ABO/RH(D): A POS
Antibody Screen: NEGATIVE

## 2020-02-09 LAB — RPR: RPR Ser Ql: NONREACTIVE

## 2020-02-09 LAB — CBC
HCT: 34.7 % — ABNORMAL LOW (ref 36.0–46.0)
Hemoglobin: 11.4 g/dL — ABNORMAL LOW (ref 12.0–15.0)
MCH: 30.2 pg (ref 26.0–34.0)
MCHC: 32.9 g/dL (ref 30.0–36.0)
MCV: 91.8 fL (ref 80.0–100.0)
Platelets: 167 10*3/uL (ref 150–400)
RBC: 3.78 MIL/uL — ABNORMAL LOW (ref 3.87–5.11)
RDW: 13.7 % (ref 11.5–15.5)
WBC: 11.7 10*3/uL — ABNORMAL HIGH (ref 4.0–10.5)
nRBC: 0 % (ref 0.0–0.2)

## 2020-02-09 LAB — SARS CORONAVIRUS 2 BY RT PCR (HOSPITAL ORDER, PERFORMED IN ~~LOC~~ HOSPITAL LAB): SARS Coronavirus 2: NEGATIVE

## 2020-02-09 SURGERY — LIGATION, FALLOPIAN TUBE, POSTPARTUM
Anesthesia: Epidural | Site: Abdomen | Laterality: Bilateral

## 2020-02-09 MED ORDER — OXYTOCIN 40 UNITS IN NORMAL SALINE INFUSION - SIMPLE MED
2.5000 [IU]/h | INTRAVENOUS | Status: DC
  Filled 2020-02-09: qty 1000

## 2020-02-09 MED ORDER — ONDANSETRON HCL 4 MG/2ML IJ SOLN
INTRAMUSCULAR | Status: DC | PRN
  Administered 2020-02-09: 4 mg via INTRAVENOUS

## 2020-02-09 MED ORDER — DEXAMETHASONE SODIUM PHOSPHATE 10 MG/ML IJ SOLN
INTRAMUSCULAR | Status: DC | PRN
  Administered 2020-02-09: 4 mg via INTRAVENOUS

## 2020-02-09 MED ORDER — DIPHENHYDRAMINE HCL 50 MG/ML IJ SOLN: 12.5000 mg | INTRAMUSCULAR | Status: DC | PRN

## 2020-02-09 MED ORDER — LIDOCAINE HCL (PF) 1 % IJ SOLN
INTRAMUSCULAR | Status: DC | PRN
  Administered 2020-02-09: 6 mL via EPIDURAL

## 2020-02-09 MED ORDER — SODIUM CHLORIDE (PF) 0.9 % IJ SOLN
INTRAMUSCULAR | Status: DC | PRN
  Administered 2020-02-09: 12 mL/h via EPIDURAL

## 2020-02-09 MED ORDER — TETANUS-DIPHTH-ACELL PERTUSSIS 5-2.5-18.5 LF-MCG/0.5 IM SUSP: 0.5000 mL | Freq: Once | INTRAMUSCULAR | Status: DC

## 2020-02-09 MED ORDER — FENTANYL CITRATE (PF) 100 MCG/2ML IJ SOLN
INTRAMUSCULAR | Status: DC | PRN
  Administered 2020-02-09: 100 ug via INTRAVENOUS

## 2020-02-09 MED ORDER — ONDANSETRON HCL 4 MG/2ML IJ SOLN: 4.0000 mg | Freq: Four times a day (QID) | INTRAMUSCULAR | Status: DC | PRN

## 2020-02-09 MED ORDER — FENTANYL CITRATE (PF) 100 MCG/2ML IJ SOLN
INTRAMUSCULAR | Status: AC
  Filled 2020-02-09: qty 2

## 2020-02-09 MED ORDER — DEXAMETHASONE SODIUM PHOSPHATE 4 MG/ML IJ SOLN
INTRAMUSCULAR | Status: AC
  Filled 2020-02-09: qty 1

## 2020-02-09 MED ORDER — PROPOFOL 10 MG/ML IV BOLUS
INTRAVENOUS | Status: DC | PRN
  Administered 2020-02-09: 20 mg via INTRAVENOUS
  Administered 2020-02-09: 30 mg via INTRAVENOUS
  Administered 2020-02-09 (×2): 20 mg via INTRAVENOUS

## 2020-02-09 MED ORDER — HYDROMORPHONE HCL 1 MG/ML IJ SOLN
INTRAMUSCULAR | Status: AC
  Filled 2020-02-09: qty 0.5

## 2020-02-09 MED ORDER — LACTATED RINGERS IV SOLN: INTRAVENOUS | Status: DC

## 2020-02-09 MED ORDER — ONDANSETRON HCL 4 MG PO TABS: 4.0000 mg | ORAL_TABLET | ORAL | Status: DC | PRN

## 2020-02-09 MED ORDER — ONDANSETRON HCL 4 MG/2ML IJ SOLN
INTRAMUSCULAR | Status: AC
  Filled 2020-02-09: qty 2

## 2020-02-09 MED ORDER — ACETAMINOPHEN 325 MG PO TABS
650.0000 mg | ORAL_TABLET | ORAL | Status: DC | PRN
  Administered 2020-02-09 – 2020-02-10 (×2): 650 mg via ORAL
  Filled 2020-02-09 (×2): qty 2

## 2020-02-09 MED ORDER — LACTATED RINGERS IV SOLN
500.0000 mL | Freq: Once | INTRAVENOUS | Status: AC
  Administered 2020-02-09: 500 mL via INTRAVENOUS

## 2020-02-09 MED ORDER — IBUPROFEN 600 MG PO TABS
600.0000 mg | ORAL_TABLET | Freq: Four times a day (QID) | ORAL | Status: DC
  Administered 2020-02-09 – 2020-02-10 (×4): 600 mg via ORAL
  Filled 2020-02-09 (×4): qty 1

## 2020-02-09 MED ORDER — ZOLPIDEM TARTRATE 5 MG PO TABS: 5.0000 mg | ORAL_TABLET | Freq: Every evening | ORAL | Status: DC | PRN

## 2020-02-09 MED ORDER — OXYCODONE HCL 5 MG PO TABS: 5.0000 mg | ORAL_TABLET | Freq: Once | ORAL | Status: DC | PRN

## 2020-02-09 MED ORDER — FENTANYL-BUPIVACAINE-NACL 0.5-0.125-0.9 MG/250ML-% EP SOLN
EPIDURAL | Status: AC
  Filled 2020-02-09: qty 250

## 2020-02-09 MED ORDER — DIPHENHYDRAMINE HCL 25 MG PO CAPS: 25.0000 mg | ORAL_CAPSULE | Freq: Four times a day (QID) | ORAL | Status: DC | PRN

## 2020-02-09 MED ORDER — MIDAZOLAM HCL 2 MG/2ML IJ SOLN
INTRAMUSCULAR | Status: DC | PRN
  Administered 2020-02-09: 2 mg via INTRAVENOUS

## 2020-02-09 MED ORDER — SOD CITRATE-CITRIC ACID 500-334 MG/5ML PO SOLN
30.0000 mL | ORAL | Status: DC | PRN
  Administered 2020-02-09: 30 mL via ORAL
  Filled 2020-02-09: qty 30

## 2020-02-09 MED ORDER — PHENYLEPHRINE 40 MCG/ML (10ML) SYRINGE FOR IV PUSH (FOR BLOOD PRESSURE SUPPORT): 80.0000 ug | PREFILLED_SYRINGE | INTRAVENOUS | Status: DC | PRN

## 2020-02-09 MED ORDER — FENTANYL CITRATE (PF) 100 MCG/2ML IJ SOLN
INTRAMUSCULAR | Status: DC | PRN
  Administered 2020-02-09: 100 ug via EPIDURAL

## 2020-02-09 MED ORDER — OXYCODONE HCL 5 MG PO TABS
5.0000 mg | ORAL_TABLET | ORAL | Status: DC | PRN
  Administered 2020-02-09 – 2020-02-10 (×4): 5 mg via ORAL
  Filled 2020-02-09 (×4): qty 1

## 2020-02-09 MED ORDER — OXYCODONE-ACETAMINOPHEN 5-325 MG PO TABS: 1.0000 | ORAL_TABLET | ORAL | Status: DC | PRN

## 2020-02-09 MED ORDER — LIDOCAINE 2% (20 MG/ML) 5 ML SYRINGE
INTRAMUSCULAR | Status: AC
  Filled 2020-02-09: qty 5

## 2020-02-09 MED ORDER — FENTANYL-BUPIVACAINE-NACL 0.5-0.125-0.9 MG/250ML-% EP SOLN: 12.0000 mL/h | EPIDURAL | Status: DC | PRN

## 2020-02-09 MED ORDER — ACETAMINOPHEN 325 MG PO TABS: 650.0000 mg | ORAL_TABLET | ORAL | Status: DC | PRN

## 2020-02-09 MED ORDER — EPHEDRINE 5 MG/ML INJ: 10.0000 mg | INTRAVENOUS | Status: DC | PRN

## 2020-02-09 MED ORDER — PRENATAL MULTIVITAMIN CH
1.0000 | ORAL_TABLET | Freq: Every day | ORAL | Status: DC
  Administered 2020-02-10: 1 via ORAL
  Filled 2020-02-09: qty 1

## 2020-02-09 MED ORDER — OXYTOCIN BOLUS FROM INFUSION
500.0000 mL | Freq: Once | INTRAVENOUS | Status: AC
  Administered 2020-02-09: 500 mL via INTRAVENOUS

## 2020-02-09 MED ORDER — SIMETHICONE 80 MG PO CHEW
80.0000 mg | CHEWABLE_TABLET | ORAL | Status: DC | PRN
  Administered 2020-02-10: 80 mg via ORAL
  Filled 2020-02-09: qty 1

## 2020-02-09 MED ORDER — MIDAZOLAM HCL 2 MG/2ML IJ SOLN
INTRAMUSCULAR | Status: AC
  Filled 2020-02-09: qty 2

## 2020-02-09 MED ORDER — SENNOSIDES-DOCUSATE SODIUM 8.6-50 MG PO TABS
2.0000 | ORAL_TABLET | ORAL | Status: DC
  Administered 2020-02-09: 2 via ORAL
  Filled 2020-02-09: qty 2

## 2020-02-09 MED ORDER — PROPOFOL 10 MG/ML IV BOLUS
INTRAVENOUS | Status: AC
  Filled 2020-02-09: qty 20

## 2020-02-09 MED ORDER — DIBUCAINE (PERIANAL) 1 % EX OINT: 1.0000 "application " | TOPICAL_OINTMENT | CUTANEOUS | Status: DC | PRN

## 2020-02-09 MED ORDER — OXYCODONE-ACETAMINOPHEN 5-325 MG PO TABS: 2.0000 | ORAL_TABLET | ORAL | Status: DC | PRN

## 2020-02-09 MED ORDER — PHENYLEPHRINE 40 MCG/ML (10ML) SYRINGE FOR IV PUSH (FOR BLOOD PRESSURE SUPPORT)
PREFILLED_SYRINGE | INTRAVENOUS | Status: AC
  Filled 2020-02-09: qty 10

## 2020-02-09 MED ORDER — BENZOCAINE-MENTHOL 20-0.5 % EX AERO
1.0000 "application " | INHALATION_SPRAY | CUTANEOUS | Status: DC | PRN
  Administered 2020-02-09: 1 via TOPICAL
  Filled 2020-02-09: qty 56

## 2020-02-09 MED ORDER — HYDROMORPHONE HCL 1 MG/ML IJ SOLN
0.2500 mg | INTRAMUSCULAR | Status: DC | PRN
  Administered 2020-02-09: 0.25 mg via INTRAVENOUS

## 2020-02-09 MED ORDER — BUPIVACAINE HCL 0.5 % IJ SOLN
INTRAMUSCULAR | Status: DC | PRN
  Administered 2020-02-09: 30 mL

## 2020-02-09 MED ORDER — PROMETHAZINE HCL 25 MG/ML IJ SOLN: 6.2500 mg | INTRAMUSCULAR | Status: DC | PRN

## 2020-02-09 MED ORDER — KETOROLAC TROMETHAMINE 30 MG/ML IJ SOLN
INTRAMUSCULAR | Status: AC
  Filled 2020-02-09: qty 1

## 2020-02-09 MED ORDER — FLEET ENEMA 7-19 GM/118ML RE ENEM: 1.0000 | ENEMA | RECTAL | Status: DC | PRN

## 2020-02-09 MED ORDER — WITCH HAZEL-GLYCERIN EX PADS: 1.0000 "application " | MEDICATED_PAD | CUTANEOUS | Status: DC | PRN

## 2020-02-09 MED ORDER — BUPIVACAINE HCL (PF) 0.5 % IJ SOLN
INTRAMUSCULAR | Status: AC
  Filled 2020-02-09: qty 30

## 2020-02-09 MED ORDER — KETOROLAC TROMETHAMINE 30 MG/ML IJ SOLN
30.0000 mg | Freq: Once | INTRAMUSCULAR | Status: AC | PRN
  Administered 2020-02-09: 30 mg via INTRAVENOUS

## 2020-02-09 MED ORDER — OXYCODONE HCL 5 MG/5ML PO SOLN: 5.0000 mg | Freq: Once | ORAL | Status: DC | PRN

## 2020-02-09 MED ORDER — LACTATED RINGERS IV SOLN
500.0000 mL | INTRAVENOUS | Status: DC | PRN
  Administered 2020-02-09: 500 mL via INTRAVENOUS

## 2020-02-09 MED ORDER — FENTANYL CITRATE (PF) 100 MCG/2ML IJ SOLN
100.0000 ug | INTRAMUSCULAR | Status: DC | PRN
  Administered 2020-02-09: 100 ug via INTRAVENOUS
  Filled 2020-02-09: qty 2

## 2020-02-09 MED ORDER — SODIUM BICARBONATE 8.4 % IV SOLN
INTRAVENOUS | Status: DC | PRN
  Administered 2020-02-09: 10 mL via EPIDURAL

## 2020-02-09 MED ORDER — LIDOCAINE HCL (PF) 1 % IJ SOLN: 30.0000 mL | INTRAMUSCULAR | Status: DC | PRN

## 2020-02-09 MED ORDER — COCONUT OIL OIL: 1.0000 "application " | TOPICAL_OIL | Status: DC | PRN

## 2020-02-09 MED ORDER — ONDANSETRON HCL 4 MG/2ML IJ SOLN: 4.0000 mg | INTRAMUSCULAR | Status: DC | PRN

## 2020-02-09 SURGICAL SUPPLY — 37 items
ADH SKN CLS APL DERMABOND .7 (GAUZE/BANDAGES/DRESSINGS) ×1
ADH SKN CLS LQ APL DERMABOND (GAUZE/BANDAGES/DRESSINGS) ×1
BLADE SURG 15 STRL LF C SS BP (BLADE) IMPLANT
BLADE SURG 15 STRL SS (BLADE) ×3
CLIP FILSHIE TUBAL LIGA STRL (Clip) ×4 IMPLANT
CLOSURE WOUND 1/2 X4 (GAUZE/BANDAGES/DRESSINGS) ×1
CLOTH BEACON ORANGE TIMEOUT ST (SAFETY) ×3 IMPLANT
DERMABOND ADHESIVE PROPEN (GAUZE/BANDAGES/DRESSINGS) ×2
DERMABOND ADVANCED (GAUZE/BANDAGES/DRESSINGS) ×2
DERMABOND ADVANCED .7 DNX12 (GAUZE/BANDAGES/DRESSINGS) ×1 IMPLANT
DERMABOND ADVANCED .7 DNX6 (GAUZE/BANDAGES/DRESSINGS) IMPLANT
DRESSING OPSITE X SMALL 2X3 (GAUZE/BANDAGES/DRESSINGS) ×2 IMPLANT
DRSG OPSITE POSTOP 3X4 (GAUZE/BANDAGES/DRESSINGS) ×3 IMPLANT
DURAPREP 26ML APPLICATOR (WOUND CARE) ×3 IMPLANT
ELECT REM PT RETURN 9FT ADLT (ELECTROSURGICAL) ×3
ELECTRODE REM PT RTRN 9FT ADLT (ELECTROSURGICAL) ×1 IMPLANT
GLOVE BIO SURGEON STRL SZ7 (GLOVE) ×3 IMPLANT
GLOVE BIOGEL PI IND STRL 7.0 (GLOVE) ×1 IMPLANT
GLOVE BIOGEL PI IND STRL 7.5 (GLOVE) ×1 IMPLANT
GLOVE BIOGEL PI INDICATOR 7.0 (GLOVE) ×2
GLOVE BIOGEL PI INDICATOR 7.5 (GLOVE) ×2
GOWN STRL REUS W/TWL LRG LVL3 (GOWN DISPOSABLE) ×6 IMPLANT
NEEDLE HYPO 22GX1.5 SAFETY (NEEDLE) ×5 IMPLANT
NS IRRIG 1000ML POUR BTL (IV SOLUTION) ×3 IMPLANT
PACK ABDOMINAL MINOR (CUSTOM PROCEDURE TRAY) ×3 IMPLANT
PENCIL BUTTON HOLSTER BLD 10FT (ELECTRODE) IMPLANT
PROTECTOR NERVE ULNAR (MISCELLANEOUS) ×3 IMPLANT
SPONGE LAP 4X18 RFD (DISPOSABLE) ×3 IMPLANT
STRIP CLOSURE SKIN 1/2X4 (GAUZE/BANDAGES/DRESSINGS) ×1 IMPLANT
SUT MNCRL AB 4-0 PS2 18 (SUTURE) ×3 IMPLANT
SUT PLAIN 2 0 (SUTURE)
SUT PLAIN ABS 2-0 CT1 27XMFL (SUTURE) IMPLANT
SUT VICRYL 0 TIES 12 18 (SUTURE) IMPLANT
SUT VICRYL 0 UR6 27IN ABS (SUTURE) ×3 IMPLANT
SYR CONTROL 10ML LL (SYRINGE) ×5 IMPLANT
TOWEL OR 17X24 6PK STRL BLUE (TOWEL DISPOSABLE) ×6 IMPLANT
TRAY FOLEY CATH SILVER 14FR (SET/KITS/TRAYS/PACK) ×3 IMPLANT

## 2020-02-09 NOTE — MAU Note (Signed)
I have communicated with Wynelle Bourgeois, CNM and reviewed vital signs:  Vitals:   02/08/20 2305 02/09/20 0034  BP: 129/81 134/72  Pulse: 88   Resp: 20   Temp: 98.4 F (36.9 C)   SpO2: 99%     Vaginal exam:  Dilation: Closed Effacement (%): 50 Cervical Position: Posterior Exam by:: Manuela Neptune, RN,   Also reviewed contraction pattern and that non-stress test is reactive.  It has been documented that patient is contracting irregularly with no cervical change over 1 hour not indicating active labor.  Patient denies any other complaints.  Based on this report provider has given order for discharge.  A discharge order and diagnosis entered by a provider.   Labor discharge instructions reviewed with patient.

## 2020-02-09 NOTE — Anesthesia Procedure Notes (Signed)
Epidural Patient location during procedure: OB Start time: 02/09/2020 4:54 AM End time: 02/09/2020 5:00 AM  Staffing Anesthesiologist: Bethena Midget, MD  Preanesthetic Checklist Completed: patient identified, IV checked, site marked, risks and benefits discussed, surgical consent, monitors and equipment checked, pre-op evaluation and timeout performed  Epidural Patient position: sitting Prep: DuraPrep and site prepped and draped Patient monitoring: continuous pulse ox and blood pressure Approach: midline Location: L4-L5 Injection technique: LOR air  Needle:  Needle type: Tuohy  Needle gauge: 17 G Needle length: 9 cm and 9 Needle insertion depth: 8 cm Catheter type: closed end flexible Catheter size: 19 Gauge Catheter at skin depth: 12 cm Test dose: negative  Assessment Events: blood not aspirated, injection not painful, no injection resistance, no paresthesia and negative IV test

## 2020-02-09 NOTE — Anesthesia Preprocedure Evaluation (Signed)
Anesthesia Evaluation  Patient identified by MRN, date of birth, ID band Patient awake    Reviewed: Allergy & Precautions, H&P , NPO status , Patient's Chart, lab work & pertinent test results, reviewed documented beta blocker date and time   Airway Mallampati: III  TM Distance: >3 FB Neck ROM: full    Dental no notable dental hx. (+) Teeth Intact, Dental Advisory Given   Pulmonary neg pulmonary ROS,    Pulmonary exam normal breath sounds clear to auscultation       Cardiovascular negative cardio ROS Normal cardiovascular exam Rhythm:regular Rate:Normal     Neuro/Psych  Headaches, PSYCHIATRIC DISORDERS Depression    GI/Hepatic Neg liver ROS, GERD  ,  Endo/Other  Morbid obesity  Renal/GU Renal disease  negative genitourinary   Musculoskeletal   Abdominal (+) + obese,   Peds  Hematology negative hematology ROS (+)   Anesthesia Other Findings   Reproductive/Obstetrics (+) Pregnancy                             Anesthesia Physical  Anesthesia Plan  ASA: III  Anesthesia Plan: Epidural   Post-op Pain Management:    Induction:   PONV Risk Score and Plan: 2 and Ondansetron, Midazolam and Treatment may vary due to age or medical condition  Airway Management Planned:   Additional Equipment:   Intra-op Plan:   Post-operative Plan:   Informed Consent: I have reviewed the patients History and Physical, chart, labs and discussed the procedure including the risks, benefits and alternatives for the proposed anesthesia with the patient or authorized representative who has indicated his/her understanding and acceptance.     Dental Advisory Given  Plan Discussed with: Anesthesiologist  Anesthesia Plan Comments: (Labs checked- platelets confirmed with RN in room. Fetal heart tracing, per RN, reported to be stable enough for sitting procedure. Discussed epidural, and patient consents to the  procedure:  included risk of possible headache,backache, failed block, allergic reaction, and nerve injury. This patient was asked if she had any questions or concerns before the procedure started.)        Anesthesia Quick Evaluation

## 2020-02-09 NOTE — H&P (Addendum)
OBSTETRIC ADMISSION HISTORY AND PHYSICAL  Courtney [name] is a 29 y.o. female [phone] with IUP at [redacted]w[redacted]d by LMP presenting for spontaneous onset of labor. She reports +FMs, No LOF, no VB, no blurry vision, headaches or peripheral edema, and RUQ pain.  She plans on breast and bottle feeding. She request BTL for birth control. She received her prenatal care at  [facility]    Dating: By LMP --->  Estimated Date of Delivery: 02/17/20  Sono:    @[redacted]w[redacted]d , CWD, Left renal hydronephrosis with otherwise normal anatomy, cephalic presentation, posterior placenta, 2372g, 49% EFW   Nursing Staff Provider  Office Location  [facility] Dating  LMP  Language  Eglish Anatomy  scheduled 09/23/2019  Flu Vaccine  Declined 08/14/19 Genetic Screen  NIPS: low-risk female   AFP: negative   First Screen:  Quad:   TDaP vaccine   Declined 11/13/19 Hgb A1C or  GTT Early 5.2 Third trimester  Ref. Range 11/13/2019 10:56  Glucose, 1 hour Latest Ref Range: 65 - 179 mg/dL 01/13/2020  Glucose, Fasting Latest Ref Range: 65 - 91 mg/dL 83  Glucose, 2 hour Latest Ref Range: 65 - 152 mg/dL 696    Rhogam  n/a   LAB RESULTS   Feeding Plan Breast & Bottle Blood Type A/Positive/-- (12/02 1452)   Contraception BTL Antibody Negative (12/02 1452)  Circumcision YES if BOY Rubella 1.68 (12/02 1452)  Pediatrician  [facility] RPR Non Reactive (03/03 1056)   Support Person FOB HBsAg Negative (12/02 1452)   Prenatal Classes  HIV Non Reactive (03/03 1056)  BTL Consent  GBS Negative    VBAC Consent  Pap  08/2019 - hrHPV+, NILM, needs repeat Pap 1 year    Hgb Electro   normal [place]  BP Cuff Yes  CF  normal Horizon    SMA  normal Horizon    Waterbirth  [ ]  Class [ ]  Consent [ ]  CNM visit    Prenatal History/Complications:  -History of VBAC -History of C/S with second delivery  -Short Interval Pregnancy (Last delivery in June 2020) -Fetal Left Hydronephrosis  -Obesity, BMI 39  Past Medical History: Past Medical History:  Diagnosis Date    Allergic rhinitis    Back pain    Common migraine with intractable migraine 12/16/2014   migraines PRN meds   Depression    denies   Eczema    GERD (gastroesophageal reflux disease)    takes meds PRN    Infection    UTI   Scoliosis    Short interval between pregnancies complicating pregnancy, antepartum 08/20/2018   Supervision of normal pregnancy 01/17/2018   BABYSCRIPTS PATIENT: [x ]Initial [x ]12 [x ]20 [ x]28 [x] 32 [x ]36 [ ] 38 [ ] 39 [ ] 40  Nursing Staff Provider Office Location  [facility] Dating  LMP c/w 6 week July 2020 Language  ENG Anatomy 02/15/2015  Wnl, incomplete Flu Vaccine  Declined 12/9 Genetic Screen  NIPS:wnl   AFP:  wnl    TDaP vaccine   info given 12/10/18 Hgb A1C or  GTT Early  Third trimester  Rhogam     LAB RESULTS  Feeding Plan BREAST  Blood Type -   Supervision of other normal pregnancy, antepartum 01/17/2018   BABYSCRIPTS PATIENT: [x ]Initial [x ]12 [x ]20 [ x]28 [x] 32 [x ]36 [ ] 38 [ ] 39 [ ] 40  Nursing Staff Provider Office Location  [facility] Dating  LMP c/w 6 week Language  ENG Anatomy  Wnl, incomplete Flu Vaccine  Declined 12/9  Genetic Screen  NIPS:wnl   AFP:  wnl    TDaP vaccine   info given 12/10/18 Hgb A1C or  GTT Early  Third trimester  Rhogam     LAB RESULTS  Feeding Plan BREAST  Blood Type -   Vitamin D deficiency     Past Surgical History: Past Surgical History:  Procedure Laterality Date   CESAREAN SECTION N/A 01/17/2018   Procedure: CESAREAN SECTION;  Surgeon: Allyn Kenner, DO;  Location: [place];  Service: Obstetrics;  Laterality: N/A;   DILATION AND EVACUATION N/A 05/06/2015   Procedure: DILATATION AND EVACUATION;  Surgeon: Sherlyn Hay, DO;  Location: [facility];  Service: Gynecology;  Laterality: N/A;    Obstetrical History: OB History     Gravida  5   Para  3   Term  3   Preterm      AB  1   Living  3      SAB  1   TAB      Ectopic      Multiple  0   Live Births  3           Social History Social History    Socioeconomic History   Marital status: Single    Spouse name: Not on file   Number of children: 1   Years of education: 12   Highest education level: Not on file  Occupational History   Occupation: Special educational needs teacher Distribution  Tobacco Use   Smoking status: Never Smoker   Smokeless tobacco: Never Used  Substance and Sexual Activity   Alcohol use: Not Currently    Comment: occasionally   Drug use: No   Sexual activity: Yes    Birth control/protection: None  Other Topics Concern   Not on file  Social History Narrative   Patient is right handed.   Patient drinks 2 cups of caffeine daily.   Social Determinants of Health   Financial Resource Strain: Low Risk    Difficulty of Paying Living Expenses: Not hard at all  Food Insecurity: No Food Insecurity   Worried About Charity fundraiser in the Last Year: Never true   [place] in the Last Year: Never true  Transportation Needs: No Transportation Needs   Lack of Transportation (Medical): No   Lack of Transportation (Non-Medical): No  Physical Activity: Unknown   Days of Exercise per Week: Patient refused   Minutes of Exercise per Session: Patient refused  Stress: No Stress Concern Present   Feeling of Stress : Only a little  Social Connections: Unknown   Frequency of Communication with Friends and Family: Patient refused   Frequency of Social Gatherings with Friends and Family: Patient refused   Attends Religious Services: Patient refused   Marine scientist or Organizations: Patient refused   Attends Music therapist: Patient refused   Marital Status: Patient refused    Family History: Family History  Problem Relation Age of Onset   Migraines Mother    Asthma Mother    Healthy Sister    Healthy Brother    Healthy Brother    Healthy Brother    Cancer Maternal Grandmother    Cancer Paternal Grandfather    Cancer Maternal Aunt     Allergies: Allergies  Allergen Reactions   No Known  Allergies     Medications Prior to Admission  Medication Sig Dispense Refill Last Dose   Elastic Bandages & Supports (COMFORT FIT MATERNITY SUPP LG)  MISC 1 Units by Does not apply route daily. 1 each 0    omeprazole (PRILOSEC) 20 MG capsule omeprazole 20 mg capsule,delayed release  TK ONE C PO ONCE A DAY      Prenatal Vit-Fe Phos-FA-Omega (VITAFOL GUMMIES) 3.33-0.333-34.8 MG CHEW Chew 3 each by mouth daily. 90 tablet 11      Review of Systems   All systems reviewed and negative except as stated in HPI  Last menstrual period 05/13/2019, currently breastfeeding. General appearance: alert and cooperative Lungs: breathing comfortably on RA Heart: regular rate and rhythm Abdomen: gravid Pelvic: not examined Extremities: no sign of DVT Presentation: cephalic on cervical exam Fetal monitoring 145 bpm/moderate variability/+accels, few early decels Uterine activity started at ~1900, now q2-4 min Dilation: 5 Effacement (%): 70 Station: -1 Exam by:: Camelia Eng, RN   Prenatal labs: ABO, Rh: A/Positive/-- (12/02 1452) Antibody: Negative (12/02 1452) Rubella: 1.68 (12/02 1452) RPR: Non Reactive (03/03 1056)  HBsAg: Negative (12/02 1452)  HIV: Non Reactive (03/03 1056)  GBS: Negative/-- (05/12 1015)  2 hr Glucola normal, 83/111/111 Genetic screening  Neg AFP, low-risk NIPS Anatomy US left fetal hydronephrosis, otherwise normal  Prenatal Transfer Tool  Maternal Diabetes: No Genetic Screening: Normal Maternal Ultrasounds/Referrals: Fetal Kidney Anomalies: Left hydronephrosis Fetal Ultrasounds or other Referrals:  None Maternal Substance Abuse:  No Significant Maternal Medications:  None Significant Maternal Lab Results: Group B Strep negative  No results found for this or any previous visit (from the past 24 hour(s)).  Patient Active Problem List   Diagnosis Date Noted   Unwanted fertility 01/29/2020   Fetal hydronephrosis during pregnancy, antepartum 10/09/2019    Cervical high risk HPV (human papillomavirus) test positive 08/17/2019   Supervision of other normal pregnancy, antepartum 08/14/2019   History of VBAC 03/04/2019   Umbilical hernia without obstruction and without gangrene 10/15/2018   History of cesarean section complicating pregnancy 08/20/2018   Obesity with body mass index 30 or greater 06/12/2017   Common migraine with intractable migraine 12/16/2014    Assessment/Plan:  Courtney [name] is a 29 y.o. [id number] at [redacted]w[redacted]d here for spontaneous onset of labor.  #Labor: Admit to [facility].  Routine Labor and Delivery Orders per protocol.  Has no questions at this time.  Will hold off on augmentation at this time. #Pain: Fentanyl prn, epidural if desired #FWB: Category I #ID: GBS negative #MOF: Both #MOC:Desires Post-Partum BTL, consent signed on 11/13/19 and in chart #Circ: yes #Fetal Left Hydronephorsis: Pediatrician to manage after delivery.  Solmon Ice Meccariello, DO  02/09/2020, 3:30 AM  Attestation:  I confirm that I have verified the information documented in the resident's note and that I have also personally reperformed the physical exam and all medical decision making activities.  The patient was seen and examined by me also Agree with note NST reactive and reassuring UCs as listed Cervical exams as listed in note Aviva Signs, CNM

## 2020-02-09 NOTE — Anesthesia Postprocedure Evaluation (Signed)
Anesthesia Post Note  Patient: Courtney [name]  Procedure(s) Performed: POST PARTUM TUBAL LIGATION (Bilateral Abdomen)     Patient location during evaluation: PACU Anesthesia Type: Epidural Level of consciousness: awake and alert Pain management: pain level controlled Vital Signs Assessment: post-procedure vital signs reviewed and stable Respiratory status: spontaneous breathing, nonlabored ventilation and respiratory function stable Cardiovascular status: blood pressure returned to baseline and stable Postop Assessment: no apparent nausea or vomiting Anesthetic complications: no    Last Vitals:  Vitals:   02/09/20 1123 02/09/20 1127  BP:    Pulse: 88 72  Resp: (!) 26 (!) 21  Temp:    SpO2: 98%     Last Pain:  Vitals:   02/09/20 1115  TempSrc:   PainSc: 0-No pain   Pain Goal:    LLE Motor Response: Non-purposeful movement(rocks foot) (02/09/20 1100)   RLE Motor Response: Non-purposeful movement(rocks foot) (02/09/20 1100)       Epidural/Spinal Function Cutaneous sensation: Tingles (02/09/20 1100), Patient able to flex knees: No (02/09/20 1100), Patient able to lift hips off bed: No (02/09/20 1100), Back pain beyond tenderness at insertion site: No (02/09/20 1100), Progressively worsening motor and/or sensory loss: No (02/09/20 1100), Bowel and/or bladder incontinence post epidural: No (02/09/20 1100)  Lowella Curb

## 2020-02-09 NOTE — Anesthesia Postprocedure Evaluation (Signed)
Anesthesia Post Note  Patient: Courtney [name]  Procedure(s) Performed: AN AD HOC LABOR EPIDURAL     Patient location during evaluation: L&D Anesthesia Type: Epidural Level of consciousness: awake, awake and alert and oriented Pain management: pain level controlled Vital Signs Assessment: post-procedure vital signs reviewed and stable Respiratory status: spontaneous breathing Cardiovascular status: blood pressure returned to baseline and stable Postop Assessment: no headache and no backache Anesthetic complications: no Comments: Pt came to OR for postpartum tubal ligation and epidural had receded some. Pt agreed to use epidural of procedure.    Last Vitals:  Vitals:   02/09/20 1037 02/09/20 1038  BP:    Pulse: 67 66  Resp: 19 20  Temp:    SpO2: 98% 98%    Last Pain:  Vitals:   02/09/20 1038  TempSrc:   PainSc: 0-No pain   Pain Goal:              PT in OR for BPTL and agreed to use current epidural for pain management.  Junious Silk

## 2020-02-09 NOTE — Discharge Instructions (Signed)

## 2020-02-09 NOTE — MAU Provider Note (Signed)
Chief Complaint:  Contractions .S: [name] is a 29 y.o. [id number] at [redacted]w[redacted]d  who presents to MAU today for labor evaluation.     Cervical exam by RN:  Dilation: Closed Effacement (%): 50 Cervical Position: Posterior Exam by:: Courtney Neptune, RN No change after one hour  Fetal Monitoring: Baseline: 140 Variability: average Accelerations: present Decelerations: absent Contractions: irregular  MDM Discussed patient with RN. NST reviewed.   A: SIUP at [redacted]w[redacted]d  False labor  P: Discharge home Labor precautions and kick counts included in AVS Patient to follow-up with [facility] as scheduled  Patient may return to MAU as needed or when in labor   Courtney [name] 02/09/2020 12:27 AM  Past Medical History: Past Medical History:  Diagnosis Date  . Allergic rhinitis   . Back pain   . Common migraine with intractable migraine 12/16/2014   migraines PRN meds  . Depression    denies  . Eczema   . GERD (gastroesophageal reflux disease)    takes meds PRN   . Infection    UTI  . Scoliosis   . Short interval between pregnancies complicating pregnancy, antepartum 08/20/2018  . Supervision of normal pregnancy 01/17/2018   BABYSCRIPTS PATIENT: [x ]Initial [x ]12 [x ]20 [ x]28 [x] 32 [x ]36 [ ] 38 [ ] 39 [ ] 40  Nursing Staff Provider Office Location  [facility] Dating  LMP c/w 6 week Language  ENG Anatomy  Wnl, incomplete Flu Vaccine  Declined 12/9 Genetic Screen  NIPS:wnl   AFP:  wnl    TDaP vaccine   info given 12/10/18 Hgb A1C or  GTT Early  Third trimester  Rhogam     LAB RESULTS  Feeding Plan BREAST  Blood Type -  . Supervision of other normal pregnancy, antepartum 01/17/2018   BABYSCRIPTS PATIENT: [x ]Initial [x ]12 [x ]20 [ x]28 [x] 32 [x ]36 [ ] 38 [ ] 39 [ ] 40  Nursing Staff Provider Office Location  [facility] Dating  LMP c/w 6 week [place] Language  ENG Anatomy 14/9  Wnl, incomplete Flu Vaccine  Declined 12/9 Genetic Screen  NIPS:wnl   AFP:  wnl    TDaP vaccine   info given  12/10/18 Hgb A1C or  GTT Early  Third trimester  Rhogam     LAB RESULTS  Feeding Plan BREAST  Blood Type -  . Vitamin D deficiency     Past obstetric history: OB History  Gravida Para Term Preterm AB Living  5 3 3   1 3   SAB TAB Ectopic Multiple Live Births  1     0 3    # Outcome Date GA Lbr Len/2nd Weight Sex Delivery Anes PTL Lv  5 Current           4 Term 03/04/19 [redacted]w[redacted]d / 00:09 3289 g M VBAC None  LIV  3 Term 01/17/18 [redacted]w[redacted]d    CS-Unspec   LIV  2 SAB 2016          1 Term      Vag-Spont   LIV    Past Surgical History: Past Surgical History:  Procedure Laterality Date  . CESAREAN SECTION N/A 01/17/2018   Procedure: CESAREAN SECTION;  Surgeon: 14/9, DO;  Location: [facility];  Service: Obstetrics;  Laterality: N/A;  . DILATION AND EVACUATION N/A 05/06/2015   Procedure: DILATATION AND EVACUATION;  Surgeon: 03/06/19, DO;  Location: [facility];  Service: Gynecology;  Laterality: N/A;    Family History: Family History  Problem Relation Age of Onset  . Migraines Mother   . Asthma Mother   . Healthy Sister   . Healthy Brother   . Healthy Brother   . Healthy Brother   . Cancer Maternal Grandmother   . Cancer Paternal Grandfather   . Cancer Maternal Aunt     Social History: Social History   Tobacco Use  . Smoking status: Never Smoker  . Smokeless tobacco: Never Used  Substance Use Topics  . Alcohol use: Not Currently    Comment: occasionally  . Drug use: No    Allergies:  Allergies  Allergen Reactions  . No Known Allergies     Meds:  Medications Prior to Admission  Medication Sig Dispense Refill Last Dose  . Elastic Bandages & Supports (COMFORT FIT MATERNITY SUPP LG) MISC 1 Units by Does not apply route daily. 1 each 0   . omeprazole (PRILOSEC) 20 MG capsule omeprazole 20 mg capsule,delayed release  TK ONE C PO ONCE A DAY     . Prenatal Vit-Fe Phos-FA-Omega (VITAFOL GUMMIES) 3.33-0.333-34.8 MG CHEW Chew 3 each by mouth daily. 90  tablet 11 More than a month at Unknown time     Physical Exam   Patient Vitals for the past 24 hrs:  BP Temp Temp src Pulse Resp SpO2 Height Weight  02/08/20 2305 129/81 98.4 F (36.9 C) Oral 88 20 99 % -- --  02/08/20 2245 -- -- -- -- -- -- 5\' 2"  (1.575 m) 97.8 kg  Assessment: 1. False labor     Plan: Discharge home Labor precautions and fetal kick counts Follow up in Office for prenatal visits and recheck Follow-up Information    Courtney [name] Follow up.   Contact information: [place] [id number] [phone]          Pt stable at time of discharge.  Courtney [name] CNM, MSN Certified Nurse-Midwife 02/09/2020 12:27 AM

## 2020-02-09 NOTE — Progress Notes (Signed)
OB Note BTL discussed with patient. R/B/A d/w her and she would like a BTL. Papers are UTD. Pt told to stay NPO, consent signed. Hopefully can do this morning. Pt has epidural in place  Cornelia Copa MD Attending [facility] [employer] (Faculty Practice) 02/09/2020 Time: [phone]

## 2020-02-09 NOTE — Op Note (Signed)
Operative Note   02/09/2020  PRE-OP DIAGNOSIS: Desire for permanent sterilization.  Postpartum Day #0   POST-OP DIAGNOSIS: Same  SURGEON: Surgeon(s) and Role:    * [place] Bing, MD - Primary  ASSISTANT: None  ANESTHESIA: Epidural and local  PROCEDURE: mini-laparotomy, bilateral tubal ligation via Filshie Clips method  ESTIMATED BLOOD LOSS: 42mL  DRAINS: none  TOTAL IV FLUIDS: per anesthesia note  SPECIMENS:  None  VTE PROPHYLAXIS: SCDs to the bilateral lower extremities  ANTIBIOTICS: not indicated  COMPLICATIONS: none  DISPOSITION: PACU - hemodynamically stable.  CONDITION: stable  FINDINGS: No intra-abdominal adhesions noted. Smooth, normally contoured uterine fundus and bilateral tubes. Normal appearing tubes and normal ovaries on palpation.  PROCEDURE IN DETAIL: The patient was taken to the OR where anesthesia was administed. The patient was positioned in dorsal supine. The patient was prepped and draped in the normal sterile fashion a 4cm horizontal incision was made approximately 2 finger breadths below the umbilicus, after injection with local anesthesia. The skin was then incised with the scalpel and the underlying tissue dissected with the bovie and the fascia nicked in the midline with the scalpel and then extended laterally sharply.  The abdomen was then entered bluntly and a moist lap sponge used to displace the bowel.   The left Fallopian tube was identified by tracing out to the fimbraie, grasped with the Babcock clamps. An avascular midsection of the tube approximately 3-4cm from the cornua was grasped with the babcock clamps and the filshie clip was applied, taking care to incorporate the entire tube.  Attention was then turned to the right fallopian tube after confirmation by tracing the tube out to the fimbriae. The same procedure was then performed on the right Fallopian tube, with excellent hemostasis was noted from both BTL sites.  The lap sponge was  then removed from the abdomen and the fascia closed in running fashion with 0 vicryl and the subcutaneous tissue irrigated.The skin was then closed with 4-0 monocryl and Dermabond.   The patient tolerated the procedure well. All counts were correct x 2. The patient was transferred to the recovery room awake, alert and breathing independently.   Cornelia Copa MD Attending [facility] [employer] Midwife)

## 2020-02-09 NOTE — Anesthesia Preprocedure Evaluation (Signed)
Anesthesia Evaluation  Patient identified by MRN, date of birth, ID band Patient awake    Reviewed: Allergy & Precautions, H&P , NPO status , Patient's Chart, lab work & pertinent test results, reviewed documented beta blocker date and time   Airway Mallampati: III  TM Distance: >3 FB Neck ROM: full    Dental no notable dental hx. (+) Teeth Intact, Dental Advisory Given   Pulmonary neg pulmonary ROS,    Pulmonary exam normal breath sounds clear to auscultation       Cardiovascular negative cardio ROS Normal cardiovascular exam Rhythm:regular Rate:Normal     Neuro/Psych  Headaches, PSYCHIATRIC DISORDERS Depression    GI/Hepatic Neg liver ROS, GERD  ,  Endo/Other  Morbid obesity  Renal/GU Renal disease  negative genitourinary   Musculoskeletal   Abdominal (+) + obese,   Peds  Hematology negative hematology ROS (+)   Anesthesia Other Findings   Reproductive/Obstetrics (+) Pregnancy                             Anesthesia Physical Anesthesia Plan  ASA: III  Anesthesia Plan: Epidural   Post-op Pain Management:    Induction:   PONV Risk Score and Plan:   Airway Management Planned:   Additional Equipment:   Intra-op Plan:   Post-operative Plan:   Informed Consent: I have reviewed the patients History and Physical, chart, labs and discussed the procedure including the risks, benefits and alternatives for the proposed anesthesia with the patient or authorized representative who has indicated his/her understanding and acceptance.     Dental Advisory Given  Plan Discussed with: Anesthesiologist  Anesthesia Plan Comments: (Labs checked- platelets confirmed with RN in room. Fetal heart tracing, per RN, reported to be stable enough for sitting procedure. Discussed epidural, and patient consents to the procedure:  included risk of possible headache,backache, failed block, allergic  reaction, and nerve injury. This patient was asked if she had any questions or concerns before the procedure started.)        Anesthesia Quick Evaluation

## 2020-02-09 NOTE — MAU Note (Signed)
Pt returns to MAU complaining of CTX every 2.5-3 minutes. Pt reports that they are more intense and closer than earlier.  +FM. No vaginal bleeding or leaking of fluid.

## 2020-02-09 NOTE — Transfer of Care (Signed)
Immediate Anesthesia Transfer of Care Note  Patient: Courtney [name]  Procedure(s) Performed: POST PARTUM TUBAL LIGATION (Bilateral Abdomen)  Patient Location: PACU  Anesthesia Type:Epidural  Level of Consciousness: awake, alert  and oriented  Airway & Oxygen Therapy: Patient Spontanous Breathing  Post-op Assessment: Report given to RN and Post -op Vital signs reviewed and stable  Post vital signs: Reviewed and stable  Last Vitals:  Vitals Value Taken Time  BP 130/54 1012  Temp    Pulse 72 1012  Resp 22 1012  SpO2 97 1012    Last Pain:  Vitals:   02/09/20 0900  TempSrc:   PainSc: 0-No pain         Complications: No apparent anesthesia complications

## 2020-02-10 DIAGNOSIS — O34219 Maternal care for unspecified type scar from previous cesarean delivery: Secondary | ICD-10-CM | POA: Diagnosis not present

## 2020-02-10 DIAGNOSIS — Z9851 Tubal ligation status: Secondary | ICD-10-CM

## 2020-02-10 LAB — CBC
HCT: 28 % — ABNORMAL LOW (ref 36.0–46.0)
Hemoglobin: 9.1 g/dL — ABNORMAL LOW (ref 12.0–15.0)
MCH: 30.2 pg (ref 26.0–34.0)
MCHC: 32.5 g/dL (ref 30.0–36.0)
MCV: 93 fL (ref 80.0–100.0)
Platelets: 127 10*3/uL — ABNORMAL LOW (ref 150–400)
RBC: 3.01 MIL/uL — ABNORMAL LOW (ref 3.87–5.11)
RDW: 13.9 % (ref 11.5–15.5)
WBC: 10 10*3/uL (ref 4.0–10.5)
nRBC: 0 % (ref 0.0–0.2)

## 2020-02-10 MED ORDER — IBUPROFEN 600 MG PO TABS: 600.0000 mg | ORAL_TABLET | Freq: Four times a day (QID) | ORAL | 0 refills | Status: DC

## 2020-02-10 NOTE — Progress Notes (Signed)
MOB was referred for history of depression/anxiety. * Referral screened out by Clinical Social Worker because none of the following criteria appear to apply: ~ History of anxiety/depression during this pregnancy, or of post-partum depression following prior delivery. ~ Diagnosis of anxiety and/or depression within last 3 years. Per further chart review, it appears that MOB was diagnosed with depression in 2016.  OR * MOB's symptoms currently being treated with medication and/or therapy.   Please contact the Clinical Social Worker if needs arise, by MOB request, or if MOB scores greater than 9/yes to question 10 on Edinburgh Postpartum Depression Screen.    Chantia Amalfitano S. Zoey Bidwell, MSW, LCSW [facility] [phone]  

## 2020-02-10 NOTE — Discharge Summary (Signed)
Postpartum Discharge Summary  Date of Service updated     Patient Name: Courtney [name] DOB: 1991-04-21 MRN: [id number]  Date of admission: 02/09/2020 Delivery date:02/09/2020  Delivering provider: Jonnie Kind  Date of discharge: 02/10/2020  Admitting diagnosis: Labor and delivery, indication for care [O75.9] Intrauterine pregnancy: [redacted]w[redacted]d    Secondary diagnosis:  Active Problems:   Labor and delivery, indication for care   VBAC (vaginal birth after Cesarean)   Status post tubal ligation  Additional problems: none    Discharge diagnosis: Term Pregnancy Delivered and VBAC                                              Post partum procedures:postpartum tubal ligation Augmentation: N/A Complications: None  Hospital course: Onset of Labor With Vaginal Delivery     (VBAC) 29y.o. yo [id number] admitted in Active Labor on 02/09/2020. Patient had an uncomplicated labor course as follows:  Spontaneous labor with successful VBAC. No complications.  Intact perineum Membrane Rupture Time/Date: 7:17 AM ,02/09/2020   Delivery Method:VBAC, Spontaneous  Episiotomy: None  Lacerations:  None  Patient had an uncomplicated postpartum course.  She is ambulating, tolerating a regular diet, passing flatus, and urinating well. Patient is discharged home in stable condition on 02/10/20.  Newborn Data: Birth date:02/09/2020  Birth time:8:00 AM  Gender:Female  Living status:Living  Apgars:8 ,9  Weight:3745 g   Magnesium Sulfate received: No BMZ received: No Rhophylac:N/A MMR:No T-DaP:Given prenatally Flu: N/A Transfusion:No  Physical exam  Vitals:   02/09/20 1800 02/09/20 2155 02/10/20 0204 02/10/20 0559  BP: 121/70 105/66 117/73 118/76  Pulse: 80 78 72 68  Resp: _0 Temp: 98 F (36.7 C) 97.9 F (36.6 C) 97.8 F (36.6 C) 97.6 F (36.4 C)  TempSrc: Oral Oral  Oral  SpO2: 99% 98% 100% 100%   General: alert, cooperative and no distress Lochia:  appropriate Uterine Fundus: firm Incision: Healing well with no significant drainage DVT Evaluation: No evidence of DVT seen on physical exam. Labs: Lab Results  Component Value Date   WBC 11.7 (H) 02/09/2020   HGB 11.4 (L) 02/09/2020   HCT 34.7 (L) 02/09/2020   MCV 91.8 02/09/2020   PLT 167 02/09/2020   CMP Latest Ref Rng & Units 01/29/2020  Glucose 70 - 99 mg/dL 107(H)  BUN 6 - 20 mg/dL 11  Creatinine 0.44 - 1.00 mg/dL 0.48  Sodium 135 - 145 mmol/L 138  Potassium 3.5 - 5.1 mmol/L 4.1  Chloride 98 - 111 mmol/L 105  CO2 22 - 32 mmol/L 22  Calcium 8.9 - 10.3 mg/dL 9.3  Total Protein 6.5 - 8.1 g/dL 6.9  Total Bilirubin 0.3 - 1.2 mg/dL 0.5  Alkaline Phos 38 - 126 U/L 96  AST 15 - 41 U/L 16  ALT 0 - 44 U/L 13   Edinburgh Score: Edinburgh Postnatal Depression Scale Screening Tool 04/04/2019  I have been able to laugh and see the funny side of things. 0  I have looked forward with enjoyment to things. 0  I have blamed myself unnecessarily when things went wrong. 0  I have been anxious or worried for no good reason. 0  I have felt scared or panicky for no good reason. 0  Things have been getting on top of me. 1  I have been so  unhappy that I have had difficulty sleeping. 0  I have felt sad or miserable. 0  I have been so unhappy that I have been crying. 0  The thought of harming myself has occurred to me. 0  Edinburgh Postnatal Depression Scale Total 1      After visit meds:  Allergies as of 02/10/2020      Reactions   No Known Allergies       Medication List    TAKE these medications   Comfort Fit Maternity Supp Lg Misc 1 Units by Does not apply route daily.   ibuprofen 600 MG tablet Commonly known as: ADVIL Take 1 tablet (600 mg total) by mouth every 6 (six) hours.   omeprazole 20 MG capsule Commonly known as: PRILOSEC Take 20 mg by mouth daily.   Vitafol Gummies 3.33-0.333-34.8 MG Chew Chew 3 each by mouth daily.        Discharge home in stable  condition Infant Feeding: Bottle and Breast Infant Disposition:home with mother Discharge instruction: per After Visit Summary and Postpartum booklet. Activity: Advance as tolerated. Pelvic rest for 6 weeks.  Diet: routine diet Anticipated Birth Control: BTL done PP Postpartum Appointment:4 weeks Additional Postpartum F/U: none Future Appointments: Future Appointments  Date Time Provider [place]  02/13/2020  1:30 PM Chancy Milroy, MD [place] None   Follow up Visit: [place]. Schedule an appointment as soon as possible for a visit in 4 week(s).   Specialty: Obstetrics and Gynecology Contact information: [address], [address] [place] [phone]              02/10/2020 Hansel Feinstein, CNM

## 2020-02-10 NOTE — Discharge Instructions (Signed)
Postpartum Tubal Ligation, Care After This sheet gives you information about how to care for yourself after your procedure. Your health care provider may also give you more specific instructions. If you have problems or questions, contact your health care provider. What can I expect after the procedure? After the procedure, you may have:  A sore throat.  Bruising or pain in your back.  Nausea or vomiting.  Dizziness.  Mild abdominal discomfort or pain, such as cramping, gas pain, or feeling bloated.  Soreness around the incision area.  Tiredness.  Pain in your shoulders. Follow these instructions at home: Medicines  Ask your health care provider if the medicine prescribed to you: ? Requires you to avoid driving or using heavy machinery. ? Can cause constipation. You may need to take actions to prevent or treat constipation, such as:  Drink enough fluid to keep your urine pale yellow.  Take over-the-counter or prescription medicines.  Eat foods that are high in fiber, such as beans, whole grains, and fresh fruits and vegetables.  Limit foods that are high in fat and processed sugars, such as fried or sweet foods.  Do not take aspirin because it can cause bleeding. Activity  Rest for the remainder of the day.  Return to your normal activities as told by your health care provider. Ask your health care provider what activities are safe for you.  Do not have sex, douche, or put a tampon or anything else in your vagina for 6 weeks or as long as told by your health care provider.  Do not lift anything that is heavier than your baby for 2 weeks, or the limit that you are told, until your health care provider says that it is safe. Incision care      Follow instructions from your health care provider about how to take care of your incision. Make sure you: ? Wash your hands with soap and water before and after you change your bandage (dressing). If soap and water are not  available, use hand sanitizer. ? Change your dressing as told by your health care provider. ? Leave stitches (sutures), skin glue, or adhesive strips in place. These skin closures may need to stay in place for 2 weeks or longer. If adhesive strip edges start to loosen and curl up, you may trim the loose edges. Do not remove adhesive strips completely unless your health care provider tells you to do that.  Check your incision area every day for signs of infection. Check for: ? Redness, swelling, or pain. ? Fluid or blood. ? Warmth. ? Pus or a bad smell. Other Instructions  Do not take baths, swim, or use a hot tub until your health care provider approves. Ask your health care provider if you may take showers. You may only be allowed to take sponge baths.  Keep all follow-up visits as told by your health care provider. This is important. Contact a health care provider if:  You have redness, swelling, or pain around your incision.  Your incision feels warm to the touch.  Your pain does not improve after 2-3 days.  You have a rash.  You repeatedly become dizzy or lightheaded.  Your pain medicine is not helping.  You are constipated. Get help right away if you:  Have a fever.  Faint.  Have pain in your abdomen that gets worse.  Have fluid or blood coming from your incision.  You have pus or a bad smell coming from your incision.  The  edges of your incision break open after the sutures have been removed.  Have shortness of breath or trouble breathing.  Have chest pain or leg pain.  Have ongoing nausea or diarrhea. Summary  Mild abdominal discomfort is common after this procedure.  Contact your health care provider if you experience problems or have concerns.  Do not lift anything that is heavier than your baby for 2 weeks, or the limit that you are told, until your health care provider says that it is safe.  Keep all follow-up visits as told by your health care  provider. This is important. This information is not intended to replace advice given to you by your health care provider. Make sure you discuss any questions you have with your health care provider. Document Revised: 02/11/2019 Document Reviewed: 07/19/2018 Elsevier Patient Education  2020 [employer] Postpartum Care After Vaginal Delivery This sheet gives you information about how to care for yourself from the time you deliver your baby to up to 6-12 weeks after delivery (postpartum period). Your health care provider may also give you more specific instructions. If you have problems or questions, contact your health care provider. Follow these instructions at home: Vaginal bleeding  It is normal to have vaginal bleeding (lochia) after delivery. Wear a sanitary pad for vaginal bleeding and discharge. ? During the first week after delivery, the amount and appearance of lochia is often similar to a menstrual period. ? Over the next few weeks, it will gradually decrease to a dry, yellow-brown discharge. ? For most women, lochia stops completely by 4-6 weeks after delivery. Vaginal bleeding can vary from woman to woman.  Change your sanitary pads frequently. Watch for any changes in your flow, such as: ? A sudden increase in volume. ? A change in color. ? Large blood clots.  If you pass a blood clot from your vagina, save it and call your health care provider to discuss. Do not flush blood clots down the toilet before talking with your health care provider.  Do not use tampons or douches until your health care provider says this is safe.  If you are not breastfeeding, your period should return 6-8 weeks after delivery. If you are feeding your child breast milk only (exclusive breastfeeding), your period may not return until you stop breastfeeding. Perineal care  Keep the area between the vagina and the anus (perineum) clean and dry as told by your health care provider. Use medicated pads  and pain-relieving sprays and creams as directed.  If you had a cut in the perineum (episiotomy) or a tear in the vagina, check the area for signs of infection until you are healed. Check for: ? More redness, swelling, or pain. ? Fluid or blood coming from the cut or tear. ? Warmth. ? Pus or a bad smell.  You may be given a squirt bottle to use instead of wiping to clean the perineum area after you go to the bathroom. As you start healing, you may use the squirt bottle before wiping yourself. Make sure to wipe gently.  To relieve pain caused by an episiotomy, a tear in the vagina, or swollen veins in the anus (hemorrhoids), try taking a warm sitz bath 2-3 times a day. A sitz bath is a warm water bath that is taken while you are sitting down. The water should only come up to your hips and should cover your buttocks. Breast care  Within the first few days after delivery, your breasts may feel heavy,  full, and uncomfortable (breast engorgement). Milk may also leak from your breasts. Your health care provider can suggest ways to help relieve the discomfort. Breast engorgement should go away within a few days.  If you are breastfeeding: ? Wear a bra that supports your breasts and fits you well. ? Keep your nipples clean and dry. Apply creams and ointments as told by your health care provider. ? You may need to use breast pads to absorb milk that leaks from your breasts. ? You may have uterine contractions every time you breastfeed for up to several weeks after delivery. Uterine contractions help your uterus return to its normal size. ? If you have any problems with breastfeeding, work with your health care provider or Advertising copywriter.  If you are not breastfeeding: ? Avoid touching your breasts a lot. Doing this can make your breasts produce more milk. ? Wear a good-fitting bra and use cold packs to help with swelling. ? Do not squeeze out (express) milk. This causes you to make more  milk. Intimacy and sexuality  Ask your health care provider when you can engage in sexual activity. This may depend on: ? Your risk of infection. ? How fast you are healing. ? Your comfort and desire to engage in sexual activity.  You are able to get pregnant after delivery, even if you have not had your period. If desired, talk with your health care provider about methods of birth control (contraception). Medicines  Take over-the-counter and prescription medicines only as told by your health care provider.  If you were prescribed an antibiotic medicine, take it as told by your health care provider. Do not stop taking the antibiotic even if you start to feel better. Activity  Gradually return to your normal activities as told by your health care provider. Ask your health care provider what activities are safe for you.  Rest as much as possible. Try to rest or take a nap while your baby is sleeping. Eating and drinking   Drink enough fluid to keep your urine pale yellow.  Eat high-fiber foods every day. These may help prevent or relieve constipation. High-fiber foods include: ? Whole grain cereals and breads. ? Brown rice. ? Beans. ? Fresh fruits and vegetables.  Do not try to lose weight quickly by cutting back on calories.  Take your prenatal vitamins until your postpartum checkup or until your health care provider tells you it is okay to stop. Lifestyle  Do not use any products that contain nicotine or tobacco, such as cigarettes and e-cigarettes. If you need help quitting, ask your health care provider.  Do not drink alcohol, especially if you are breastfeeding. General instructions  Keep all follow-up visits for you and your baby as told by your health care provider. Most women visit their health care provider for a postpartum checkup within the first 3-6 weeks after delivery. Contact a health care provider if:  You feel unable to cope with the changes that your child  brings to your life, and these feelings do not go away.  You feel unusually sad or worried.  Your breasts become red, painful, or hard.  You have a fever.  You have trouble holding urine or keeping urine from leaking.  You have little or no interest in activities you used to enjoy.  You have not breastfed at all and you have not had a menstrual period for 12 weeks after delivery.  You have stopped breastfeeding and you have not had a menstrual  period for 12 weeks after you stopped breastfeeding.  You have questions about caring for yourself or your baby.  You pass a blood clot from your vagina. Get help right away if:  You have chest pain.  You have difficulty breathing.  You have sudden, severe leg pain.  You have severe pain or cramping in your lower abdomen.  You bleed from your vagina so much that you fill more than one sanitary pad in one hour. Bleeding should not be heavier than your heaviest period.  You develop a severe headache.  You faint.  You have blurred vision or spots in your vision.  You have bad-smelling vaginal discharge.  You have thoughts about hurting yourself or your baby. If you ever feel like you may hurt yourself or others, or have thoughts about taking your own life, get help right away. You can go to the nearest emergency department or call:  Your local emergency services (911 in the [place]  A suicide crisis helpline, such as the National Suicide Prevention Lifeline at 941-592-9384. This is open 24 hours a day. Summary  The period of time right after you deliver your newborn up to 6-12 weeks after delivery is called the postpartum period.  Gradually return to your normal activities as told by your health care provider.  Keep all follow-up visits for you and your baby as told by your health care provider. This information is not intended to replace advice given to you by your health care provider. Make sure you discuss any questions you  have with your health care provider. Document Revised: 09/01/2017 Document Reviewed: 06/12/2017 Elsevier Patient Education  2020 [employer].

## 2020-02-13 ENCOUNTER — Encounter: Payer: Self-pay | Admitting: *Deleted

## 2020-02-13 ENCOUNTER — Encounter: Payer: Medicaid Other | Admitting: Obstetrics and Gynecology

## 2020-02-26 ENCOUNTER — Other Ambulatory Visit: Payer: Medicaid Other

## 2020-03-03 ENCOUNTER — Encounter: Payer: Self-pay | Admitting: Obstetrics and Gynecology

## 2020-03-03 ENCOUNTER — Telehealth (INDEPENDENT_AMBULATORY_CARE_PROVIDER_SITE_OTHER): Payer: Medicaid Other | Admitting: Obstetrics and Gynecology

## 2020-03-03 DIAGNOSIS — R300 Dysuria: Secondary | ICD-10-CM | POA: Insufficient documentation

## 2020-03-03 NOTE — Progress Notes (Signed)
     I connected with@ on 03/03/20 at  4:00 PM EDT by: Mychart video and verified that I am speaking with the correct person using two identifiers.  Patient is located at home and provider is located at [facility] for [employer] at [place] .     The purpose of this virtual visit is to provide medical care while limiting exposure to the novel coronavirus. I discussed the limitations, risks, security and privacy concerns of performing an evaluation and management service by video and the availability of in person appointments. I also discussed with the patient that there may be a patient responsible charge related to this service. By engaging in this virtual visit, you consent to the provision of healthcare.  Additionally, you authorize for your insurance to be billed for the services provided during this visit.  The patient expressed understanding and agreed to proceed.  The following staff members participated in the virtual visit:    Post Partum Visit Note Subjective:   Courtney [name] is a 29 y.o. [phone] female being evaluated for postpartum followup.  She is 3 weeks postpartum following a VBAC and [redacted]w[redacted]d at gestational weeks.  I have fully reviewed the prenatal and intrapartum course; pregnancy complicated by unremarkable.  Postpartum course has been unremarkable. Baby is doing well. Baby is feeding by bottle - Gentle Pro. Bleeding pink. Bowel function is normal. Bladder function is burning while voiding. Patient is not sexually active. Contraception method is tubal ligation. Postpartum depression screening: negative.  The following portions of the patient's history were reviewed and updated as appropriate: allergies, current medications, past family history, past medical history, past social history, past surgical history and problem list.  Review of Systems Pertinent items noted in HPI and remainder of comprehensive ROS otherwise negative.   Objective:  There were no vitals filed for  this visit. Self-Obtained       Assessment:    Nl  postpartum exam.  Dysuria Plan:  Essential components of care per ACOG recommendations:  1.  Mood and well being: Patient with negative depression screening today. Reviewed local resources for support.  - Patient does not use tobacco. - hx of drug use? No   2. Infant care and feeding:  -Patient currently breastmilk feeding? No  -Social determinants of health (SDOH) reviewed in EPIC. No concerns  3. Sexuality, contraception and birth spacing - Patient does not want a pregnancy in the next year.  Desired family size is completed children.  - Reviewed forms of contraception in tiered fashion. Patient desired bilateral tubal ligation today.   - Discussed birth spacing of 18 months  4. Sleep and fatigue -Encouraged family/partner/community support of 4 hrs of uninterrupted sleep to help with mood and fatigue  5. Physical Recovery  - Discussed patients delivery. - Patient had a no  Laceration. - Patient has urinary incontinence? No - Patient is safe to resume physical and sexual activity  6.  Health Maintenance - Last pap smear done 12/20 and was normal with postive HPV.   7. No Chronic Disease - PCP follow up  10 minutes of non-face-to-face time spent with the patient    Dalphine Handing, [facility] for [employer], [facility]

## 2020-03-12 ENCOUNTER — Ambulatory Visit (HOSPITAL_BASED_OUTPATIENT_CLINIC_OR_DEPARTMENT_OTHER): Payer: Medicaid Other

## 2020-03-12 ENCOUNTER — Other Ambulatory Visit (HOSPITAL_COMMUNITY)
Admission: RE | Admit: 2020-03-12 | Discharge: 2020-03-12 | Disposition: A | Discharge status: Home / Self Care | Payer: Medicaid Other | Source: Ambulatory Visit | Attending: Obstetrics and Gynecology | Admitting: Obstetrics and Gynecology

## 2020-03-12 ENCOUNTER — Other Ambulatory Visit: Payer: Self-pay

## 2020-03-12 DIAGNOSIS — N898 Other specified noninflammatory disorders of vagina: Secondary | ICD-10-CM | POA: Diagnosis not present

## 2020-03-12 LAB — POCT URINALYSIS DIPSTICK
Bilirubin, UA: NEGATIVE
Glucose, UA: NEGATIVE
Leukocytes, UA: NEGATIVE
Nitrite, UA: NEGATIVE
Protein, UA: NEGATIVE
Spec Grav, UA: 1.015 (ref 1.010–1.025)
Urobilinogen, UA: 0.2 E.U./dL
pH, UA: 7 (ref 5.0–8.0)

## 2020-03-12 NOTE — Progress Notes (Signed)
Patient was assessed and managed by nursing staff during this encounter. I have reviewed the chart and agree with the documentation and plan. I have also made any necessary editorial changes.  Catalina Antigua, MD 03/12/2020 2:42 PM

## 2020-03-12 NOTE — Progress Notes (Signed)
SUBJECTIVE: Courtney [name] is a 29 y.o. female who complains of vaginal irritation while voiding and vaginal discharge without flank pain, fever, chills, or bleeding.  OBJECTIVE: Appears well, in no apparent distress. Urine dipstick trace Ketones, trace blood   ASSESSMENT: Vaginal irritation   PLAN: Treatment per orders.  Call or return to clinic prn if these symptoms worsen or fail to improve as anticipated.

## 2020-03-13 LAB — CERVICOVAGINAL ANCILLARY ONLY
Bacterial Vaginitis (gardnerella): POSITIVE — AB
Candida Glabrata: NEGATIVE
Candida Vaginitis: NEGATIVE
Chlamydia: NEGATIVE
Comment: NEGATIVE
Comment: NEGATIVE
Comment: NEGATIVE
Comment: NEGATIVE
Comment: NEGATIVE
Comment: NORMAL
Neisseria Gonorrhea: NEGATIVE
Trichomonas: POSITIVE — AB

## 2020-03-14 LAB — URINE CULTURE

## 2020-03-17 ENCOUNTER — Encounter: Payer: Self-pay | Admitting: Obstetrics & Gynecology

## 2020-03-17 ENCOUNTER — Other Ambulatory Visit: Payer: Self-pay | Admitting: Obstetrics & Gynecology

## 2020-03-17 DIAGNOSIS — A5901 Trichomonal vulvovaginitis: Secondary | ICD-10-CM

## 2020-03-17 DIAGNOSIS — N760 Acute vaginitis: Secondary | ICD-10-CM

## 2020-03-17 HISTORY — DX: Trichomonal vulvovaginitis: A59.01

## 2020-03-17 MED ORDER — METRONIDAZOLE 500 MG PO TABS: 500.0000 mg | ORAL_TABLET | Freq: Two times a day (BID) | ORAL | 1 refills | Status: AC

## 2020-03-19 ENCOUNTER — Telehealth: Payer: Self-pay

## 2020-03-19 NOTE — Telephone Encounter (Signed)
S/w pt and advised of results, rx sent and treatment plan

## 2020-08-24 ENCOUNTER — Encounter: Payer: Self-pay | Admitting: General Practice

## 2021-01-31 IMAGING — US US MFM OB FOLLOW-UP
1 series · 13 of 28 positions shown · non-contrast
Comparison: none

[Series 1: us mfm ob follow-up · 13 of 40 slices shown]
[im 2/40]
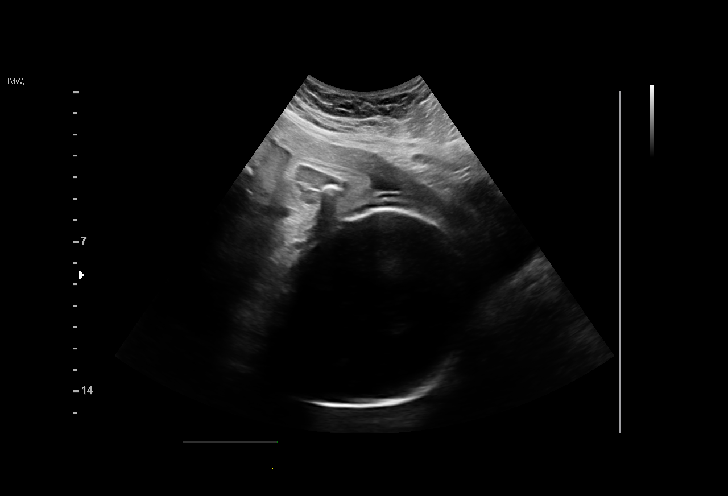
[im 5/40]
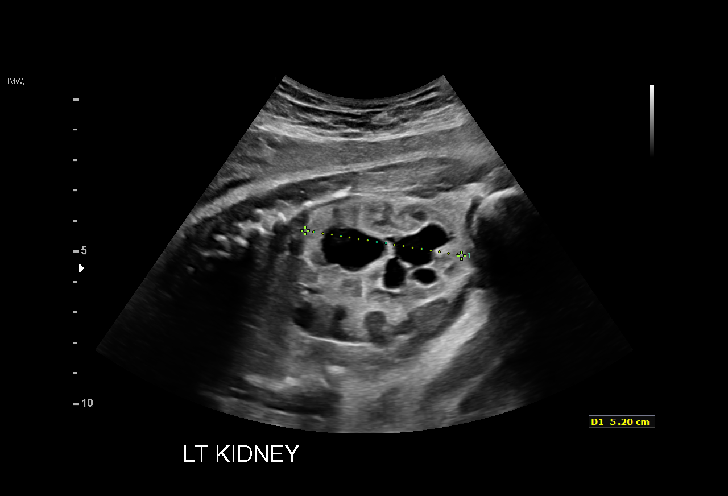
[im 8/40]
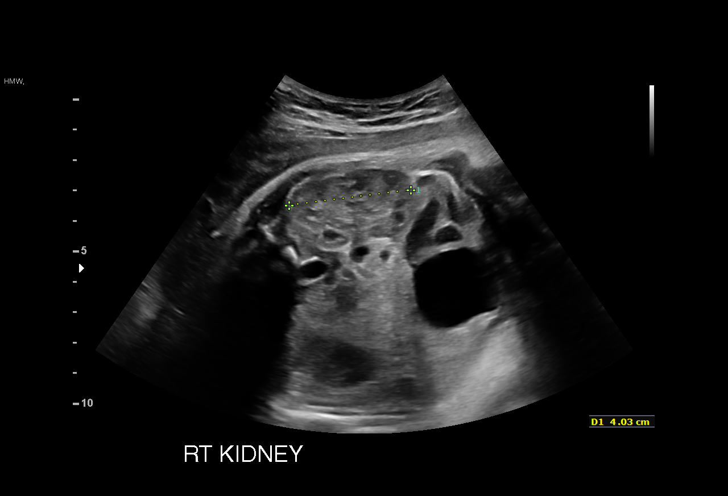
[im 11/40]
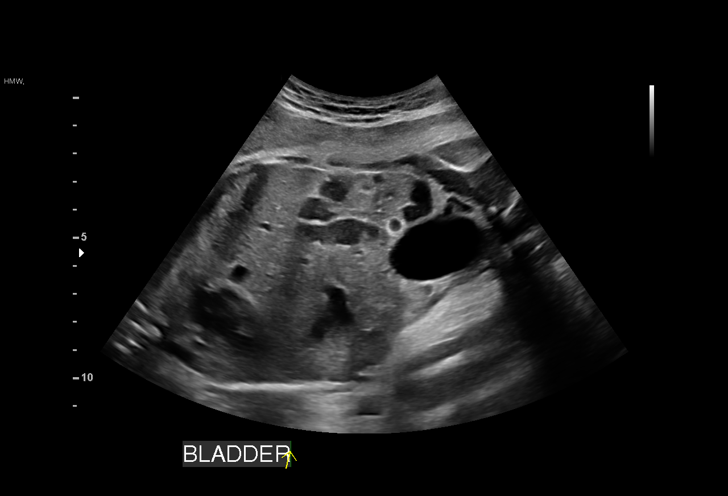
[im 14/40]
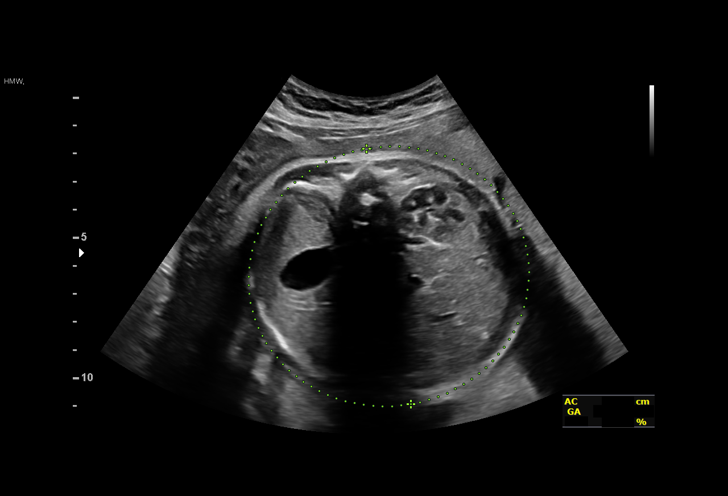
[im 16/40]
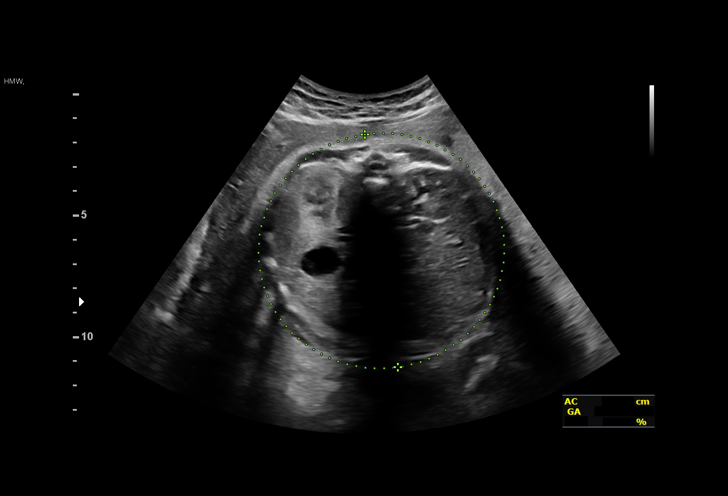
[im 21/40]
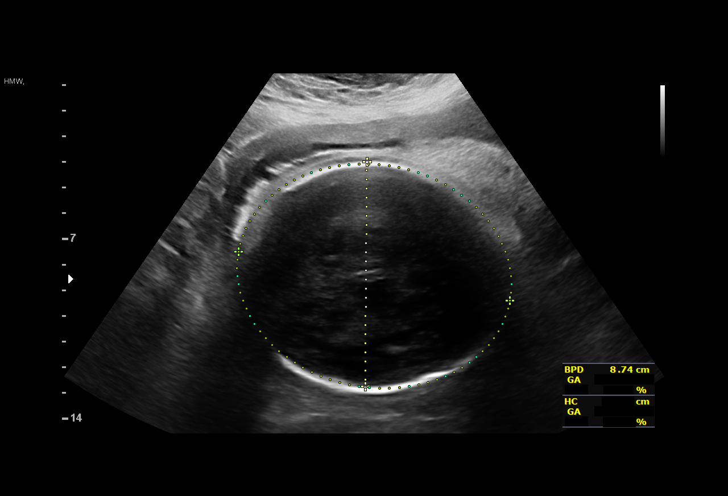
[im 24/40]
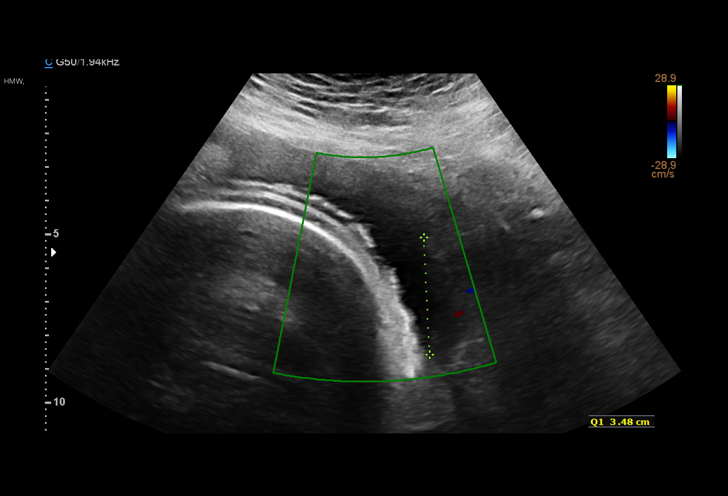
[im 27/40]
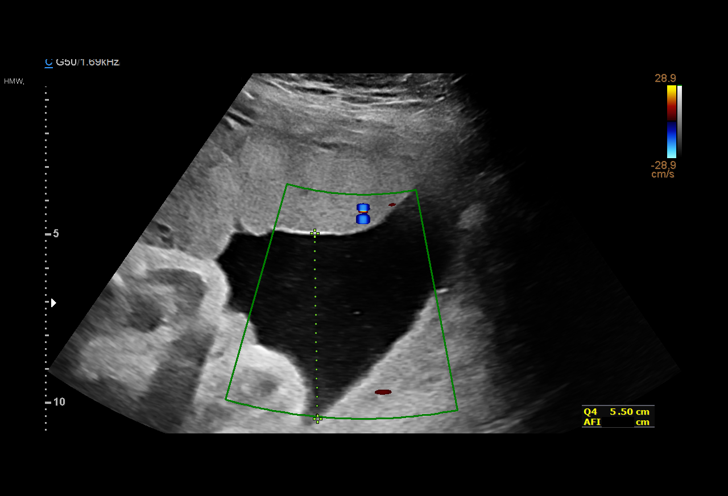
[im 29/40]
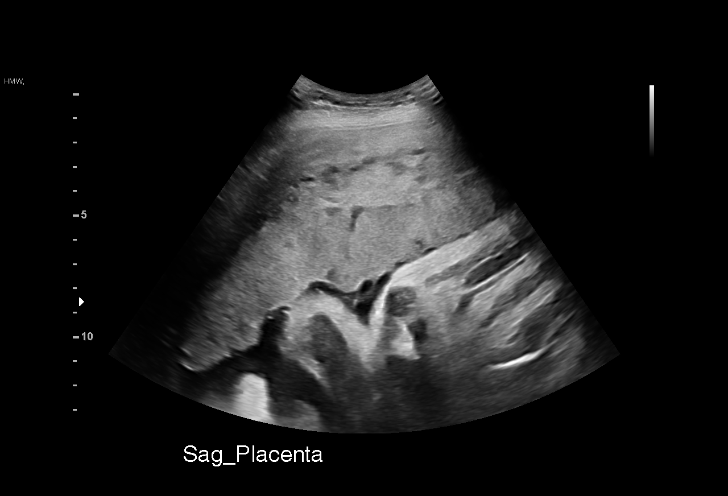
[im 32/40]
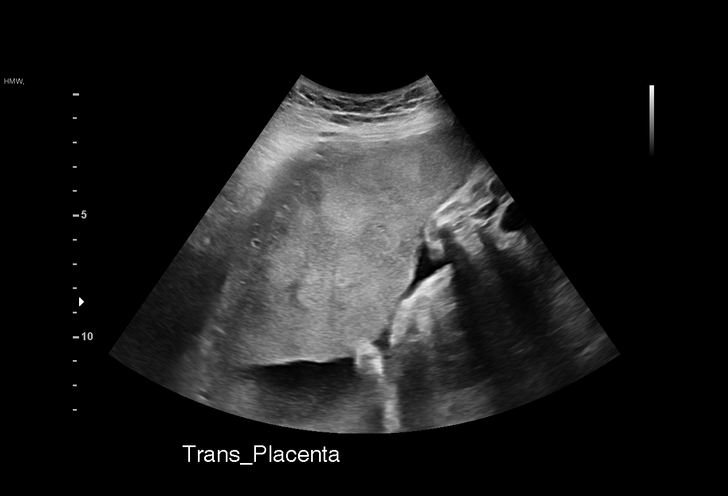
[im 35/40]
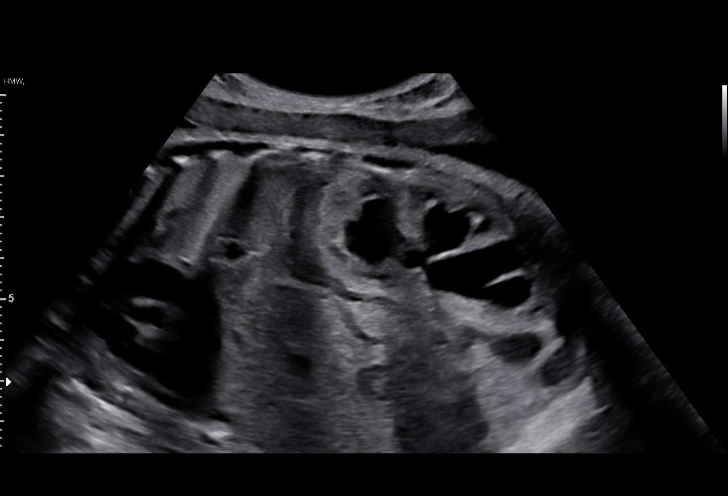
[im 38/40]
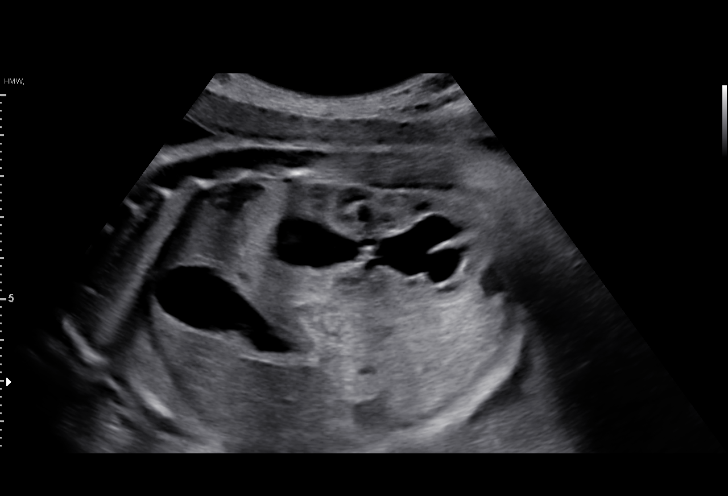

[13 of 28 positions shown; findings below may reference images not displayed]

----------------------------------------------------------------------

 ----------------------------------------------------------------------
Indications

  Fetal abnormality - other known or
  suspected (renal caliectasis)
  34 weeks gestation of pregnancy
  Encounter for other antenatal screening
  follow-up
 ----------------------------------------------------------------------
Fetal Evaluation

 Num Of Fetuses:         1
 Fetal Heart Rate(bpm):  136
 Cardiac Activity:       Observed
 Presentation:           Cephalic
 Placenta:               Posterior
 P. Cord Insertion:      Previously Visualized

 Amniotic Fluid
 AFI FV:      Within normal limits

 AFI Sum(cm)     %Tile       Largest Pocket(cm)
 12.88           40

 RUQ(cm)       RLQ(cm)       LUQ(cm)        LLQ(cm)

Biometry

 BPD:      87.6  mm     G. Age:  35w 3d         84  %    CI:         81.3   %    70 - 86
                                                         FL/HC:      20.7   %    19.4 -
 HC:      306.7  mm     G. Age:  34w 1d         19  %    HC/AC:      1.00        0.96 -
 AC:      307.5  mm     G. Age:  34w 5d         74  %    FL/BPD:     72.5   %    71 - 87
 FL:       63.5  mm     G. Age:  32w 6d         14  %    FL/AC:      20.7   %    20 - 24
 Est. FW:    5405  gm      5 lb 4 oz     49  %
OB History

 Gravidity:    5         Term:   3        Prem:   0        SAB:   1
 TOP:          0       Ectopic:  0        Living: 3
Gestational Age

 LMP:           34w 0d        Date:  05/13/19                 EDD:   02/17/20
 U/S Today:     34w 2d                                        EDD:   02/15/20
 Best:          34w 0d     Det. By:  LMP  (05/13/19)          EDD:   02/17/20
Anatomy

 Cranium:               Appears normal         Aortic Arch:            Previously seen
 Cavum:                 Previously seen        Ductal Arch:            Previously seen
 Ventricles:            Previously seen        Diaphragm:              Previously seen
 Choroid Plexus:        Resolved CPC           Stomach:                Appears normal, left
                                                                       sided
 Cerebellum:            Previously seen        Abdomen:                Previously seen
 Posterior Fossa:       Previously seen        Abdominal Wall:         Previously seen
 Nuchal Fold:           Previously seen        Cord Vessels:           Previously seen
 Face:                  Orbits and profile     Kidneys:                Left caliectasis
                        previously seen
 Lips:                  Previously seen        Bladder:                Appears normal
 Thoracic:              Appears normal         Spine:                  Previously seen
 Heart:                 Previously seen        Upper Extremities:      Previously seen
 RVOT:                  Previously seen        Lower Extremities:      Previously seen
 LVOT:                  Previously seen

 Other:  Heels/feet and hands/right 5th digit visualized previously. Nasal bone
         visualized previously.
Cervix Uterus Adnexa

 Cervix
 Not visualized (advanced GA >75wks)

 Uterus
 No abnormality visualized.

 Left Ovary
 No adnexal mass visualized.

 Right Ovary
 No adnexal mass visualized.

 Cul De Sac
 No free fluid seen.

 Adnexa
 No abnormality visualized.
Comments

 This patient was seen for a follow up growth scan due to left
 calyectasis/hydronephrosis that was noted during her prior
 ultrasound exams.  She denies any problems since her last
 exam.
 She was informed that the fetal growth and amniotic fluid
 level appears appropriate for her gestational age.
 Left calyectasis/hydronephrosis continues to be noted today.
 The right fetal kidney appears within normal limits.  The
 patient was advised that her baby will most likely require
 further imaging studies of the kidneys and possibly
 prophylactic antibiotics after birth.  The pediatricians should
 be notified regarding the calyectasis/hydronephrosis that was
 noted during her prenatal ultrasound exams.
 No further exams were scheduled in our office.

## 2021-09-27 ENCOUNTER — Ambulatory Visit (HOSPITAL_COMMUNITY)
Admission: EM | Admit: 2021-09-27 | Discharge: 2021-09-27 | Disposition: A | Discharge status: Home / Self Care | Payer: Medicaid Other | Attending: Physician Assistant | Admitting: Physician Assistant

## 2021-09-27 ENCOUNTER — Other Ambulatory Visit: Payer: Self-pay

## 2021-09-27 ENCOUNTER — Encounter (HOSPITAL_COMMUNITY): Payer: Self-pay

## 2021-09-27 DIAGNOSIS — K529 Noninfective gastroenteritis and colitis, unspecified: Secondary | ICD-10-CM | POA: Insufficient documentation

## 2021-09-27 DIAGNOSIS — Z20822 Contact with and (suspected) exposure to covid-19: Secondary | ICD-10-CM | POA: Insufficient documentation

## 2021-09-27 DIAGNOSIS — R52 Pain, unspecified: Secondary | ICD-10-CM | POA: Diagnosis present

## 2021-09-27 DIAGNOSIS — R1110 Vomiting, unspecified: Secondary | ICD-10-CM | POA: Diagnosis present

## 2021-09-27 LAB — POC INFLUENZA A AND B ANTIGEN (URGENT CARE ONLY)
INFLUENZA A ANTIGEN, POC: NEGATIVE
INFLUENZA B ANTIGEN, POC: NEGATIVE

## 2021-09-27 MED ORDER — ONDANSETRON HCL 4 MG PO TABS: 4.0000 mg | ORAL_TABLET | Freq: Four times a day (QID) | ORAL | 0 refills | Status: DC

## 2021-09-27 NOTE — ED Triage Notes (Signed)
Pt presents with abdominal cramping that comes and goes.   States she has been vomiting and experiencing diarrhea X 24 hours.   States she has been dizzy. States she felt fresh air and felt better.

## 2021-09-27 NOTE — Discharge Instructions (Signed)
Take Zofran as needed for nausea Recommend Imodium as needed for diarrhea Drink plenty of fluids Return if symptoms become worse.  COVID test pending

## 2021-09-27 NOTE — ED Provider Notes (Signed)
[place]    CSN: [id number] Arrival date & time: 09/27/21  1635      History   Chief Complaint Chief Complaint  Patient presents with   Emesis   Generalized Body Aches    HPI Courtney [name] is a 31 y.o. female.   Pt complains of n/v/d that started 24 hours ago.  Denies chill, cough, congestion, body aches.  She has taken nothing for her sx. She reports subjective intermittent fevers at home and epigastric cramping.  Three small children at home with similar sx.      Past Medical History:  Diagnosis Date   Allergic rhinitis    Back pain    Common migraine with intractable migraine 12/16/2014   migraines PRN meds   Depression    denies   Eczema    GERD (gastroesophageal reflux disease)    takes meds PRN    Infection    UTI   Scoliosis    Short interval between pregnancies complicating pregnancy, antepartum 08/20/2018   Supervision of normal pregnancy 01/17/2018   BABYSCRIPTS PATIENT: [x ]Initial [x ]12 [x [id number] [ x]28 [x] 32 [x ]36 [ ] 38 [ ] 39 [ ] 40  Nursing Staff Provider Office Location  [place] Dating  LMP c/w 6 week [place] Language  ENG Anatomy US  Wnl, incomplete Flu Vaccine  Declined 12/9 Genetic Screen  NIPS:wnl   AFP:  wnl    TDaP vaccine   info given 12/10/18 Hgb A1C or  GTT Early  Third trimester  Rhogam     LAB RESULTS  Feeding Plan BREAST  Blood Type -   Supervision of other normal pregnancy, antepartum 01/17/2018   BABYSCRIPTS PATIENT: [x ]Initial [x ]12 [x [id number] [ x]28 [x] 32 [x ]36 [ ] 38 [ ] 39 [ ] 40  Nursing Staff Provider Office Location  [place] Dating  LMP c/w 6 week [place] Language  ENG Anatomy US  Wnl, incomplete Flu Vaccine  Declined 12/9 Genetic Screen  NIPS:wnl   AFP:  wnl    TDaP vaccine   info given 12/10/18 Hgb A1C or  GTT Early  Third trimester  Rhogam     LAB RESULTS  Feeding Plan BREAST  Blood Type -   Trichomonal vaginitis 03/17/2020   Vitamin D deficiency     Patient Active Problem List   Diagnosis Date Noted   Trichomonal vaginitis 03/17/2020    Status post tubal ligation 02/10/2020   Cervical high risk HPV (human papillomavirus) test positive 123456   Umbilical hernia without obstruction and without gangrene 10/15/2018   Obesity with body mass index 30 or greater 06/12/2017   Common migraine with intractable migraine 12/16/2014    Past Surgical History:  Procedure Laterality Date   CESAREAN SECTION N/A 01/17/2018   Procedure: CESAREAN SECTION;  Surgeon: Allyn Kenner, DO;  Location: [facility];  Service: Obstetrics;  Laterality: N/A;   DILATION AND EVACUATION N/A 05/06/2015   Procedure: DILATATION AND EVACUATION;  Surgeon: Sherlyn Hay, DO;  Location: [facility];  Service: Gynecology;  Laterality: N/A;   TUBAL LIGATION Bilateral 02/09/2020   Procedure: POST PARTUM TUBAL LIGATION;  Surgeon: Aletha Halim, MD;  Location: [facility];  Service: Gynecology;  Laterality: Bilateral;    OB History     Gravida  5   Para  4   Term  4   Preterm      AB  1   Living  4      SAB  1   IAB  Ectopic      Multiple  0   Live Births  4            Home Medications    Prior to Admission medications   Medication Sig Start Date End Date Taking? Authorizing Provider  ondansetron (ZOFRAN) 4 MG tablet Take 1 tablet (4 mg total) by mouth every 6 (six) hours. 09/27/21  Yes Ward, Tylene FantasiaJessica Z, PA-C    Family History Family History  Problem Relation Age of Onset   Migraines Mother    Asthma Mother    Healthy Sister    Healthy Brother    Healthy Brother    Healthy Brother    Cancer Maternal Grandmother    Cancer Paternal Grandfather    Cancer Maternal Aunt     Social History Social History   Tobacco Use   Smoking status: Never   Smokeless tobacco: Never  Vaping Use   Vaping Use: Never used  Substance Use Topics   Alcohol use: Not Currently    Comment: occasionally   Drug use: No     Allergies   No known allergies   Review of Systems Review of Systems  Constitutional:  Negative for  chills and fever.  HENT:  Negative for ear pain and sore throat.   Eyes:  Negative for pain and visual disturbance.  Respiratory:  Negative for cough and shortness of breath.   Cardiovascular:  Negative for chest pain and palpitations.  Gastrointestinal:  Positive for abdominal pain, diarrhea and nausea. Negative for vomiting.  Genitourinary:  Negative for dysuria and hematuria.  Musculoskeletal:  Negative for arthralgias and back pain.  Skin:  Negative for color change and rash.  Neurological:  Negative for seizures and syncope.  All other systems reviewed and are negative.   Physical Exam Triage Vital Signs ED Triage Vitals  Enc Vitals Group     BP 09/27/21 1738 117/60     Pulse Rate 09/27/21 1738 (!) 110     Resp 09/27/21 1738 17     Temp 09/27/21 1738 99.4 F (37.4 C)     Temp Source 09/27/21 1738 Oral     SpO2 09/27/21 1738 96 %     Weight --      Height --      Head Circumference --      Peak Flow --      Pain Score 09/27/21 1734 3     Pain Loc --      Pain Edu? --      Excl. in GC? --    No data found.  Updated Vital Signs BP 117/60 (BP Location: Right Arm)    Pulse (!) 110    Temp 99.4 F (37.4 C) (Oral)    Resp 17    LMP 09/09/2021 (Exact Date)    SpO2 96%   Visual Acuity Right Eye Distance:   Left Eye Distance:   Bilateral Distance:    Right Eye Near:   Left Eye Near:    Bilateral Near:     Physical Exam Vitals and nursing note reviewed.  Constitutional:      General: She is not in acute distress.    Appearance: She is well-developed.  HENT:     Head: Normocephalic and atraumatic.  Eyes:     Conjunctiva/sclera: Conjunctivae normal.  Cardiovascular:     Rate and Rhythm: Normal rate and regular rhythm.     Heart sounds: No murmur heard. Pulmonary:     Effort: Pulmonary effort is normal.  No respiratory distress.     Breath sounds: Normal breath sounds.  Abdominal:     Palpations: Abdomen is soft.     Tenderness: There is no abdominal tenderness.   Musculoskeletal:        General: No swelling.     Cervical back: Neck supple.  Skin:    General: Skin is warm and dry.     Capillary Refill: Capillary refill takes less than 2 seconds.  Neurological:     Mental Status: She is alert.  Psychiatric:        Mood and Affect: Mood normal.     UC Treatments / Results  Labs (all labs ordered are listed, but only abnormal results are displayed) Labs Reviewed  SARS CORONAVIRUS 2 (TAT 6-24 HRS)  POC INFLUENZA A AND B ANTIGEN (URGENT CARE ONLY)    EKG   Radiology No results found.  Procedures Procedures (including critical care time)  Medications Ordered in UC Medications - No data to display  Initial Impression / Assessment and Plan / UC Course  I have reviewed the triage vital signs and the nursing notes.  Pertinent labs & imaging results that were available during my care of the patient were reviewed by me and considered in my medical decision making (see chart for details).     Viral gastroenteritis. COVID pending, flu negative. Supportive treatment discussed. Zofran prescribed. Return precautions discussed.  Final Clinical Impressions(s) / UC Diagnoses   Final diagnoses:  Gastroenteritis     Discharge Instructions      Take Zofran as needed for nausea Recommend Imodium as needed for diarrhea Drink plenty of fluids Return if symptoms become worse.  COVID test pending      ED Prescriptions     Medication Sig Dispense Auth. Provider   ondansetron (ZOFRAN) 4 MG tablet Take 1 tablet (4 mg total) by mouth every 6 (six) hours. 12 tablet Ward, Lenise Arena, PA-C      PDMP not reviewed this encounter.   Ward, Lenise Arena, PA-C 09/27/21 1814

## 2021-09-28 LAB — SARS CORONAVIRUS 2 (TAT 6-24 HRS): SARS Coronavirus 2: NEGATIVE

## 2021-10-07 ENCOUNTER — Other Ambulatory Visit: Payer: Self-pay | Admitting: *Deleted

## 2021-10-07 ENCOUNTER — Encounter: Payer: Self-pay | Admitting: *Deleted

## 2021-10-11 ENCOUNTER — Other Ambulatory Visit: Payer: Self-pay

## 2021-10-11 ENCOUNTER — Encounter: Payer: Self-pay | Admitting: Psychiatry

## 2021-10-11 ENCOUNTER — Ambulatory Visit: Payer: Medicaid Other | Admitting: Psychiatry

## 2021-10-11 VITALS — BP 117/71 | HR 75 | Ht 62.0 in | Wt 195.8 lb

## 2021-10-11 DIAGNOSIS — G43709 Chronic migraine without aura, not intractable, without status migrainosus: Secondary | ICD-10-CM

## 2021-10-11 MED ORDER — PROPRANOLOL HCL 20 MG PO TABS: 20.0000 mg | ORAL_TABLET | Freq: Two times a day (BID) | ORAL | 3 refills | Status: DC

## 2021-10-11 MED ORDER — SUMATRIPTAN SUCCINATE 100 MG PO TABS: 100.0000 mg | ORAL_TABLET | ORAL | 3 refills | Status: DC | PRN

## 2021-10-11 NOTE — Patient Instructions (Addendum)
Start propranolol 20 mg twice a day for headache prevention Start Imitrex as needed for migraine. Take at the onset of migraine. If headache recurs or does not fully resolve, you may take a second dose after 2 hours. Please avoid taking more than 2 days per week to avoid rebound headache

## 2021-10-11 NOTE — Progress Notes (Signed)
Referring:  Norm Salt, PA [address] [place],  [place] [address]  PCP: Norm Salt, [place]  Neurology was asked to evaluate Courtney [name], a 31 year old female for a chief complaint of headaches.  Our recommendations of care will be communicated by shared medical record.    CC:  headaches  HPI:  Medical co-morbidities: GERD, vitamin D deficiency, eczema  The patient presents for evaluation of migraines which have been present for over 10 years. She previously saw Dr. Anne Hahn for migraines in 2016 who started her on Topamax for headache prevention. MRI brain at that time was unremarkable.  She is currently getting a migraine every 2-3 days. They are described as unilateral or holocephalic throbbing with associated photophobia, phonophobia, nausea, and vomiting. They can last an entire day. Takes ibuprofen which helps sometimes, but not always. Was started on Topamax last month which helped reduce her headaches, but it made her dizzy and she stopped it.  Headache History: Onset: ~10 years ago Triggers: none Aura: none Location: moves around Quality/Description: throbbing Associated Symptoms:  Photophobia: yes  Phonophobia: yes  Nausea: yes Vomiting: yes Worse with activity?: yes Duration of headaches: up to 24 hours   Pregnancy planning/birth control: OCPs  Headache days per month: 15 Headache free days per month: 15  Current Treatment: Abortive ibuprofen  Preventative none  Prior Therapies                                 Maxalt - lack of efficacy Hydrocodone BC powder Topamax - dizziness   Headache Risk Factors: Headache risk factors and/or co-morbidities (+) Neck Pain (-) History of Motor Vehicle Accident (-) History of Traumatic Brain Injury and/or Concussion  LABS: CBC    Component Value Date/Time   WBC 10.0 02/10/2020 0612   RBC 3.01 (L) 02/10/2020 0612   HGB 9.1 (L) 02/10/2020 0612   HGB 11.7 11/13/2019 1056   HCT 28.0 (L)  02/10/2020 0612   HCT 35.0 11/13/2019 1056   PLT 127 (L) 02/10/2020 0612   PLT 160 11/13/2019 1056   MCV 93.0 02/10/2020 0612   MCV 94 11/13/2019 1056   MCH 30.2 02/10/2020 0612   MCHC 32.5 02/10/2020 0612   RDW 13.9 02/10/2020 0612   RDW 13.1 11/13/2019 1056   LYMPHSABS 2.0 08/14/2019 1452   MONOABS 0.4 04/04/2015 2137   EOSABS 0.0 08/14/2019 1452   BASOSABS 0.0 08/14/2019 1452   CMP Latest Ref Rng & Units 01/29/2020 11/06/2018 06/16/2015  Glucose 70 - 99 mg/dL 998(P) 92 91  BUN 6 - 20 mg/dL 11 <3(A) 16  Creatinine 0.44 - 1.00 mg/dL 2.50 5.39(J) 6.73  Sodium 135 - 145 mmol/L 138 137 140  Potassium 3.5 - 5.1 mmol/L 4.1 3.7 4.5  Chloride 98 - 111 mmol/L 105 105 103  CO2 22 - 32 mmol/L 22 22 23   Calcium 8.9 - 10.3 mg/dL 9.3 ) 9.5  Total Protein 6.5 - 8.1 g/dL 6.9 6.5 -  Total Bilirubin 0.3 - 1.2 mg/dL 0.5 0.4 -  Alkaline Phos 38 - 126 U/L 96 60 -  AST 15 - 41 U/L 16 13(L) -  ALT 0 - 44 U/L 13 11 -     IMAGING:  MRI brain 06/30/15: 1.   The brain parenchyma appears normal. Incidental note is made of a prominent draining vein in the left frontal, of doubtful significance. 2.   Within the subcutaneous fat of  the right parietal region there is a nodule that is hypointense on all sequences, likely representing a large calcification or densely fibrous nodule. 3.   There are no acute findings.    Imaging independently reviewed on October 11, 2021   Current Outpatient Medications on File Prior to Visit  Medication Sig Dispense Refill   clobetasol cream (TEMOVATE) 0.05 % Apply topically 2 (two) times daily.     naproxen (NAPROSYN) 500 MG tablet Take 500 mg by mouth 2 (two) times daily as needed.     topiramate (TOPAMAX) 25 MG tablet Take 25 mg by mouth 2 (two) times daily.     gabapentin (NEURONTIN) 300 MG capsule 1 cap(s) (Patient not taking: Reported on 10/11/2021)     norgestimate-ethinyl estradiol (ORTHO-CYCLEN) 0.25-35 MG-MCG tablet 1 tab(s) (Patient not taking: Reported on  10/11/2021)     ondansetron (ZOFRAN) 4 MG tablet Take 1 tablet (4 mg total) by mouth every 6 (six) hours. (Patient not taking: Reported on 10/11/2021) 12 tablet 0   Vitamin D, Ergocalciferol, (DRISDOL) 1.25 MG (50000 UNIT) CAPS capsule 1 cap(s) (Patient not taking: Reported on 10/11/2021)     No current facility-administered medications on file prior to visit.     Allergies: Allergies  Allergen Reactions   No Known Allergies     Family History: Migraine or other headaches in the family:  mother, daughter Aneurysms in a first degree relative:  no Brain tumors in the family:  no Other neurological illness in the family:   no  Past Medical History: Past Medical History:  Diagnosis Date   Allergic rhinitis    Back pain    Common migraine with intractable migraine 12/16/2014   migraines PRN meds   Depression    denies   Dyslipidemia    Eczema    GERD (gastroesophageal reflux disease)    takes meds PRN    Headache    Infection    UTI   Intractable migraine without aura with status migrainosus    Scoliosis    Short interval between pregnancies complicating pregnancy, antepartum 08/20/2018   Supervision of normal pregnancy 01/17/2018   BABYSCRIPTS PATIENT: [x ]Initial [x ]12 [x ]20 [ x]28 [x] 32 [x ]36 [ ] 38 [ ] 39 [ ] 40  Nursing Staff Provider Office Location  [place] Dating  LMP c/w 6 week Language  ENG Anatomy  Wnl, incomplete Flu Vaccine  Declined 12/9 Genetic Screen  NIPS:wnl   AFP:  wnl    TDaP vaccine   info given 12/10/18 Hgb A1C or  GTT Early  Third trimester  Rhogam     LAB RESULTS  Feeding Plan BREAST  Blood Type -   Supervision of other normal pregnancy, antepartum 01/17/2018   BABYSCRIPTS PATIENT: [x ]Initial [x ]12 [x ]20 [ x]28 [x] 32 [x ]36 [ ] 38 [ ] 39 [ ] 40  Nursing Staff Provider Office Location  [place] Dating  LMP c/w 6 week [place] Language  ENG Anatomy 14/9  Wnl, incomplete Flu Vaccine  Declined 12/9 Genetic Screen  NIPS:wnl   AFP:  wnl    TDaP vaccine   info given  12/10/18 Hgb A1C or  GTT Early  Third trimester  Rhogam     LAB RESULTS  Feeding Plan BREAST  Blood Type -   Trichomonal vaginitis 03/17/2020   Vitamin D deficiency     Past Surgical History Past Surgical History:  Procedure Laterality Date   CESAREAN SECTION N/A 01/17/2018   Procedure: CESAREAN SECTION;  Surgeon: , DO;  Location: [facility];  Service: Obstetrics;  Laterality: N/A;   DILATION AND EVACUATION N/A 05/06/2015   Procedure: DILATATION AND EVACUATION;  Surgeon: Edwinna Areolaecilia Worema Banga, DO;  Location: [facility];  Service: Gynecology;  Laterality: N/A;   TUBAL LIGATION Bilateral 02/09/2020   Procedure: POST PARTUM TUBAL LIGATION;  Surgeon: Festus BingPickens, Charlie, MD;  Location: [facility];  Service: Gynecology;  Laterality: Bilateral;    Social History: Social History   Tobacco Use   Smoking status: Never   Smokeless tobacco: Never  Vaping Use   Vaping Use: Never used  Substance Use Topics   Alcohol use: Not Currently    Comment: occasionally   Drug use: No    ROS: Negative for fevers, chills. Positive for headaches. All other systems reviewed and negative unless stated otherwise in HPI.   Physical Exam:   Vital Signs: BP 117/71    Pulse 75    Ht 5\' 2"  (1.575 m)    Wt 195 lb 12.8 oz (88.8 kg)    BMI 35.81 kg/m  GENERAL: well appearing,in no acute distress,alert SKIN:  Color, texture, turgor normal. No rashes or lesions HEAD:  Normocephalic/atraumatic. CV:  RRR RESP: Normal respiratory effort MSK: no tenderness to palpation over occiput, neck, or shoulders  NEUROLOGICAL: Mental Status: Alert, oriented to person, place and time,Follows commands Cranial Nerves: PERRL,visual fields intact to confrontation,extraocular movements intact,facial sensation intact,no facial droop or ptosis,hearing grossly intact,no dysarthria Motor: muscle strength 5/5 both upper and lower extremities,no drift, normal tone Reflexes: 2+ throughout Sensation: intact to light touch  all 4 extremities Coordination: Finger-to- nose-finger intact bilaterally,Heel-to-shin intact bilaterally Gait: normal-based   IMPRESSION: 31 year old female with a history of GERD, vitamin D deficiency, eczema who presents for evaluation of migraines. Her current headache pattern is consistent with chronic migraine without aura. She was unable to tolerate Topamax due to dizziness. Will start propranolol for headache prevention and Imitrex for rescue.  PLAN: -Prevention: Start propranolol 20 mg BID -Rescue: Start Imitrex 100 mg PRN -next steps: consider Botox, CGRP, SNRI for prevention. Consider gepant or ODT/nasal spray triptan for rescue   I spent a total of 23 minutes chart reviewing and counseling the patient. Headache education was done. Discussed treatment options including preventive and acute medications. Discussed medication overuse headache and to limit use of acute treatments to no more than 2 days/week or 10 days/month. Discussed medication side effects, adverse reactions and drug interactions. Written educational materials and patient instructions outlining all of the above were given.  Follow-up: 3 months   Ocie DoyneJennifer Jamoni Broadfoot, MD 10/11/2021   9:54 AM

## 2022-01-17 ENCOUNTER — Ambulatory Visit: Payer: Medicaid Other | Admitting: Psychiatry

## 2022-02-16 ENCOUNTER — Ambulatory Visit: Payer: Medicaid Other | Admitting: Psychiatry

## 2022-10-04 ENCOUNTER — Other Ambulatory Visit: Payer: Self-pay

## 2022-10-04 ENCOUNTER — Encounter (HOSPITAL_COMMUNITY): Payer: Self-pay

## 2022-10-04 ENCOUNTER — Emergency Department (HOSPITAL_COMMUNITY)
Admission: EM | Admit: 2022-10-04 | Discharge: 2022-10-05 | Discharge status: Against Medical Advice | Payer: Medicaid Other | Attending: Emergency Medicine | Admitting: Emergency Medicine

## 2022-10-04 DIAGNOSIS — R519 Headache, unspecified: Secondary | ICD-10-CM | POA: Insufficient documentation

## 2022-10-04 DIAGNOSIS — R112 Nausea with vomiting, unspecified: Secondary | ICD-10-CM | POA: Diagnosis present

## 2022-10-04 DIAGNOSIS — Z5321 Procedure and treatment not carried out due to patient leaving prior to being seen by health care provider: Secondary | ICD-10-CM | POA: Insufficient documentation

## 2022-10-04 DIAGNOSIS — Z20822 Contact with and (suspected) exposure to covid-19: Secondary | ICD-10-CM | POA: Diagnosis not present

## 2022-10-04 DIAGNOSIS — R059 Cough, unspecified: Secondary | ICD-10-CM | POA: Diagnosis not present

## 2022-10-04 DIAGNOSIS — R1084 Generalized abdominal pain: Secondary | ICD-10-CM | POA: Diagnosis not present

## 2022-10-04 DIAGNOSIS — R0989 Other specified symptoms and signs involving the circulatory and respiratory systems: Secondary | ICD-10-CM | POA: Diagnosis not present

## 2022-10-04 LAB — COMPREHENSIVE METABOLIC PANEL
ALT: 17 U/L (ref 0–44)
AST: 22 U/L (ref 15–41)
Albumin: 4.3 g/dL (ref 3.5–5.0)
Alkaline Phosphatase: 56 U/L (ref 38–126)
Anion gap: 10 (ref 5–15)
BUN: 13 mg/dL (ref 6–20)
CO2: 22 mmol/L (ref 22–32)
Calcium: 8.9 mg/dL (ref 8.9–10.3)
Chloride: 106 mmol/L (ref 98–111)
Creatinine, Ser: 0.58 mg/dL (ref 0.44–1.00)
GFR, Estimated: >60 mL/min (ref >60)
Glucose, Bld: 135 mg/dL — ABNORMAL HIGH (ref 70–99)
Potassium: 3.8 mmol/L (ref 3.5–5.1)
Sodium: 138 mmol/L (ref 135–145)
Total Bilirubin: 0.5 mg/dL (ref 0.3–1.2)
Total Protein: 7.7 g/dL (ref 6.5–8.1)

## 2022-10-04 LAB — CBC WITH DIFFERENTIAL/PLATELET
Abs Immature Granulocytes: 0.05 10*3/uL (ref 0.00–0.07)
Basophils Absolute: 0 10*3/uL (ref 0.0–0.1)
Basophils Relative: 0 %
Eosinophils Absolute: 0 10*3/uL (ref 0.0–0.5)
Eosinophils Relative: 0 %
HCT: 41.5 % (ref 36.0–46.0)
Hemoglobin: 14.3 g/dL (ref 12.0–15.0)
Immature Granulocytes: 0 %
Lymphocytes Relative: 5 %
Lymphs Abs: 0.7 10*3/uL (ref 0.7–4.0)
MCH: 31 pg (ref 26.0–34.0)
MCHC: 34.5 g/dL (ref 30.0–36.0)
MCV: 90 fL (ref 80.0–100.0)
Monocytes Absolute: 0.4 10*3/uL (ref 0.1–1.0)
Monocytes Relative: 3 %
Neutro Abs: 12.1 10*3/uL — ABNORMAL HIGH (ref 1.7–7.7)
Neutrophils Relative %: 92 %
Platelets: 258 10*3/uL (ref 150–400)
RBC: 4.61 MIL/uL (ref 3.87–5.11)
RDW: 11.9 % (ref 11.5–15.5)
WBC: 13.3 10*3/uL — ABNORMAL HIGH (ref 4.0–10.5)
nRBC: 0 % (ref 0.0–0.2)

## 2022-10-04 LAB — RESP PANEL BY RT-PCR (RSV, FLU A&B, COVID)  RVPGX2
Influenza A by PCR: NEGATIVE
Influenza B by PCR: NEGATIVE
Resp Syncytial Virus by PCR: NEGATIVE
SARS Coronavirus 2 by RT PCR: NEGATIVE

## 2022-10-04 LAB — I-STAT BETA HCG BLOOD, ED (MC, WL, AP ONLY): I-stat hCG, quantitative: <5 m[IU]/mL (ref <5)

## 2022-10-04 LAB — LIPASE, BLOOD: Lipase: 31 U/L (ref 11–51)

## 2022-10-04 MED ORDER — ONDANSETRON 4 MG PO TBDP
4.0000 mg | ORAL_TABLET | Freq: Once | ORAL | Status: AC
Start: 2022-10-04 — End: 2022-10-04
  Administered 2022-10-04: 4 mg via ORAL
  Filled 2022-10-04: qty 1

## 2022-10-04 MED ORDER — ACETAMINOPHEN 500 MG PO TABS
1000.0000 mg | ORAL_TABLET | Freq: Once | ORAL | Status: AC
  Administered 2022-10-04: 1000 mg via ORAL
  Filled 2022-10-04: qty 2

## 2022-10-04 NOTE — ED Provider Triage Note (Signed)
Emergency Medicine Provider Triage Evaluation Note  Courtney [name] , a 32 y.o. female  was evaluated in triage.  Pt complains of concerns for nausea and emesis. Notes sick contacts at home with similar symptoms. Has rhinorrhea, nasal congestion, cough, generalized abdominal pain, headaches.  Review of Systems  Positive:  Negative:   Physical Exam  BP 121/83   Pulse (!) 123   Temp (!) 100.5 F (38.1 C)   Resp (!) 24   Ht 5\' 2"  (1.575 m)   Wt 88.5 kg   SpO2 100%   BMI 35.67 kg/m  Gen:   Awake, no distress   Resp:  Normal effort  MSK:   Moves extremities without difficulty  Other:  Diffuse abdominal TTP  Medical Decision Making  Medically screening exam initiated at 9:46 PM.  Appropriate orders placed.  Courtney [name] was informed that the remainder of the evaluation will be completed by another provider, this initial triage assessment does not replace that evaluation, and the importance of remaining in the ED until their evaluation is complete.  Work-up initiated.    Shatira Dobosz A, PA-C 10/04/22 2147

## 2022-10-04 NOTE — ED Triage Notes (Signed)
Pt arrives with c/o n/v/d that started today. Per pt, her kids have had GI upset recently. Pt endorses some headaches.

## 2022-10-05 NOTE — ED Notes (Signed)
Pt states they are leaving  

## 2023-02-02 ENCOUNTER — Encounter (HOSPITAL_COMMUNITY): Payer: Self-pay | Admitting: Emergency Medicine

## 2023-02-02 ENCOUNTER — Emergency Department (HOSPITAL_COMMUNITY)
Admission: EM | Admit: 2023-02-02 | Discharge: 2023-02-02 | Disposition: A | Discharge status: Home / Self Care | Payer: Medicaid Other | Attending: Emergency Medicine | Admitting: Emergency Medicine

## 2023-02-02 ENCOUNTER — Emergency Department (HOSPITAL_COMMUNITY): Payer: Medicaid Other

## 2023-02-02 ENCOUNTER — Other Ambulatory Visit: Payer: Self-pay

## 2023-02-02 DIAGNOSIS — J020 Streptococcal pharyngitis: Secondary | ICD-10-CM | POA: Diagnosis not present

## 2023-02-02 DIAGNOSIS — J029 Acute pharyngitis, unspecified: Secondary | ICD-10-CM | POA: Diagnosis present

## 2023-02-02 LAB — CBC WITH DIFFERENTIAL/PLATELET
Abs Immature Granulocytes: 0.09 10*3/uL — ABNORMAL HIGH (ref 0.00–0.07)
Basophils Absolute: 0 10*3/uL (ref 0.0–0.1)
Basophils Relative: 0 %
Eosinophils Absolute: 0.1 10*3/uL (ref 0.0–0.5)
Eosinophils Relative: 1 %
HCT: 37.6 % (ref 36.0–46.0)
Hemoglobin: 12.5 g/dL (ref 12.0–15.0)
Immature Granulocytes: 1 %
Lymphocytes Relative: 26 %
Lymphs Abs: 2.3 10*3/uL (ref 0.7–4.0)
MCH: 30 pg (ref 26.0–34.0)
MCHC: 33.2 g/dL (ref 30.0–36.0)
MCV: 90.2 fL (ref 80.0–100.0)
Monocytes Absolute: 0.5 10*3/uL (ref 0.1–1.0)
Monocytes Relative: 5 %
Neutro Abs: 6.1 10*3/uL (ref 1.7–7.7)
Neutrophils Relative %: 67 %
Platelets: 262 10*3/uL (ref 150–400)
RBC: 4.17 MIL/uL (ref 3.87–5.11)
RDW: 11.9 % (ref 11.5–15.5)
WBC: 9 10*3/uL (ref 4.0–10.5)
nRBC: 0 % (ref 0.0–0.2)

## 2023-02-02 LAB — BASIC METABOLIC PANEL
Anion gap: 10 (ref 5–15)
BUN: 11 mg/dL (ref 6–20)
CO2: 23 mmol/L (ref 22–32)
Calcium: 9.1 mg/dL (ref 8.9–10.3)
Chloride: 104 mmol/L (ref 98–111)
Creatinine, Ser: 0.61 mg/dL (ref 0.44–1.00)
GFR, Estimated: >60 mL/min (ref >60)
Glucose, Bld: 104 mg/dL — ABNORMAL HIGH (ref 70–99)
Potassium: 4.2 mmol/L (ref 3.5–5.1)
Sodium: 137 mmol/L (ref 135–145)

## 2023-02-02 LAB — I-STAT CHEM 8, ED
BUN: 12 mg/dL (ref 6–20)
Calcium, Ion: 1.21 mmol/L (ref 1.15–1.40)
Chloride: 104 mmol/L (ref 98–111)
Creatinine, Ser: 0.4 mg/dL — ABNORMAL LOW (ref 0.44–1.00)
Glucose, Bld: 108 mg/dL — ABNORMAL HIGH (ref 70–99)
HCT: 36 % (ref 36.0–46.0)
Hemoglobin: 12.2 g/dL (ref 12.0–15.0)
Potassium: 4.3 mmol/L (ref 3.5–5.1)
Sodium: 139 mmol/L (ref 135–145)
TCO2: 26 mmol/L (ref 22–32)

## 2023-02-02 LAB — GROUP A STREP BY PCR: Group A Strep by PCR: DETECTED — AB

## 2023-02-02 MED ORDER — CEPHALEXIN 500 MG PO CAPS: 500.0000 mg | ORAL_CAPSULE | Freq: Two times a day (BID) | ORAL | 0 refills | Status: AC

## 2023-02-02 MED ORDER — DEXAMETHASONE SODIUM PHOSPHATE 10 MG/ML IJ SOLN
10.0000 mg | Freq: Once | INTRAMUSCULAR | Status: AC
Start: 2023-02-02 — End: 2023-02-02
  Administered 2023-02-02: 10 mg via INTRAMUSCULAR
  Filled 2023-02-02: qty 1

## 2023-02-02 MED ORDER — IOHEXOL 350 MG/ML SOLN
75.0000 mL | Freq: Once | INTRAVENOUS | Status: AC | PRN
  Administered 2023-02-02: 75 mL via INTRAVENOUS

## 2023-02-02 MED ORDER — IBUPROFEN 400 MG PO TABS
600.0000 mg | ORAL_TABLET | Freq: Once | ORAL | Status: AC
  Administered 2023-02-02: 600 mg via ORAL
  Filled 2023-02-02: qty 1

## 2023-02-02 NOTE — ED Provider Notes (Signed)
Physical Exam  BP 124/87 (BP Location: Right Arm)   Pulse 84   Temp 98.2 F (36.8 C) (Oral)   Resp 19   Wt 88.5 kg   LMP 01/19/2023 (Exact Date)   SpO2 98%   BMI 35.67 kg/m   Physical Exam Vitals and nursing note reviewed.  Constitutional:      Appearance: Normal appearance.  HENT:     Head: Normocephalic and atraumatic.     Mouth/Throat:     Comments: There is mild posterior pharyngeal erythema.  Uvula is slightly deviated to the right but not swollen or edematous.  There is tonsillar hypertrophy bilaterally with the right slightly greater than the left.  Tongue is in normal alignment.  Voice is normal. Eyes:     General:        Right eye: No discharge.        Left eye: No discharge.     Conjunctiva/sclera: Conjunctivae normal.  Pulmonary:     Effort: Pulmonary effort is normal. No respiratory distress.     Breath sounds: Normal air entry.  Skin:    General: Skin is warm and dry.     Findings: No rash.  Neurological:     General: No focal deficit present.     Mental Status: She is alert.  Psychiatric:        Mood and Affect: Mood normal.        Behavior: Behavior normal.     Procedures  Procedures  ED Course / MDM   Clinical Course as of 02/02/23 0924  Thu Feb 02, 2023  4696 Group A Strep by PCR(!): DETECTED [SB]  [phone] Discussed with patient swab results.  Discussed with patient that we can treat with a one-time dose of penicillin emergency department.  Patient notes that she does not want to try penicillin and she was given amoxicillin and it caused her to have a UTI. [SB]  0919 CBC with Differential/Platelet(!) Normal. [CF]  0919 Basic metabolic panel(!) Normal.  [CF]  0919 CT Soft Tissue Neck W Contrast I personally ordered and interpreted this study and do not see any evidence of peritonsillar abscess. I do agree with the radiologist interpretation. [CF]    Clinical Course User Index [CF] Teressa Lower, PA-C [SB] Cyndee Brightly, Sanjuana Letters, PA-C   Medical  Decision Making Courtney [name] is a 32 y.o. female patient who presents to the emergency department today for further evaluation of sore throat that started earlier this morning.  She denies any sick contacts.  There is tonsillar hypertrophy with the right slightly greater than the left.  Patient does have slightly hoarse voice but is breathing normal and is not in any respiratory distress.  Vital signs are normal.  I reviewed the labs and she is strep positive.  Considering the tonsillar hypertrophy a CT neck with contrast was ordered.  Decadron was also ordered in addition to antibiotics.  Offered patient pain medication which she declined at this time.  I will reassess once some of the other labs returned.  Went over all labs and imaging with the patient at the bedside.  CT neck was negative for peritonsillar abscess.  Antibiotics given.  She will follow-up with her primary care doctor.  Strict return precautions were discussed.  She is safe for discharge.  Amount and/or Complexity of Data Reviewed Labs: ordered. Decision-making details documented in ED Course. Radiology: ordered. Decision-making details documented in ED Course.  Risk Prescription drug management.  Honor Loh [place], PA-C 02/02/23 1610    Gerhard Munch, MD 02/02/23 1409

## 2023-02-02 NOTE — ED Notes (Signed)
Patient transported to CT 

## 2023-02-02 NOTE — Discharge Instructions (Addendum)
You tested positive for strep throat today.  This is likely the cause of your symptoms.  As we discussed, there was no evidence of pus collection in the tonsils.  Please take antibiotics as prescribed.  I would like for you to follow-up with your primary care doctor in 1 week to ensure we are going in the right direction.  You may return to the emergency room sooner for any worsening symptoms including trouble breathing, trouble talking, or trouble swallowing.

## 2023-02-02 NOTE — ED Provider Notes (Signed)
[place] EMERGENCY DEPARTMENT AT [facility] Provider Note   CSN: [id number] Arrival date & time: 02/02/23  0358     History  Chief Complaint  Patient presents with   Sore Throat   Swollen Tonsils    Courtney [name] is a 32 y.o. female who was emergency department with concerns for sore throat and swollen tonsils onset prior to arrival.  Denies sick contacts.  Patient was recently treated with ciprofloxacin for UTI with her last dose being 2 days ago.  Patient has associated painful swallowing.  The history is provided by the patient. No language interpreter was used.       Home Medications Prior to Admission medications   Medication Sig Start Date End Date Taking? Authorizing Provider  cephALEXin (KEFLEX) 500 MG capsule Take 1 capsule (500 mg total) by mouth 2 (two) times daily for 10 days. 02/02/23 02/12/23 Yes Dennis Killilea A, PA-C  clobetasol cream (TEMOVATE) 0.05 % Apply topically 2 (two) times daily. 08/23/21   [provider]  gabapentin (NEURONTIN) 300 MG capsule 1 cap(s) Patient not taking: Reported on 10/11/2021    [provider]  naproxen (NAPROSYN) 500 MG tablet Take 500 mg by mouth 2 (two) times daily as needed. 07/26/21   [provider]  norgestimate-ethinyl estradiol (ORTHO-CYCLEN) 0.25-35 MG-MCG tablet 1 tab(s) Patient not taking: Reported on 10/11/2021 06/05/19   [provider]  ondansetron (ZOFRAN) 4 MG tablet Take 1 tablet (4 mg total) by mouth every 6 (six) hours. Patient not taking: Reported on 10/11/2021 09/27/21   Ward, Tylene Fantasia, PA-C  propranolol (INDERAL) 20 MG tablet Take 1 tablet (20 mg total) by mouth 2 (two) times daily. 10/11/21   Ocie Doyne, MD  SUMAtriptan (IMITREX) 100 MG tablet Take 1 tablet (100 mg total) by mouth every 2 (two) hours as needed for migraine. May repeat in 2 hours if headache persists or recurs. 10/11/21   Ocie Doyne, MD  topiramate (TOPAMAX) 25 MG tablet Take 25 mg by mouth  2 (two) times daily. 07/26/21   [provider]  Vitamin D, Ergocalciferol, (DRISDOL) 1.25 MG (50000 UNIT) CAPS capsule 1 cap(s) Patient not taking: Reported on 10/11/2021    [provider]      Allergies    No known allergies    Review of Systems   Review of Systems  All other systems reviewed and are negative.   Physical Exam Updated Vital Signs BP 124/87 (BP Location: Right Arm)   Pulse 84   Temp 98.2 F (36.8 C) (Oral)   Resp 19   Wt 88.5 kg   LMP 01/19/2023 (Exact Date)   SpO2 98%   BMI 35.67 kg/m  Physical Exam Vitals and nursing note reviewed.  Constitutional:      General: She is not in acute distress.    Appearance: Normal appearance.  HENT:     Mouth/Throat:     Mouth: Mucous membranes are moist.     Pharynx: Oropharynx is clear. Uvula midline. Posterior oropharyngeal erythema present. No uvula swelling.     Tonsils: No tonsillar exudate.     Comments: Uvula midline without swelling.  Mild right-sided tonsil swelling greater than left.  No posterior pharyngeal erythema or tonsillar exudate noted. Patent airway. Pt able to speak in clear complete sentences. Tolerating oral secretions. Eyes:     General: No scleral icterus.    Extraocular Movements: Extraocular movements intact.  Cardiovascular:     Rate and Rhythm: Normal rate and regular rhythm.  Pulses: Normal pulses.     Heart sounds: Normal heart sounds.  Pulmonary:     Effort: Pulmonary effort is normal. No respiratory distress.     Breath sounds: Normal breath sounds.  Abdominal:     Palpations: Abdomen is soft. There is no mass.     Tenderness: There is no abdominal tenderness.  Musculoskeletal:        General: Normal range of motion.     Cervical back: Neck supple.  Skin:    General: Skin is warm and dry.     Findings: No rash.  Neurological:     Mental Status: She is alert.     Sensory: Sensation is intact.     Motor: Motor function is intact.  Psychiatric:         Behavior: Behavior normal.     ED Results / Procedures / Treatments   Labs (all labs ordered are listed, but only abnormal results are displayed) Labs Reviewed  GROUP A STREP BY PCR - Abnormal; Notable for the following components:      Result Value   Group A Strep by PCR DETECTED (*)    All other components within normal limits  CBC WITH DIFFERENTIAL/PLATELET  BASIC METABOLIC PANEL  I-STAT CHEM 8, ED    EKG None  Radiology No results found.  Procedures Procedures    Medications Ordered in ED Medications  dexamethasone (DECADRON) injection 10 mg (10 mg Intramuscular Given 02/02/23 0549)  ibuprofen (ADVIL) tablet 600 mg (600 mg Oral Given 02/02/23 0549)    ED Course/ Medical Decision Making/ A&P Clinical Course as of 02/02/23 0639  Thu Feb 02, 2023  0516 Group A Strep by PCR(!): DETECTED [SB]  1610 Discussed with patient swab results.  Discussed with patient that we can treat with a one-time dose of penicillin emergency department.  Patient notes that she does not want to try penicillin and she was given amoxicillin and it caused her to have a UTI. [SB]    Clinical Course User Index [SB] Felicitas Sine A, PA-C                             Medical Decision Making Amount and/or Complexity of Data Reviewed Labs: ordered.  Risk Prescription drug management.     Pt presents with concerns for sore throat onset PTA. Notes that she woke up with the sore throat. Vital signs, pt afebrile. On exam, pt with Uvula midline without swelling.  Mild right-sided tonsil swelling greater than left.  No posterior pharyngeal erythema or tonsillar exudate noted. Patent airway. Pt able to speak in clear complete sentences. Tolerating oral secretions.. No acute cardiovascular, respiratory, abdominal exam findings. Differential diagnosis includes strep pharyngitis, PTA, viral pharyngitis, tonsillitis.   Labs:  I ordered, and personally interpreted labs.  The pertinent results include:    CBC and BMP unremarkable Strep swab positive  Imaging: I ordered imaging studies including CT soft tissue neck ordered with results pending at time of signout  Medications:  I ordered medication including decadron, ibuprofen for symptom management I have reviewed the patients home medicines and have made adjustments as needed  Patient case discussed with Enos Fling, PA-C at sign-out. Plan at sign-out is pending CT, likely Discharge home, however, plans may change as per oncoming team. Patient care transferred at sign out.   This chart was dictated using voice recognition software, Dragon. Despite the best efforts of this provider to proofread and correct errors,  errors may still occur which can change documentation meaning.   Final Clinical Impression(s) / ED Diagnoses Final diagnoses:  Strep pharyngitis    Rx / DC Orders ED Discharge Orders          Ordered    cephALEXin (KEFLEX) 500 MG capsule  2 times daily        02/02/23 0639              Andrae Claunch A, PA-C 02/02/23 1610    Shon Baton, MD 02/02/23 [phone]

## 2023-02-02 NOTE — ED Triage Notes (Addendum)
Pt in with swollen tonsils and sore throat, noticed when she first woke up tonight. R tonsil swollen greater than L. Denies any fevers or sob, it is painful to swallow

## 2023-03-07 ENCOUNTER — Ambulatory Visit: Payer: Self-pay | Admitting: Surgery

## 2023-04-28 ENCOUNTER — Other Ambulatory Visit: Payer: Self-pay | Admitting: Physician Assistant

## 2023-04-28 DIAGNOSIS — N6320 Unspecified lump in the left breast, unspecified quadrant: Secondary | ICD-10-CM

## 2023-05-24 ENCOUNTER — Other Ambulatory Visit: Payer: Self-pay | Admitting: Physician Assistant

## 2023-05-24 ENCOUNTER — Ambulatory Visit
Admission: RE | Admit: 2023-05-24 | Discharge: 2023-05-24 | Disposition: A | Discharge status: Home / Self Care | Payer: Medicaid Other | Source: Ambulatory Visit | Attending: Physician Assistant | Admitting: Physician Assistant

## 2023-05-24 DIAGNOSIS — N6320 Unspecified lump in the left breast, unspecified quadrant: Secondary | ICD-10-CM

## 2023-05-24 DIAGNOSIS — N6489 Other specified disorders of breast: Secondary | ICD-10-CM

## 2023-11-22 ENCOUNTER — Ambulatory Visit: Payer: Medicaid Other

## 2023-11-22 ENCOUNTER — Ambulatory Visit
Admission: RE | Admit: 2023-11-22 | Discharge: 2023-11-22 | Disposition: A | Discharge status: Home / Self Care | Payer: Medicaid Other | Source: Ambulatory Visit | Attending: Physician Assistant | Admitting: Physician Assistant

## 2023-11-22 DIAGNOSIS — N6489 Other specified disorders of breast: Secondary | ICD-10-CM

## 2023-11-23 ENCOUNTER — Other Ambulatory Visit: Payer: Self-pay | Admitting: Physician Assistant

## 2023-11-23 DIAGNOSIS — N6489 Other specified disorders of breast: Secondary | ICD-10-CM

## 2024-05-15 ENCOUNTER — Encounter: Payer: Self-pay | Admitting: Dermatology

## 2024-05-15 ENCOUNTER — Ambulatory Visit: Admitting: Dermatology

## 2024-05-15 VITALS — BP 113/68

## 2024-05-15 DIAGNOSIS — L7211 Pilar cyst: Secondary | ICD-10-CM | POA: Diagnosis not present

## 2024-05-15 NOTE — Patient Instructions (Addendum)
 Date: Wed May 15 2024  Hello  Courtney [name],  Thank you for visiting today. Here is a summary of the key instructions:  - Referral:   - A referral has been sent to Dr. Lowery, a plastic surgeon, for removal of pilar cysts   - Call Dr. Ace office tomorrow to schedule your appointment  - Procedure Information:   - Dr. Lowery will remove one cyst at a time   - The procedure involves cleaning the area, injecting numbing medicine, making a small cut, removing the cyst, and closing the cut  - After the Procedure:   - It takes about 2 weeks to fully heal   - You may have stitches that need removal or will dissolve on their own   - Avoid swimming while healing   - Hair should grow back over time, except on the tiny scar line   - You may feel a small dip where the cyst was, but it should even out over a couple of months  - Follow-up:   - Dr. Lowery will provide more details about your specific procedure and care  Please reach out if you have any questions or concerns.  Warm regards,  Dr. [facility]     [name], DO [facility] [address] [place], [place] [address] [phone]  Important Information  Due to recent changes in healthcare laws, you may see results of your pathology and/or laboratory studies on MyChart before the doctors have had a chance to review them. We understand that in some cases there may be results that are confusing or concerning to you. Please understand that not all results are received at the same time and often the doctors may need to interpret multiple results in order to provide you with the best plan of care or course of treatment. Therefore, we ask that you please give us  2 business days to thoroughly review all your results before contacting the office for clarification. Should we see a critical lab result, you will be contacted sooner.   If You Need Anything After Your  Visit  If you have any questions or concerns for your doctor, please call our main line at [phone] If no one answers, please leave a voicemail as directed and we will return your call as soon as possible. Messages left after 4 pm will be answered the following business day.   You may also send us  a message via MyChart. We typically respond to MyChart messages within 1-2 business days.  For prescription refills, please ask your pharmacy to contact our office. Our fax number is [phone].  If you have an urgent issue when the clinic is closed that cannot wait until the next business day, you can page your doctor at the number below.    Please note that while we do our best to be available for urgent issues outside of office hours, we are not available 24/7.   If you have an urgent issue and are unable to reach us , you may choose to seek medical care at your doctor's office, retail clinic, urgent care center, or emergency room.  If you have a medical emergency, please immediately call 911 or go to the emergency department. In the event of inclement weather, please call our main line at [phone] for an update on the status of any delays or closures.  Dermatology Medication Tips: Please keep the boxes that topical medications come in in order to help keep track  of the instructions about where and how to use these. Pharmacies typically print the medication instructions only on the boxes and not directly on the medication tubes.   If your medication is too expensive, please contact our office at [phone] or send us  a message through MyChart.   We are unable to tell what your co-pay for medications will be in advance as this is different depending on your insurance coverage. However, we may be able to find a substitute medication at lower cost or fill out paperwork to get insurance to cover a needed medication.   If a prior authorization is required to get your medication covered by  your insurance company, please allow us  1-2 business days to complete this process.  Drug prices often vary depending on where the prescription is filled and some pharmacies may offer cheaper prices.  The website www.goodrx.com contains coupons for medications through different pharmacies. The prices here do not account for what the cost may be with help from insurance (it may be cheaper with your insurance), but the website can give you the price if you did not use any insurance.  - You can print the associated coupon and take it with your prescription to the pharmacy.  - You may also stop by our office during regular business hours and pick up a GoodRx coupon card.  - If you need your prescription sent electronically to a different pharmacy, notify our office through [facility] or by phone at [phone]

## 2024-05-15 NOTE — Progress Notes (Signed)
   New Patient Visit   Subjective  Courtney [name] is a 33 y.o. female who presents for a NEW PATIENT appointment to be examined for the concerns as listed below.   Scalp Cyst: Patient stated her first one came up 10 years ago resolving on its own and the last time about 2 years ago which has decreased in size but still remains in the area. She is looking for a regimen to prevent them and to discuss having the ones that remain removed.    Are you nursing, pregnant or trying to conceive? No   Patient denied Hx of Bx. Patient denied family Hx of skin cancer .   The following portions of the chart were reviewed this encounter and updated as appropriate: medications, allergies, medical history  Review of Systems:  No other skin or systemic complaints except as noted in HPI or Assessment and Plan.  Objective  Well appearing patient in no apparent distress; mood and affect are within normal limits.   A focused examination was performed of the following areas: scalp   Relevant exam findings are noted in the Assessment and Plan.    Assessment & Plan   PILAR  CYST Exam:  L frontal scalp - 2cm subcutaneous cystic nodule firm & freely mobile R partietal scalp - 3cm subcutaneous cystic nodule firm & freely mobile  Benign-appearing. Exam most consistent with an epidermal inclusion cyst. Discussed that a cyst is a benign growth that can grow over time and sometimes get irritated or inflamed. Recommend observation if it is not bothersome. Discussed option of surgical excision to remove it if it is growing, symptomatic, or other changes noted. Please call for new or changing lesions so they can be evaluated.  Treatment Plan: - Referral sent to plastic surgery - Dr. Lowery for removal    PILAR CYST   Related Procedures Ambulatory referral to Plastic Surgery  Return if symptoms worsen or fail to improve.   Documentation: I have reviewed the above documentation for accuracy  and completeness, and I agree with the above.  I, Braylea Brancato Maranda, CMA, am acting as scribe for [employer], DO.   Delon Lenis, DO

## 2024-05-20 ENCOUNTER — Ambulatory Visit: Admitting: Plastic Surgery

## 2024-05-20 ENCOUNTER — Encounter: Payer: Self-pay | Admitting: Plastic Surgery

## 2024-05-20 VITALS — BP 123/85 | HR 77 | Ht 62.0 in | Wt 191.0 lb

## 2024-05-20 DIAGNOSIS — L7211 Pilar cyst: Secondary | ICD-10-CM | POA: Insufficient documentation

## 2024-05-20 NOTE — Progress Notes (Signed)
 Patient ID: Courtney [name], female    DOB: [birth year], 33 y.o.   MRN: [id number]   Chief Complaint  Patient presents with   Advice Only   Skin Problem    The patient is a 33 year old female here for evaluation of her scalp.  She is 5 feet 2 inches tall and weighs 191 pounds.  She has 2 areas on her scalp that are swollen.  She has been told that they are cysts.  They have been getting larger over the past couple of years.  The one in the back right portion of her scalp is 35 years old.  It is getting larger and is about 2 cm in size.  The one that is in the front left area of her scalp is 1.2 cm in size and has been there for a couple of years.  It is also tender.  She has not had any treatment in the past for these.    Review of Systems  Constitutional: Negative.   Eyes: Negative.   Respiratory: Negative.    Cardiovascular: Negative.   Gastrointestinal: Negative.   Endocrine: Negative.   Genitourinary: Negative.   Musculoskeletal: Negative.     Past Medical History:  Diagnosis Date   Allergic rhinitis    Back pain    Common migraine with intractable migraine 12/16/2014   migraines PRN meds   Depression    denies   Dyslipidemia    Eczema    GERD (gastroesophageal reflux disease)    takes meds PRN    Headache    Infection    UTI   Intractable migraine without aura with status migrainosus    Scoliosis    Short interval between pregnancies complicating pregnancy, antepartum 08/20/2018   Supervision of normal pregnancy 01/17/2018   BABYSCRIPTS PATIENT: [x ]Initial [x ]12 [x ]20 [ x]28 [x] 32 [x ]36 [ ] 38 [ ] 39 [ ] 40  Nursing Staff Provider Office Location  [facility] Dating  LMP c/w 6 week US  Language  ENG Anatomy US   Wnl, incomplete Flu Vaccine  Declined 12/9 Genetic Screen  NIPS:wnl   AFP:  wnl    TDaP vaccine   info given 12/10/18 Hgb A1C or  GTT Early  Third trimester  Rhogam     LAB RESULTS  Feeding Plan BREAST  Blood Type -   Supervision of other normal pregnancy,  antepartum 01/17/2018   BABYSCRIPTS PATIENT: [x ]Initial [x ]12 [x ]20 [ x]28 [x] 32 [x ]36 [ ] 38 [ ] 39 [ ] 40  Nursing Staff Provider Office Location  [facility] Dating  LMP c/w 6 week US  Language  ENG Anatomy US   Wnl, incomplete Flu Vaccine  Declined 12/9 Genetic Screen  NIPS:wnl   AFP:  wnl    TDaP vaccine   info given 12/10/18 Hgb A1C or  GTT Early  Third trimester  Rhogam     LAB RESULTS  Feeding Plan BREAST  Blood Type -   Trichomonal vaginitis 03/17/2020   Vitamin D deficiency     Past Surgical History:  Procedure Laterality Date   CESAREAN SECTION N/A 01/17/2018   Procedure: CESAREAN SECTION;  Surgeon: Lilton Legions, DO;  Location: [facility];  Service: Obstetrics;  Laterality: N/A;   DILATION AND EVACUATION N/A 05/06/2015   Procedure: DILATATION AND EVACUATION;  Surgeon: Ted Rogena Solo, DO;  Location: [facility] ORS;  Service: Gynecology;  Laterality: N/A;   TUBAL LIGATION Bilateral 02/09/2020   Procedure: POST PARTUM TUBAL LIGATION;  Surgeon: Izell Harari, MD;  Location:  [facility] ORS;  Service: Gynecology;  Laterality: Bilateral;      Current Outpatient Medications:    clobetasol cream (TEMOVATE) 0.05 %, Apply topically 2 (two) times daily., Disp: , Rfl:    gabapentin (NEURONTIN) 300 MG capsule, 1 cap(s), Disp: , Rfl:    naproxen (NAPROSYN) 500 MG tablet, Take 500 mg by mouth 2 (two) times daily as needed., Disp: , Rfl:    norgestimate -ethinyl estradiol  (ORTHO-CYCLEN) 0.25-35 MG-MCG tablet, 1 tab(s), Disp: , Rfl:    ondansetron  (ZOFRAN ) 4 MG tablet, Take 1 tablet (4 mg total) by mouth every 6 (six) hours., Disp: 12 tablet, Rfl: 0   propranolol  (INDERAL ) 20 MG tablet, Take 1 tablet (20 mg total) by mouth 2 (two) times daily., Disp: 60 tablet, Rfl: 3   SUMAtriptan  (IMITREX ) 100 MG tablet, Take 1 tablet (100 mg total) by mouth every 2 (two) hours as needed for migraine. May repeat in 2 hours if headache persists or recurs., Disp: 10 tablet, Rfl: 3   topiramate  (TOPAMAX ) 25 MG tablet,  Take 25 mg by mouth 2 (two) times daily., Disp: , Rfl:    Vitamin D, Ergocalciferol, (DRISDOL) 1.25 MG (50000 UNIT) CAPS capsule, 1 cap(s), Disp: , Rfl:    Objective:   Vitals:   05/20/24 1045  BP: 123/85  Pulse: 77  SpO2: 97%    Physical Exam Vitals reviewed.  Constitutional:      Appearance: Normal appearance.  Cardiovascular:     Rate and Rhythm: Normal rate.  Musculoskeletal:        General: Tenderness present.  Skin:    Capillary Refill: Capillary refill takes less than 2 seconds.     Coloration: Skin is not jaundiced.     Findings: Lesion present. No bruising.  Neurological:     Mental Status: She is alert and oriented to person, place, and time.  Psychiatric:        Mood and Affect: Mood normal.        Behavior: Behavior normal.        Thought Content: Thought content normal.        Judgment: Judgment normal.     Assessment & Plan:  Pilar cyst  Plan for excision of 2 pilar cysts.  We will try to do this here in the office.  Pictures were obtained of the patient and placed in the chart with the patient's or guardian's permission.   Courtney RAMAN Colby Reels, DO

## 2024-05-23 ENCOUNTER — Ambulatory Visit: Admitting: Plastic Surgery

## 2024-05-23 VITALS — BP 115/75 | HR 92

## 2024-05-23 DIAGNOSIS — L7211 Pilar cyst: Secondary | ICD-10-CM

## 2024-05-23 NOTE — Progress Notes (Signed)
 Procedure Note  Preoperative Dx: Scalp sebaceous / pilar cyst  Postoperative Dx: Same  Procedure: Excision of scalp sebaceous cyst 2 cm  Anesthesia: Lidocaine  1% with 1:100,000 epinephrine   Indication for Procedure: cyst  Description of Procedure: Risks and complications were explained to the patient.  Consent was confirmed and the patient understands the risks and benefits.  The potential complications and alternatives were explained and the patient consents.  The patient expressed understanding the option of not having the procedure and the risks of a scar.  Time out was called and all information was confirmed to be correct.    The area was prepped and drapped.  Lidocaine  1% with epinephrine  was injected in the subcutaneous area.  After waiting several minutes for the local to take affect a #15 blade was used to incise the skin over the lesion.  The tissue scissors were used to free the sac from the surrounding tissue  it was removed completely.  The skin edges were reapproximated with 5-0 Monocryl.  A dressing was applied.  The patient was given instructions on how to care for the area and a follow up appointment.  Courtney [name] tolerated the procedure well and there were no complications.

## 2024-05-28 ENCOUNTER — Ambulatory Visit
Admission: RE | Admit: 2024-05-28 | Discharge: 2024-05-28 | Disposition: A | Discharge status: Home / Self Care | Source: Ambulatory Visit | Attending: Physician Assistant | Admitting: Physician Assistant

## 2024-05-28 DIAGNOSIS — N6489 Other specified disorders of breast: Secondary | ICD-10-CM

## 2024-06-06 ENCOUNTER — Ambulatory Visit (INDEPENDENT_AMBULATORY_CARE_PROVIDER_SITE_OTHER): Payer: Self-pay | Admitting: Surgical

## 2024-06-06 VITALS — BP 114/77 | HR 83

## 2024-06-06 DIAGNOSIS — L7211 Pilar cyst: Secondary | ICD-10-CM

## 2024-06-06 NOTE — Progress Notes (Signed)
 33 year old female here for follow-up after excision of scalp cyst. Patient reports she is doing well.  She is not really having any issues other than noticing the suture getting caught in her starleen or when she is washing her hair.  She has no concerns related to infection or any other issues.  Scalp incision is intact healing well.  Monocryl suture is noted.  No erythema or cellulitic changes.  A/P:  Monocryl suture knots removed, patient tolerated this well.  Recommend following up as needed.  Patient confirmed pathology was not sent as agreed upon with Dr. Lowery during day of procedure. We did discuss that cyst can recur  Recommend calling with questions or concerns, follow-up as needed.

## 2024-07-23 ENCOUNTER — Ambulatory Visit: Admitting: Plastic Surgery

## 2024-07-23 ENCOUNTER — Encounter: Payer: Self-pay | Admitting: Plastic Surgery

## 2024-07-23 DIAGNOSIS — L7211 Pilar cyst: Secondary | ICD-10-CM

## 2024-07-23 NOTE — Progress Notes (Signed)
 Procedure Note  Preoperative Dx: Scalp cyst  Postoperative Dx: Same  Procedure: Excision of scalp cyst 1.5 cm  Anesthesia: Lidocaine  1% with 1:100,000 epinephrine   Indication for Procedure: Cyst  Description of Procedure: Risks and complications were explained to the patient.  Consent was confirmed and the patient understands the risks and benefits.  The potential complications and alternatives were explained and the patient consents.  The patient expressed understanding the option of not having the procedure and the risks of a scar.  Time out was called and all information was confirmed to be correct.    The area was prepped and drapped.  Lidocaine  1% with epinephrine  was injected in the subcutaneous area.  After waiting several minutes for the local to take affect a #15 blade was used to incise the skin over the area.  The cyst and entire sac was able to be removed.  The skin edges were reapproximated with 5-0 Monocryl.  The patient was given instructions on how to care for the area and a follow up appointment.  Courtney [name] tolerated the procedure well and there were no complications.

## 2024-08-01 ENCOUNTER — Ambulatory Visit: Admitting: Student

## 2024-08-01 DIAGNOSIS — L7211 Pilar cyst: Secondary | ICD-10-CM

## 2024-08-01 NOTE — Progress Notes (Signed)
 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  She recently underwent excision of scalp cyst with Dr. Lowery on 07/23/2024.  Patient is a little over 1 week out from her procedure.  She presents to the clinic today for postprocedural follow-up.  Per chart review, it does not appear that cyst was sent to pathology.  Today, patient reports she is doing well.  She states that for the first day or so, the area was swollen, but it has gone down.  She states that there is a small hump there.  She denies any drainage from the area.  She denies any significant pain.  She denies any fevers or chills.  On exam, patient is sitting upright in no acute distress.  Monocryl sutures are in place to the incision.  Wound edges do not appear to be completely approximated, there seems to be a slight opening.  Skin around the incision appears to be mildly irritated.  There is no active drainage on exam.  No significant tenderness to palpation.  Given that there is a slight opening, recommended that we leave sutures in place for another week or so.  Patient was in agreement with this.  Recommended that she apply a small amount of Vaseline to the area and continue to monitor it.  I discussed with her that if she develops any worsening swelling, redness, fevers, chills, pain she should call us  right away.  Patient expressed understanding.  Will see the patient back in about a week or so.  Instructed her to call with any questions or concerns.  Pictures were obtained of the patient and placed in the chart with the patient's or guardian's permission.

## 2024-08-02 ENCOUNTER — Institutional Professional Consult (permissible substitution): Admitting: Plastic Surgery

## 2024-08-16 ENCOUNTER — Ambulatory Visit: Admitting: Plastic Surgery

## 2024-08-19 ENCOUNTER — Encounter: Payer: Self-pay | Admitting: Plastic Surgery

## 2024-08-19 ENCOUNTER — Ambulatory Visit: Admitting: Plastic Surgery

## 2024-08-19 VITALS — BP 116/62 | HR 69 | Ht 62.0 in | Wt 185.0 lb

## 2024-08-19 DIAGNOSIS — Z09 Encounter for follow-up examination after completed treatment for conditions other than malignant neoplasm: Secondary | ICD-10-CM | POA: Diagnosis not present

## 2024-08-19 DIAGNOSIS — L7211 Pilar cyst: Secondary | ICD-10-CM

## 2024-08-19 NOTE — Progress Notes (Signed)
 The patient is a 33 year old female here for follow-up after undergoing a cyst excision.  And we did not end up sending this to pathology.  The area is healing really well and she looks good.  I would leave the stitches alone for now.  If they are still there in a week or so we can certainly remove them.  In the meantime she should just be careful about using a comb on her scalp.

## 2024-10-24 ENCOUNTER — Ambulatory Visit: Admitting: Dermatology
